# Patient Record
Sex: Female | Born: 1940 | Race: Black or African American | Hispanic: No | Smoking: Never smoker
Health system: Southern US, Community
[De-identification: ages and names within clinical notes are randomized; demographics above are authoritative.]

## PROBLEM LIST (undated history)

## (undated) DIAGNOSIS — H8110 Benign paroxysmal vertigo, unspecified ear: Secondary | ICD-10-CM

## (undated) DIAGNOSIS — E669 Obesity, unspecified: Secondary | ICD-10-CM

## (undated) DIAGNOSIS — K219 Gastro-esophageal reflux disease without esophagitis: Secondary | ICD-10-CM

## (undated) DIAGNOSIS — R718 Other abnormality of red blood cells: Secondary | ICD-10-CM

## (undated) DIAGNOSIS — I10 Essential (primary) hypertension: Secondary | ICD-10-CM

## (undated) DIAGNOSIS — M1990 Unspecified osteoarthritis, unspecified site: Secondary | ICD-10-CM

## (undated) DIAGNOSIS — I739 Peripheral vascular disease, unspecified: Secondary | ICD-10-CM

## (undated) DIAGNOSIS — E785 Hyperlipidemia, unspecified: Secondary | ICD-10-CM

## (undated) DIAGNOSIS — G4700 Insomnia, unspecified: Secondary | ICD-10-CM

## (undated) HISTORY — DX: Hyperlipidemia, unspecified: E78.5

## (undated) HISTORY — DX: Benign paroxysmal vertigo, unspecified ear: H81.10

## (undated) HISTORY — DX: Peripheral vascular disease, unspecified: I73.9

## (undated) HISTORY — DX: Essential (primary) hypertension: I10

## (undated) HISTORY — DX: Gastro-esophageal reflux disease without esophagitis: K21.9

## (undated) HISTORY — DX: Unspecified osteoarthritis, unspecified site: M19.90

## (undated) HISTORY — DX: Other abnormality of red blood cells: R71.8

## (undated) HISTORY — DX: Insomnia, unspecified: G47.00

## (undated) HISTORY — DX: Obesity, unspecified: E66.9

---

## 1969-04-22 HISTORY — PX: TOTAL ABDOMINAL HYSTERECTOMY: SHX209

## 1978-04-22 HISTORY — PX: ABLATION ON ENDOMETRIOSIS: SHX5787

## 1987-04-23 HISTORY — PX: CHOLECYSTECTOMY, LAPAROSCOPIC: SHX56

## 1997-08-03 ENCOUNTER — Ambulatory Visit (HOSPITAL_COMMUNITY): Admission: RE | Admit: 1997-08-03 | Discharge: 1997-08-03 | Payer: Self-pay | Admitting: Sports Medicine

## 1999-05-07 ENCOUNTER — Other Ambulatory Visit: Admission: RE | Admit: 1999-05-07 | Discharge: 1999-05-07 | Payer: Self-pay | Admitting: Gynecology

## 1999-06-27 ENCOUNTER — Encounter: Admission: RE | Admit: 1999-06-27 | Discharge: 1999-06-27 | Payer: Self-pay | Admitting: Sports Medicine

## 1999-06-27 ENCOUNTER — Encounter: Payer: Self-pay | Admitting: Sports Medicine

## 1999-07-05 ENCOUNTER — Ambulatory Visit (HOSPITAL_COMMUNITY): Admission: RE | Admit: 1999-07-05 | Discharge: 1999-07-05 | Payer: Self-pay | Admitting: Sports Medicine

## 1999-07-05 ENCOUNTER — Encounter: Payer: Self-pay | Admitting: Sports Medicine

## 2001-11-13 ENCOUNTER — Ambulatory Visit (HOSPITAL_COMMUNITY): Admission: RE | Admit: 2001-11-13 | Discharge: 2001-11-13 | Payer: Self-pay | Admitting: Gastroenterology

## 2001-11-13 ENCOUNTER — Encounter (INDEPENDENT_AMBULATORY_CARE_PROVIDER_SITE_OTHER): Payer: Self-pay | Admitting: Specialist

## 2002-10-11 ENCOUNTER — Encounter: Payer: Self-pay | Admitting: Allergy and Immunology

## 2002-10-11 ENCOUNTER — Encounter: Admission: RE | Admit: 2002-10-11 | Discharge: 2002-10-11 | Payer: Self-pay | Admitting: *Deleted

## 2002-10-15 ENCOUNTER — Encounter: Admission: RE | Admit: 2002-10-15 | Discharge: 2002-10-15 | Payer: Self-pay | Admitting: *Deleted

## 2002-10-15 ENCOUNTER — Encounter: Payer: Self-pay | Admitting: Allergy and Immunology

## 2002-11-02 ENCOUNTER — Other Ambulatory Visit: Admission: RE | Admit: 2002-11-02 | Discharge: 2002-11-02 | Payer: Self-pay | Admitting: Gynecology

## 2002-12-20 ENCOUNTER — Encounter: Payer: Self-pay | Admitting: Allergy and Immunology

## 2002-12-20 ENCOUNTER — Encounter: Admission: RE | Admit: 2002-12-20 | Discharge: 2002-12-20 | Payer: Self-pay | Admitting: *Deleted

## 2003-06-17 ENCOUNTER — Encounter: Admission: RE | Admit: 2003-06-17 | Discharge: 2003-06-17 | Payer: Self-pay | Admitting: Allergy and Immunology

## 2004-07-26 ENCOUNTER — Encounter: Admission: RE | Admit: 2004-07-26 | Discharge: 2004-07-26 | Payer: Self-pay | Admitting: Allergy and Immunology

## 2004-09-26 ENCOUNTER — Encounter: Admission: RE | Admit: 2004-09-26 | Discharge: 2004-09-26 | Payer: Self-pay | Admitting: Sports Medicine

## 2006-01-18 ENCOUNTER — Encounter: Admission: RE | Admit: 2006-01-18 | Discharge: 2006-01-18 | Payer: Self-pay | Admitting: Sports Medicine

## 2006-04-22 HISTORY — PX: COLONOSCOPY: SHX174

## 2006-07-07 ENCOUNTER — Encounter: Admission: RE | Admit: 2006-07-07 | Discharge: 2006-07-07 | Payer: Self-pay | Admitting: Sports Medicine

## 2008-03-27 ENCOUNTER — Encounter: Admission: RE | Admit: 2008-03-27 | Discharge: 2008-03-27 | Payer: Self-pay | Admitting: Sports Medicine

## 2011-04-23 HISTORY — PX: COLONOSCOPY: SHX174

## 2011-04-23 LAB — HM COLONOSCOPY

## 2013-08-04 ENCOUNTER — Encounter: Payer: Self-pay | Admitting: Neurology

## 2013-08-05 ENCOUNTER — Ambulatory Visit (INDEPENDENT_AMBULATORY_CARE_PROVIDER_SITE_OTHER): Payer: Medicare Other | Admitting: Neurology

## 2013-08-05 ENCOUNTER — Encounter: Payer: Self-pay | Admitting: Neurology

## 2013-08-05 VITALS — BP 132/78 | HR 61 | Temp 98.2°F | Ht 59.5 in | Wt 187.0 lb

## 2013-08-05 DIAGNOSIS — R0683 Snoring: Secondary | ICD-10-CM

## 2013-08-05 DIAGNOSIS — R0902 Hypoxemia: Secondary | ICD-10-CM

## 2013-08-05 DIAGNOSIS — R0609 Other forms of dyspnea: Secondary | ICD-10-CM

## 2013-08-05 DIAGNOSIS — G4726 Circadian rhythm sleep disorder, shift work type: Secondary | ICD-10-CM

## 2013-08-05 DIAGNOSIS — G4734 Idiopathic sleep related nonobstructive alveolar hypoventilation: Secondary | ICD-10-CM

## 2013-08-05 DIAGNOSIS — E669 Obesity, unspecified: Secondary | ICD-10-CM

## 2013-08-05 DIAGNOSIS — R0989 Other specified symptoms and signs involving the circulatory and respiratory systems: Secondary | ICD-10-CM

## 2013-08-05 DIAGNOSIS — R351 Nocturia: Secondary | ICD-10-CM

## 2013-08-05 NOTE — Patient Instructions (Signed)
Based on your symptoms and your exam I believe you are at risk for obstructive sleep apnea or OSA, and I think we should proceed with a sleep study to determine whether you do or do not have OSA and how severe it is. If you have more than mild OSA, I want you to consider treatment with CPAP. Please remember, the risks and ramifications of moderate to severe obstructive sleep apnea or OSA are: Cardiovascular disease, including congestive heart failure, stroke, difficult to control hypertension, arrhythmias, and even type 2 diabetes has been linked to untreated OSA. Sleep apnea causes disruption of sleep and sleep deprivation in most cases, which, in turn, can cause recurrent headaches, problems with memory, mood, concentration, focus, and vigilance. Most people with untreated sleep apnea report excessive daytime sleepiness, which can affect their ability to drive. Please do not drive if you feel sleepy.  I will see you back after your sleep study to go over the test results and where to go from there. We will call you after your sleep study and to set up an appointment at the time.   You can bring tylenol PM for your sleep study.

## 2013-08-05 NOTE — Progress Notes (Signed)
Subjective:    Patient ID: Crystal Preston is a 73 y.o. female.  HPI    Huston FoleySaima Herschel Fleagle, MD, PhD [facility] [address], [address] [address] [place], [place] [address]  Dear Dr. Eloise HarmanPaterson,   I saw your patient, Crystal Preston, upon your kind request in my neurologic clinic today for initial consultation of her sleep disorder, in particular, concern for obstructive sleep apnea. The patient is unaccompanied today. As you know, Crystal Preston is a very pleasant 73 year old right-handed woman with an underlying medical history of hyperlipidemia, osteoarthritis, reflux disease, chronic low back pain, positional vertigo, who complains of snoring, sleep disruption, nocturia, sleep consolidation problems and non-restorative sleep. She had a recent overnight pulse oximetry test on 07/12/2013 which I reviewed: Her baseline oxygen saturation on room air was 93.6%, her lowest oxygen saturation was 76%, time below 88% saturation was 6 minutes for the night. In reviewing the graph, it does appear that she had some cyclical desaturations.  Her typical bedtime is reported to be around 11:30 PM and usual wake time is around 10 AM. Sleep onset typically occurs after 1-2 hours. Of note, she used to work 3rd shift for 20 years, up until 2 years ago. 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 She does not smoke or drink alcohol. She lives alone and her husband of 22 years died some 9 years ago. She has 2 grown children, both local, from her first marriage (he died with a brain aneurysm rupture). She drives a car and has not had issues driving. She reports feeling marginally rested upon awakening. She wakes up on an average 2 times in the middle of the night and has to go to the bathroom 1 to 2 times on a typical night. She denies morning headaches.  She denies excessive daytime somnolence, but has physical tiredness and Her Epworth Sleepiness Score (ESS) is 3/24 today. She has not fallen  asleep while driving. The patient has not been taking a planned nap.  She has been known to snore for the past few years. Snoring is reportedly mild, and it is unclear if it is associated with choking sounds and witnessed apneas. The patient denies a sense of choking or strangling feeling. There is no report of nighttime reflux, with no nighttime cough experienced. The patient has noted occasional RLS symptoms and is not known to kick while asleep or before falling asleep. There is no family history of RLS or OSA.  She is a restless sleeper and in the morning, the bed is quite disheveled.   She denies cataplexy, sleep paralysis, hypnagogic or hypnopompic hallucinations, or sleep attacks. She does not report any vivid dreams, nightmares, dream enactments, or parasomnias, such as sleep talking or sleep walking. The patient has not had a sleep study or a home sleep test.  She consumes 1 caffeinated beverages per day, usually in the form of coffee.  Her bedroom is usually dark and cool, however, the TV stays on. She has no pets.   Her Past Medical History Is Significant For: Past Medical History  Diagnosis Date  . HTN (hypertension)   . Hyperlipidemia   . Osteoarthritis   . GERD (gastroesophageal reflux disease)   . Vertigo, benign positional     Head to left side  . Obesity, unspecified   . Peripheral vascular disease, unspecified   . Other abnormality of red blood cells     Microcytosis  . Insomnia, unspecified  Her Past Surgical History Is Significant For: Past Surgical History  Procedure Laterality Date  . Total abdominal hysterectomy  1971  . Ablation on endometriosis  1980  . Cholecystectomy, laparoscopic  1989  . Colonoscopy  2008    Her Family History Is Significant For: Family History  Problem Relation Age of Onset  . Cancer Father   . Cancer Mother     Her Social History Is Significant For: History   Social History  . Marital Status: Widowed    Spouse Name: N/A     Number of Children: N/A  . Years of Education: N/A   Social History Main Topics  . Smoking status: Never Smoker   . Smokeless tobacco: None  . Alcohol Use: No  . Drug Use: No  . Sexual Activity: None   Other Topics Concern  . None   Social History Narrative  . None    Her Allergies Are:  Allergies  Allergen Reactions  . Azithromycin Other (See Comments)    headache, excessive sleepiness  :   Her Current Medications Are:  Outpatient Encounter Prescriptions as of 08/05/2013  Medication Sig  . aspirin 81 MG tablet Take 81 mg by mouth daily.  . calcium-vitamin D (OSCAL WITH D) 500-200 MG-UNIT per tablet Take 2 tablets by mouth daily with breakfast.  . Cholecalciferol (VITAMIN D-3) 1000 UNITS CAPS Take 1 capsule by mouth daily.  . ferrous sulfate 325 (65 FE) MG EC tablet Take 325 mg by mouth daily with breakfast.  . ibandronate (BONIVA) 150 MG tablet Take 150 mg by mouth daily. Take in the morning with a full glass of water, on an empty stomach, and do not take anything else by mouth or lie down for the next 30 min.  Marland Kitchen losartan-hydrochlorothiazide (HYZAAR) 100-12.5 MG per tablet Take 1 tablet by mouth daily.  . meloxicam (MOBIC) 15 MG tablet Take 1 tablet by mouth daily.  . pantoprazole (PROTONIX) 40 MG tablet Take 40 mg by mouth daily.  . pravastatin (PRAVACHOL) 40 MG tablet Take 40 mg by mouth daily.  . Vitamin Mixture (ESTER-C) 500-60 MG TABS Take 1 tablet by mouth daily.  . diclofenac (VOLTAREN) 50 MG EC tablet Take 50 mg by mouth daily.  Marland Kitchen estradiol (ESTRACE) 0.5 MG tablet Take 0.5 mg by mouth daily.  :  Review of Systems:  Out of a complete 14 point review of systems, all are reviewed and negative with the exception of these symptoms as listed below:   Review of Systems  Constitutional: Negative.   HENT: Positive for tinnitus.   Eyes: Negative.   Respiratory: Negative.   Cardiovascular: Negative.   Gastrointestinal: Negative.   Endocrine: Positive for heat  intolerance.       Flushing  Genitourinary: Negative.   Musculoskeletal: Positive for arthralgias.  Skin: Negative.   Allergic/Immunologic: Negative.   Neurological: Negative.   Hematological: Negative.   Psychiatric/Behavioral: Negative.     Objective:  Neurologic Exam  Physical Exam Physical Examination:   Filed Vitals:   08/05/13 1338  BP: 132/78  Pulse: 61  Temp: 98.2 F (36.8 C)    General Examination: The patient is a very pleasant 73 y.o. female in no acute distress. She appears well-developed and well-nourished and well groomed. She is overweight.  HEENT: Normocephalic, atraumatic, pupils are equal, round and reactive to light and accommodation. Funduscopic exam is normal with sharp disc margins noted. Extraocular tracking is good without limitation to gaze excursion or nystagmus noted. Normal smooth pursuit is noted.  Hearing is grossly intact. Tympanic membranes are clear bilaterally. Face is symmetric with normal facial animation and normal facial sensation. Speech is clear with no dysarthria noted. There is no hypophonia. There is no lip, neck/head, jaw or voice tremor. Neck is supple with full range of passive and active motion. There are no carotid bruits on auscultation. Oropharynx exam reveals: mild mouth dryness, adequate dental hygiene on top and several missing on the bottom and moderate airway crowding, due to narrow airway entry and large tongue. Mallampati is class II. Tongue protrudes centrally and palate elevates symmetrically. Tonsils are absent. Neck size is 15.25 inches.   Chest: Clear to auscultation without wheezing, rhonchi or crackles noted.  Heart: S1+S2+0, regular and normal without murmurs, rubs or gallops noted.   Abdomen: Soft, non-tender and non-distended with normal bowel sounds appreciated on auscultation.  Extremities: There is no pitting edema in the distal lower extremities bilaterally. Pedal pulses are intact.  Skin: Warm and dry without  trophic changes noted. There are no varicose veins.  Musculoskeletal: exam reveals no obvious joint deformities, tenderness or joint swelling or erythema.   Neurologically:  Mental status: The patient is awake, alert and oriented in all 4 spheres. Her immediate and remote memory, attention, language skills and fund of knowledge are appropriate. There is no evidence of aphasia, agnosia, apraxia or anomia. Speech is clear with normal prosody and enunciation. Thought process is linear. Mood is normal and affect is normal.  Cranial nerves II - XII are as described above under HEENT exam. In addition: shoulder shrug is normal with equal shoulder height noted. Motor exam: Normal bulk, strength and tone is noted. There is no drift, tremor or rebound. Romberg is negative. Reflexes are 2+ throughout. Babinski: Toes are flexor bilaterally. Fine motor skills and coordination: intact with normal finger taps, normal hand movements, normal rapid alternating patting, normal foot taps and normal foot agility.  Cerebellar testing: No dysmetria or intention tremor on finger to nose testing. Heel to shin is unremarkable bilaterally. There is no truncal or gait ataxia.  Sensory exam: intact to light touch, pinprick, vibration, temperature sense in the upper and lower extremities.  Gait, station and balance: She stands easily. No veering to one side is noted. No leaning to one side is noted. Posture is age-appropriate and stance is narrow based. Gait shows normal stride length and normal pace. No problems turning are noted. She turns en bloc. Tandem walk is unremarkable. Intact toe and heel stance is noted.               Assessment and Plan:   In summary, Crystal Preston is a very pleasant 73 y.o.-year old female with an underlying medical history of hyperlipidemia, osteoarthritis, reflux disease, chronic low back pain, positional vertigo, with a history and physical exam as well as recent abnormal overnight pulse oximetry  test result concerning for obstructive sleep apnea (OSA).  I had a long chat with the patient about my findings and the diagnosis of OSA, its prognosis and treatment options. We talked about medical treatments, surgical interventions and non-pharmacological approaches. I explained in particular the risks and ramifications of untreated moderate to severe OSA, especially with respect to developing cardiovascular disease down the Road, including congestive heart failure, difficult to treat hypertension, cardiac arrhythmias, or stroke. Even type 2 diabetes has, in part, been linked to untreated OSA. Symptoms of untreated OSA include daytime sleepiness, memory problems, mood irritability and mood disorder such as depression and anxiety, lack of energy,  as well as recurrent headaches, especially morning headaches. We talked about trying to maintain a healthy lifestyle in general, as well as the importance of weight control. I encouraged the patient to eat healthy, exercise daily and keep well hydrated, to keep a scheduled bedtime and wake time routine, to not skip any meals and eat healthy snacks in between meals. I advised the patient not to drive when feeling sleepy. I recommended the following at this time: sleep study with potential positive airway pressure titration.  I explained the sleep test procedure to the patient and also outlined possible surgical and non-surgical treatment options of OSA, including the use of a custom-made dental device (which would require a referral to a specialist dentist or oral surgeon), upper airway surgical options, such as pillar implants, radiofrequency surgery, tongue base surgery, and UPPP (which would involve a referral to an ENT surgeon). Rarely, jaw surgery such as mandibular advancement may be considered.  I also explained the CPAP treatment option to the patient, who indicated that she would be willing (albeit reluctantly) to try CPAP if the need arises. I explained the  importance of being compliant with PAP treatment, not only for insurance purposes but primarily to improve Her symptoms, and for the patient's long term health benefit, including to reduce Her cardiovascular risks. I answered all her questions today and the patient was in agreement. I would like to see her back after the sleep study is completed and encouraged her to call with any interim questions, concerns, problems or updates.   Thank you very much for allowing me to participate in the care of this nice patient. If I can be of any further assistance to you please do not hesitate to call me at [phone].  Sincerely,   Huston FoleySaima Arther Heisler, MD, PhD

## 2014-12-29 LAB — HM MAMMOGRAPHY: HM MAMMO: ABNORMAL — AB (ref 0–4)

## 2015-07-04 ENCOUNTER — Institutional Professional Consult (permissible substitution): Payer: Self-pay | Admitting: Neurology

## 2016-02-13 ENCOUNTER — Telehealth (INDEPENDENT_AMBULATORY_CARE_PROVIDER_SITE_OTHER): Payer: Self-pay | Admitting: *Deleted

## 2016-02-13 NOTE — Telephone Encounter (Deleted)
Pt. Requesting medication refill of idandoronate sodium tablet. Call back number is [phone].

## 2016-02-13 NOTE — Telephone Encounter (Signed)
error 

## 2016-02-28 ENCOUNTER — Encounter: Payer: Self-pay | Admitting: Internal Medicine

## 2016-02-28 ENCOUNTER — Ambulatory Visit (INDEPENDENT_AMBULATORY_CARE_PROVIDER_SITE_OTHER): Payer: Medicare Other | Admitting: Internal Medicine

## 2016-02-28 VITALS — BP 130/74 | HR 70 | Temp 98.5°F | Ht 60.0 in | Wt 189.0 lb

## 2016-02-28 DIAGNOSIS — E2839 Other primary ovarian failure: Secondary | ICD-10-CM | POA: Diagnosis not present

## 2016-02-28 DIAGNOSIS — K219 Gastro-esophageal reflux disease without esophagitis: Secondary | ICD-10-CM | POA: Diagnosis not present

## 2016-02-28 DIAGNOSIS — E785 Hyperlipidemia, unspecified: Secondary | ICD-10-CM | POA: Diagnosis not present

## 2016-02-28 DIAGNOSIS — I10 Essential (primary) hypertension: Secondary | ICD-10-CM | POA: Insufficient documentation

## 2016-02-28 DIAGNOSIS — M150 Primary generalized (osteo)arthritis: Secondary | ICD-10-CM | POA: Diagnosis not present

## 2016-02-28 DIAGNOSIS — M810 Age-related osteoporosis without current pathological fracture: Secondary | ICD-10-CM

## 2016-02-28 DIAGNOSIS — Z23 Encounter for immunization: Secondary | ICD-10-CM | POA: Diagnosis not present

## 2016-02-28 DIAGNOSIS — M159 Polyosteoarthritis, unspecified: Secondary | ICD-10-CM | POA: Insufficient documentation

## 2016-02-28 LAB — LIPID PANEL
CHOLESTEROL: 164 mg/dL (ref <200)
HDL: 48 mg/dL — AB (ref >50)
LDL Cholesterol: 78 mg/dL
TRIGLYCERIDES: 191 mg/dL — AB (ref <150)
Total CHOL/HDL Ratio: 3.4 Ratio (ref <5.0)
VLDL: 38 mg/dL — ABNORMAL HIGH (ref <30)

## 2016-02-28 LAB — BASIC METABOLIC PANEL WITH GFR
BUN: 12 mg/dL (ref 7–25)
CO2: 28 mmol/L (ref 20–31)
Calcium: 9.8 mg/dL (ref 8.6–10.4)
Chloride: 101 mmol/L (ref 98–110)
Creat: 0.79 mg/dL (ref 0.60–0.93)
GFR, EST AFRICAN AMERICAN: 84 mL/min (ref >=60)
GFR, EST NON AFRICAN AMERICAN: 73 mL/min (ref >=60)
GLUCOSE: 79 mg/dL (ref 65–99)
POTASSIUM: 4.1 mmol/L (ref 3.5–5.3)
Sodium: 139 mmol/L (ref 135–146)

## 2016-02-28 LAB — ALT: ALT: 15 U/L (ref 6–29)

## 2016-02-28 MED ORDER — IBANDRONATE SODIUM 150 MG PO TABS: 150.0000 mg | ORAL_TABLET | ORAL | 3 refills | Status: DC

## 2016-02-28 NOTE — Progress Notes (Signed)
Patient ID: Crystal Preston, female   DOB: [birth year], 75 y.o.   MRN: [id number]    Location:  [facility] Place of Service: OFFICE    Advanced Directive information Does patient have an advance directive?: No, Would patient like information on creating an advanced directive?: No - patient declined information  Chief Complaint  Patient presents with  . Establish Care    New patient to establish care  . Flu Vaccine    requested    HPI:  75 yo female seen today as a new pt. She needs PCP closer to home. She was with previous PCP at least 10 yrs.   She reports prior to ARB, she had taken a medication that caused her lips and tongue swell. She states she has occasional tongue swelling now.  HTN - takes losartan HCT with Kdur. She is on ASA daily  Hyperlipidemia - takes pravastatin  Osteoporosis - takes monthly Boniva. Last DXA in 2014  Hypokalemia - diuretic induced. Takes Kdur  Anemia - takes iron supplement  GERD/hx duodenitis - takes protonix. Last colonoscopy in 09/2013  Tinnitus - chronic. Has had hearing eval and no significant loss. 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 She previously worked 3rd shift >20 yrs  Past Medical History:  Diagnosis Date  . GERD (gastroesophageal reflux disease)   . HTN (hypertension)   . Hyperlipidemia   . Insomnia, unspecified   . Obesity, unspecified   . Osteoarthritis   . Other abnormality of red blood cells    Microcytosis  . Peripheral vascular disease, unspecified   . Vertigo, benign positional    Head to left side    Past Surgical History:  Procedure Laterality Date  . ABLATION ON ENDOMETRIOSIS  1980  . CHOLECYSTECTOMY, LAPAROSCOPIC  1989  . COLONOSCOPY  2008  . COLONOSCOPY  2013  . TOTAL ABDOMINAL HYSTERECTOMY  1971    Patient Care Team: Leanna Battles, MD as PCP - General (Internal Medicine)  Social History    Social History  . Marital status: Widowed    Spouse name: N/A  . Number of children: 2  . Years of education: N/A   Occupational History  . [employer]    Social History Main Topics  . Smoking status: Never Smoker  . Smokeless tobacco: Not on file  . Alcohol use No  . Drug use: No  . Sexual activity: Not on file   Other Topics Concern  . Not on file   Social History Narrative   Diet:       Do you drink/ eat things with caffeine? Yes/Coffee      Marital status:  Widowed                             What year were you married ? 1007-1219      Do you live in a house, apartment,assistred living, condo, trailer, etc.)?Apt.      Is it one or more stories? 1      How many persons live in your home ? 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   Yes  Type & how often: Bike/Row 2-5 times a week      Do you have a living will? No      Do you have a DNR form?  No                     If not, do you want to discuss one? No      Do you have signed POA?HPOA forms?  No               If so, please bring to your        appointment        reports that she has never smoked. She does not have any smokeless tobacco history on file. She reports that she does not drink alcohol or use drugs.  Family History  Problem Relation Age of Onset  . Cancer Father 29  . Cancer Mother 51   Family Status  Relation Status  . Father Deceased at age 1  . Mother Deceased at age 29  . Brother Alive  . Sister Alive  . Sister Alive  . Sister Alive  . Son Alive  . Daughter Alive     There is no immunization history on file for this patient.  Allergies  Allergen Reactions  . Azithromycin Other (See Comments)    headache, excessive sleepiness  . Nitrofuran Derivatives Other (See Comments)    Knots on body    Medications: Patient's Medications  New Prescriptions   No  medications on file  Previous Medications   ASPIRIN 81 MG TABLET    Take 81 mg by mouth daily.   BIOFLAVONOID PRODUCTS (ESTER C PO)    Take 1 tablet (1000 mg ) by mouth once a day   CALCIUM CITRATE PO    Take 1 tablet (1000 mg ) by mouth daily   CHOLECALCIFEROL 4000 UNITS TABS    Take 1 tablet by mouth daily   COD LIVER OIL CAPS    Take 1 tablet by mouth daily   FERROUS SULFATE 325 (65 FE) MG EC TABLET    Take 325 mg by mouth daily with breakfast.   IBANDRONATE (BONIVA) 150 MG TABLET    Take 150 mg by mouth daily. Take in the morning with a full glass of water, on an empty stomach, and do not take anything else by mouth or lie down for the next 30 min.   LOSARTAN-HYDROCHLOROTHIAZIDE (HYZAAR) 100-12.5 MG PER TABLET    Take 1 tablet by mouth daily.   PANTOPRAZOLE (PROTONIX) 40 MG TABLET    Take 40 mg by mouth daily.   POTASSIUM CHLORIDE CRYS CR (KLOR-CON M20 PO)    Take once daily   PRAVASTATIN (PRAVACHOL) 40 MG TABLET    Take 40 mg by mouth daily.  Modified Medications   No medications on file  Discontinued Medications   No medications on file    Review of Systems  HENT: Positive for tinnitus.   Eyes:       Dry eyes; cats  Musculoskeletal: Positive for arthralgias and joint swelling (and stiffness).  Psychiatric/Behavioral: Positive for sleep disturbance.  All other systems reviewed and are negative.   Vitals:   02/28/16 1107  BP: 130/74  Pulse: 70  Temp: 98.5 F (36.9 C)  TempSrc: Oral  SpO2: 96%  Weight: 189 lb (85.7 kg)  Height: 5' (1.524 m)   Body mass index is 36.91 kg/m.  Physical Exam  Constitutional: She is oriented to person, place, and  time. She appears well-developed and well-nourished.  HENT:  Mouth/Throat: Oropharynx is clear and moist. No oropharyngeal exudate.  No angioedema noted  Eyes: Pupils are equal, round, and reactive to light. No scleral icterus.  Neck: Neck supple. Carotid bruit is not present. No tracheal deviation present.  Cardiovascular:  Normal rate, regular rhythm and intact distal pulses.  Exam reveals no gallop and no friction rub.   Murmur (1/6 SEM) heard. No LE edema b/l. no calf TTP.   Pulmonary/Chest: Effort normal and breath sounds normal. No stridor. No respiratory distress. She has no wheezes. She has no rales.  Abdominal: Soft. Bowel sounds are normal. She exhibits no distension and no mass. There is no hepatomegaly. There is no tenderness. There is no rebound and no guarding.  Musculoskeletal: She exhibits edema.  Lymphadenopathy:    She has no cervical adenopathy.  Neurological: She is alert and oriented to person, place, and time.  Skin: Skin is warm and dry. No rash noted.  Psychiatric: She has a normal mood and affect. Her behavior is normal. Thought content normal.     Labs reviewed: Office Visit on 02/28/2016  Component Date Value Ref Range Status  . HM Mammogram 12/29/2014 Self Reported Abnormal* 0-4 Bi-Rad, Self Reported Normal Final  . HM Colonoscopy 04/23/2011 Patient Reported  See Report (in chart), Patient Reported Final    No results found.   Assessment/Plan   ICD-9-CM ICD-10-CM   1. Essential hypertension 401.9 I10 BMP with eGFR     ALT  2. Age related osteoporosis, unspecified pathological fracture presence 733.01 M81.0 ibandronate (BONIVA) 150 MG tablet  3. Estrogen deficiency 256.39 E28.39 DG BONE DENSITY (DXA)  4. Primary osteoarthritis involving multiple joints 715.09 M15.0   5. Gastroesophageal reflux disease without esophagitis 530.81 K21.9   6. Hyperlipidemia, unspecified hyperlipidemia type 272.4 E78.5 Lipid Panel  7. Encounter for immunization Z23 Z23 Flu Vaccine QUAD 36+ mos IM   Will call with lab results  Will call with bone density appt  Please schedule mammogram  Continue current medications as ordered  Get old records  Follow up in Feb 2018 for CPE/ecg/mmse. Fasting labs prior to appt (cbc w diff, cmp, lipid panel, tsh, ua)     Shandrea Lusk S. Perlie Gold  [facility] [address] [place], [place] [address] [phone] Cell (Monday-Friday 8 AM - 5 PM) [phone] After 5 PM and follow prompts

## 2016-02-28 NOTE — Patient Instructions (Addendum)
Will call with lab results  Will call with bone density appt  Please schedule mammogram  Continue current medications as ordered  Follow up in Feb 2018 for CPE. Fasting labs prior to appt

## 2016-03-12 ENCOUNTER — Ambulatory Visit (INDEPENDENT_AMBULATORY_CARE_PROVIDER_SITE_OTHER): Payer: Medicare Other | Admitting: Nurse Practitioner

## 2016-03-12 ENCOUNTER — Encounter: Payer: Self-pay | Admitting: Nurse Practitioner

## 2016-03-12 VITALS — BP 124/78 | HR 75 | Temp 98.1°F | Ht 60.0 in | Wt 188.0 lb

## 2016-03-12 DIAGNOSIS — R300 Dysuria: Secondary | ICD-10-CM | POA: Diagnosis not present

## 2016-03-12 LAB — POCT URINALYSIS DIPSTICK
Bilirubin, UA: NEGATIVE
Glucose, UA: NEGATIVE
KETONES UA: NEGATIVE
NITRITE UA: POSITIVE
PH UA: 5
PROTEIN UA: NEGATIVE
Spec Grav, UA: <=1.005
UROBILINOGEN UA: 0.2

## 2016-03-12 MED ORDER — CEPHALEXIN 500 MG PO CAPS: 500.0000 mg | ORAL_CAPSULE | Freq: Two times a day (BID) | ORAL | 0 refills | Status: DC

## 2016-03-12 NOTE — Progress Notes (Signed)
Careteam: Patient Care Team: Jarome Matinaniel Paterson, MD as PCP - General (Internal Medicine)  Allergies  Allergen Reactions  . Azithromycin Other (See Comments)    headache, excessive sleepiness  . Nitrofuran Derivatives Other (See Comments)    Knots on body    Chief Complaint  Patient presents with  . Acute Visit    burning with urination     HPI: Patient is a 75 y.o. female seen in the office today due to burning with urination for a week an a half. Increase frequency and urgency. 3-4 times a night Feels well otherwise.  No abdominal pain.  No back pain.  No fever or chills.  Has had 3 UTI this year. Had them off and on during her life.   Having diarrhea which is new. Recently on amoxicillin due to tooth abscess and hydrocodone for pain. Having tooth removed next week.   Review of Systems:  Review of Systems  Constitutional: Negative for activity change, appetite change, chills, fatigue and fever.  Respiratory: Negative for shortness of breath.   Cardiovascular: Negative for chest pain.  Gastrointestinal: Positive for diarrhea. Negative for abdominal distention, abdominal pain and nausea.  Genitourinary: Positive for dysuria, frequency and urgency. Negative for hematuria and pelvic pain.    Past Medical History:  Diagnosis Date  . GERD (gastroesophageal reflux disease)   . HTN (hypertension)   . Hyperlipidemia   . Insomnia, unspecified   . Obesity, unspecified   . Osteoarthritis   . Other abnormality of red blood cells    Microcytosis  . Peripheral vascular disease, unspecified   . Vertigo, benign positional    Head to left side   Past Surgical History:  Procedure Laterality Date  . ABLATION ON ENDOMETRIOSIS  1980  . CHOLECYSTECTOMY, LAPAROSCOPIC  1989  . COLONOSCOPY  2008  . COLONOSCOPY  2013  . TOTAL ABDOMINAL HYSTERECTOMY  1971   Social History:   reports that she has never smoked. She has never used smokeless tobacco. She reports that she does not  drink alcohol or use drugs.  Family History  Problem Relation Age of Onset  . Cancer Father [phone]  . Cancer Mother [phone]    Medications: Patient's Medications  New Prescriptions   No medications on file  Previous Medications   ASPIRIN 81 MG TABLET    Take 81 mg by mouth daily.   BIOFLAVONOID PRODUCTS (ESTER C PO)    Take 1 tablet (1000 mg ) by mouth once a day   CALCIUM CITRATE PO    Take 1 tablet (1000 mg ) by mouth daily   CHOLECALCIFEROL 4000 UNITS TABS    Take 1 tablet by mouth daily   COD LIVER OIL CAPS    Take 1 tablet by mouth daily   FERROUS SULFATE 325 (65 FE) MG EC TABLET    Take 325 mg by mouth daily with breakfast.   IBANDRONATE (BONIVA) 150 MG TABLET    Take 1 tablet (150 mg total) by mouth every 30 (thirty) days. Take in the morning with a full glass of water, on an empty stomach, and do not take anything else by mouth or lie down for the next 30 min.   LOSARTAN-HYDROCHLOROTHIAZIDE (HYZAAR) 100-12.5 MG PER TABLET    Take 1 tablet by mouth daily.   PANTOPRAZOLE (PROTONIX) 40 MG TABLET    Take 40 mg by mouth daily.   POTASSIUM CHLORIDE CRYS CR (KLOR-CON M20 PO)    Take once daily   PRAVASTATIN (PRAVACHOL) 40  MG TABLET    Take 40 mg by mouth daily.  Modified Medications   No medications on file  Discontinued Medications   No medications on file     Physical Exam:  Vitals:   03/12/16 1121  BP: 124/78  Pulse: 75  Temp: 98.1 F (36.7 C)  TempSrc: Oral  SpO2: 93%  Weight: 188 lb (85.3 kg)  Height: 5' (1.524 m)   Body mass index is 36.72 kg/m.  Physical Exam  Constitutional: She is oriented to person, place, and time. She appears well-developed and well-nourished.  HENT:  Mouth/Throat: Oropharynx is clear and moist. No oropharyngeal exudate.  Eyes: Pupils are equal, round, and reactive to light.  Neck: Neck supple. Carotid bruit is not present.  Cardiovascular: Normal rate, regular rhythm and intact distal pulses.  Exam reveals no gallop and no friction rub.     Murmur (1/6 SEM) heard. Pulmonary/Chest: Effort normal and breath sounds normal. No respiratory distress.  Abdominal: Soft. Bowel sounds are normal. There is no hepatomegaly. There is tenderness (suprapubic).  Neurological: She is alert and oriented to person, place, and time.  Skin: Skin is warm and dry.  Psychiatric: She has a normal mood and affect.    Labs reviewed: Basic Metabolic Panel:  Recent Labs  96/07/5409/08/17 1223  NA 139  K 4.1  CL 101  CO2 28  GLUCOSE 79  BUN 12  CREATININE 0.79  CALCIUM 9.8   Liver Function Tests:  Recent Labs  02/28/16 1223  ALT 15   No results for input(s): LIPASE, AMYLASE in the last 8760 hours. No results for input(s): AMMONIA in the last 8760 hours. CBC: No results for input(s): WBC, NEUTROABS, HGB, HCT, MCV, PLT in the last 8760 hours. Lipid Panel:  Recent Labs  02/28/16 1223  CHOL 164  HDL 48*  LDLCALC 78  TRIG [id number]*  CHOLHDL 3.4   TSH: No results for input(s): TSH in the last 8760 hours. A1C: No results found for: HGBA1C  Urinalysis    Component Value Date/Time   BILIRUBINUR Neg 03/12/2016 1125   PROTEINUR Neg 03/12/2016 1125   UROBILINOGEN 0.2 03/12/2016 1125   NITRITE Pos 03/12/2016 1125   LEUKOCYTESUR moderate (2+) (A) 03/12/2016 1125     Assessment/Plan 1. Dysuria - encouraged hydration  - POC Urinalysis Dipstick abnormal therefore sent for culture - will start cephALEXin (KEFLEX) 500 MG capsule; Take 1 capsule (500 mg total) by mouth 2 (two) times daily.  Dispense: 14 capsule; Refill: 0 while awaiting culture -to take probiotic by mouth twice daily for 14 days    Adriane Guglielmo K. Biagio BorgEubanks, AGNP  [facility] [phone](Monday-Friday 8 am - 5 pm) [phone] (after hours)

## 2016-03-12 NOTE — Patient Instructions (Addendum)
To take florastor (probiotic) twice daily for 2 weeks  To start keflex 500 mg twice daily for 1 week We will also send urine for culture.   Follow these instructions at home: Medicines  Take over-the-counter and prescription medicines only as told by your health care provider.  If you were prescribed an antibiotic medicine, take it as told by your health care provider. Do not stop taking the antibiotic even if you start to feel better. General instructions  Drink enough fluid to keep your urine clear or pale yellow.  Avoid caffeine, tea, and carbonated beverages. They tend to irritate the bladder.  Urinate often. Avoid holding in urine for long periods of time.  Urinate before and after sex.  After a bowel movement, women should cleanse from front to back. Use each tissue only once.  Keep all follow-up visits as told by your health care provider. This is important. Contact a health care provider if:  Your symptoms do not get better after 2 days of treatment.  Your symptoms get worse.  You have a fever. Get help right away if:  You are unable to take your antibiotics or fluids.  You have shaking chills.  You vomit.  You have severe flank or back pain.  You have extreme weakness or fainting. This information is not intended to replace advice given to you by your health care provider. Make sure you discuss any questions you have with your health care provider. Document Released: 04/08/2005 Document Revised: 09/14/2015 Document Reviewed: 08/01/2014  2017 Elsevier

## 2016-03-14 LAB — URINE CULTURE

## 2016-03-22 ENCOUNTER — Ambulatory Visit
Admission: RE | Admit: 2016-03-22 | Discharge: 2016-03-22 | Disposition: A | Discharge status: Home / Self Care | Payer: Self-pay | Source: Ambulatory Visit | Attending: Internal Medicine | Admitting: Internal Medicine

## 2016-03-22 DIAGNOSIS — M8589 Other specified disorders of bone density and structure, multiple sites: Secondary | ICD-10-CM | POA: Diagnosis not present

## 2016-03-22 DIAGNOSIS — Z780 Asymptomatic menopausal state: Secondary | ICD-10-CM | POA: Diagnosis not present

## 2016-03-22 DIAGNOSIS — E2839 Other primary ovarian failure: Secondary | ICD-10-CM

## 2016-04-08 ENCOUNTER — Telehealth: Payer: Self-pay | Admitting: *Deleted

## 2016-04-08 DIAGNOSIS — Z1231 Encounter for screening mammogram for malignant neoplasm of breast: Secondary | ICD-10-CM | POA: Diagnosis not present

## 2016-04-08 LAB — HM MAMMOGRAPHY

## 2016-04-08 NOTE — Telephone Encounter (Signed)
Patient called and Left message stated that she was having "Urinary Tract Problems" I called back and left message for her to return call, was going to offer her an appointment with Shanda BumpsJessica in the morning.

## 2016-04-09 ENCOUNTER — Ambulatory Visit (INDEPENDENT_AMBULATORY_CARE_PROVIDER_SITE_OTHER): Payer: Medicare Other | Admitting: Nurse Practitioner

## 2016-04-09 ENCOUNTER — Encounter: Payer: Self-pay | Admitting: Nurse Practitioner

## 2016-04-09 VITALS — BP 126/78 | HR 69 | Temp 98.0°F | Resp 17 | Ht 60.0 in | Wt 187.4 lb

## 2016-04-09 DIAGNOSIS — R300 Dysuria: Secondary | ICD-10-CM | POA: Diagnosis not present

## 2016-04-09 LAB — POCT URINALYSIS DIPSTICK
BILIRUBIN UA: NEGATIVE
GLUCOSE UA: NEGATIVE
Ketones, UA: NEGATIVE
Leukocytes, UA: NEGATIVE
NITRITE UA: NEGATIVE
Protein, UA: NEGATIVE
RBC UA: NEGATIVE
Spec Grav, UA: 1.01
UROBILINOGEN UA: 0.2
pH, UA: 7.5

## 2016-04-09 NOTE — Progress Notes (Signed)
Careteam: Patient Care Team: Kirt BoysMonica Carter, DO as PCP - General (Internal Medicine)  Advanced Directive information Does Patient Have a Medical Advance Directive?: No  Allergies  Allergen Reactions  . Azithromycin Other (See Comments)    headache, excessive sleepiness  . Nitrofuran Derivatives Other (See Comments)    Knots on body    Chief Complaint  Patient presents with  . Acute Visit    Urinary frequency and bilateral hand pain while urinating.     HPI: Patient is a 75 y.o. female seen in the office today with c/o urinary frequency that began 2 days ago. She also has urgency, burning, and a feeling that electricity is in her hands. The feeling in her hands begins when she pees and goes away when she finishes; it is not felt at any other time. At times she draws her knees up as she has a sensation in her pelvic region that just "feels like something is there" that is not pain or pressure. The patient was placed on Amoxicillin x1 week and was finished with the entire course 3 days ago in preparation for an upcoming dental procedure. Denies fever or chills. She does have cramps first thing in the morning on the right side that radiates around to her back that resolves quickly once she gets up moving around.    Review of Systems:  Review of Systems  Constitutional: Negative for chills, fatigue and fever.  Gastrointestinal: Negative for abdominal pain, constipation, diarrhea, nausea and vomiting.  Genitourinary: Positive for dysuria, flank pain, frequency and urgency. Negative for genital sores, vaginal discharge and vaginal pain.    Past Medical History:  Diagnosis Date  . GERD (gastroesophageal reflux disease)   . HTN (hypertension)   . Hyperlipidemia   . Insomnia, unspecified   . Obesity, unspecified   . Osteoarthritis   . Other abnormality of red blood cells    Microcytosis  . Peripheral vascular disease, unspecified   . Vertigo, benign positional    Head to left  side   Past Surgical History:  Procedure Laterality Date  . ABLATION ON ENDOMETRIOSIS  1980  . CHOLECYSTECTOMY, LAPAROSCOPIC  1989  . COLONOSCOPY  2008  . COLONOSCOPY  2013  . TOTAL ABDOMINAL HYSTERECTOMY  1971   Social History:   reports that she has never smoked. She has never used smokeless tobacco. She reports that she does not drink alcohol or use drugs.  Family History  Problem Relation Age of Onset  . Cancer Father [phone]  . Cancer Mother [phone]    Medications: Patient's Medications  New Prescriptions   No medications on file  Previous Medications   ASPIRIN 81 MG TABLET    Take 81 mg by mouth daily.   BIOFLAVONOID PRODUCTS (ESTER C PO)    Take 1 tablet (1000 mg ) by mouth once a day   CALCIUM CITRATE PO    Take 1 tablet (1000 mg ) by mouth daily   CHOLECALCIFEROL 4000 UNITS TABS    Take 1 tablet by mouth daily   COD LIVER OIL CAPS    Take 1 tablet by mouth daily   FERROUS SULFATE 325 (65 FE) MG EC TABLET    Take 325 mg by mouth daily with breakfast.   IBANDRONATE (BONIVA) 150 MG TABLET    Take 1 tablet (150 mg total) by mouth every 30 (thirty) days. Take in the morning with a full glass of water, on an empty stomach, and do not take anything else by  mouth or lie down for the next 30 min.   LOSARTAN-HYDROCHLOROTHIAZIDE (HYZAAR) 100-12.5 MG PER TABLET    Take 1 tablet by mouth daily.   PANTOPRAZOLE (PROTONIX) 40 MG TABLET    Take 40 mg by mouth daily.   POTASSIUM CHLORIDE CRYS CR (KLOR-CON M20 PO)    Take once daily   PRAVASTATIN (PRAVACHOL) 40 MG TABLET    Take 40 mg by mouth daily.  Modified Medications   No medications on file  Discontinued Medications   CEPHALEXIN (KEFLEX) 500 MG CAPSULE    Take 1 capsule (500 mg total) by mouth 2 (two) times daily.     Physical Exam:  Vitals:   04/09/16 1456  BP: 126/78  Pulse: 69  Resp: 17  Temp: 98 F (36.7 C)  TempSrc: Oral  SpO2: 97%  Weight: 187 lb 6.4 oz (85 kg)  Height: 5' (1.524 m)   Body mass index is 36.6  kg/m.  Physical Exam  Constitutional: She is oriented to person, place, and time. She appears well-developed and well-nourished.  HENT:  Head: Normocephalic and atraumatic.  Cardiovascular: Normal rate, regular rhythm and normal heart sounds.   Pulmonary/Chest: Effort normal and breath sounds normal.  Abdominal: Soft. Bowel sounds are normal. She exhibits no distension and no mass. There is tenderness (suprapubic area). There is no rebound and no guarding.  Genitourinary: Vagina normal. No vaginal discharge found.  Musculoskeletal: Normal range of motion.  Neurological: She is alert and oriented to person, place, and time.  Skin: Skin is warm and dry.    Labs reviewed: Basic Metabolic Panel:  Recent Labs  16/01/9610/08/17 1223  NA 139  K 4.1  CL 101  CO2 28  GLUCOSE 79  BUN 12  CREATININE 0.79  CALCIUM 9.8   Liver Function Tests:  Recent Labs  02/28/16 1223  ALT 15   No results for input(s): LIPASE, AMYLASE in the last 8760 hours. No results for input(s): AMMONIA in the last 8760 hours. CBC: No results for input(s): WBC, NEUTROABS, HGB, HCT, MCV, PLT in the last 8760 hours. Lipid Panel:  Recent Labs  02/28/16 1223  CHOL 164  HDL 48*  LDLCALC 78  TRIG 045191*  CHOLHDL 3.4   TSH: No results for input(s): TSH in the last 8760 hours. A1C: No results found for: HGBA1C   Assessment/Plan 1. Dysuria - increase water intake - Apply lubricant ([place] Jelly) to vaginal area BID - OTC phenazopyridine 100 mg PO TID after meals x2 days as needed for burning with urination.  - POCT urinalysis dipstick negative however with signs of UTI - Culture, Urine - note that patient has recently completed a 7 day course of amoxicillin    Retina Bernardy K. Biagio BorgEubanks, AGNP  [facility] [phone](Monday-Friday 8 am - 5 pm) [phone] (after hours)

## 2016-04-09 NOTE — Patient Instructions (Addendum)
We will send your urine off to be cultured.  Drink lots of water.   Lubricant (KY Jelly) to vaginal area twice a day.  May get over the counter phenazopyridine 100 mg by mouth three times daily after meals- only take for 2 days

## 2016-04-11 LAB — URINE CULTURE

## 2016-04-11 MED ORDER — CIPROFLOXACIN HCL 500 MG PO TABS: 500.0000 mg | ORAL_TABLET | Freq: Two times a day (BID) | ORAL | 0 refills | Status: DC

## 2016-04-11 NOTE — Addendum Note (Signed)
Addended by: Chriss DriverLANE, Allan Bacigalupi L on: 04/11/2016 01:13 PM   Modules accepted: Orders

## 2016-04-26 ENCOUNTER — Telehealth: Payer: Self-pay | Admitting: Internal Medicine

## 2016-04-26 NOTE — Telephone Encounter (Signed)
Left msg asking pt to confirm this AWV appt w/ nurse. VDM (DD) °

## 2016-05-24 ENCOUNTER — Ambulatory Visit (INDEPENDENT_AMBULATORY_CARE_PROVIDER_SITE_OTHER): Payer: Medicare Other

## 2016-05-24 VITALS — BP 130/78 | HR 79 | Temp 98.6°F | Ht 60.0 in | Wt 191.2 lb

## 2016-05-24 DIAGNOSIS — Z0000 Encounter for general adult medical examination without abnormal findings: Secondary | ICD-10-CM

## 2016-05-24 DIAGNOSIS — Z23 Encounter for immunization: Secondary | ICD-10-CM

## 2016-05-24 NOTE — Progress Notes (Signed)
Subjective:   Crystal Preston is a 76 y.o. female who presents for an Initial Medicare Annual Wellness Visit.  Review of Systems     Cardiac Risk Factors include: advanced age (>6755men, 61>65 women);hypertension;obesity (BMI >30kg/m2)     Objective:    Today's Vitals   05/24/16 1327  BP: 130/78  Pulse: 79  Temp: 98.6 F (37 C)  TempSrc: Oral  SpO2: 98%  Weight: 191 lb 3.2 oz (86.7 kg)  Height: 5' (1.524 m)   Body mass index is 37.34 kg/m.   Current Medications (verified) Outpatient Encounter Prescriptions as of 05/24/2016  Medication Sig  . aspirin 81 MG tablet Take 81 mg by mouth daily.  Marland Kitchen. Bioflavonoid Products (ESTER C PO) Take 1 tablet (1000 mg ) by mouth once a day  . CALCIUM CITRATE PO Take 1 tablet (1000 mg ) by mouth daily  . Cholecalciferol 4000 units TABS Take 1 tablet by mouth daily  . Cod Liver Oil CAPS Take 1 tablet by mouth daily  . ferrous sulfate 325 (65 FE) MG EC tablet Take 325 mg by mouth daily with breakfast.  . ibandronate (BONIVA) 150 MG tablet Take 1 tablet (150 mg total) by mouth every 30 (thirty) days. Take in the morning with a full glass of water, on an empty stomach, and do not take anything else by mouth or lie down for the next 30 min.  Marland Kitchen. losartan-hydrochlorothiazide (HYZAAR) 100-12.5 MG per tablet Take 1 tablet by mouth daily.  . pantoprazole (PROTONIX) 40 MG tablet Take 40 mg by mouth daily.  . Potassium Chloride Crys CR (KLOR-CON M20 PO) Take once daily  . pravastatin (PRAVACHOL) 40 MG tablet Take 40 mg by mouth daily.  . [DISCONTINUED] ciprofloxacin (CIPRO) 500 MG tablet Take 1 tablet (500 mg total) by mouth 2 (two) times daily. For 7 days.   No facility-administered encounter medications on file as of 05/24/2016.     Allergies (verified) Azithromycin and Nitrofuran derivatives   History: Past Medical History:  Diagnosis Date  . GERD (gastroesophageal reflux disease)   . HTN (hypertension)   . Hyperlipidemia   . Insomnia, unspecified     . Obesity, unspecified   . Osteoarthritis   . Other abnormality of red blood cells    Microcytosis  . Peripheral vascular disease, unspecified   . Vertigo, benign positional    Head to left side   Past Surgical History:  Procedure Laterality Date  . ABLATION ON ENDOMETRIOSIS  1980  . CHOLECYSTECTOMY, LAPAROSCOPIC  1989  . COLONOSCOPY  2008  . COLONOSCOPY  2013  . TOTAL ABDOMINAL HYSTERECTOMY  1971   Family History  Problem Relation Age of Onset  . Cancer Father 6373  . Cancer Mother [phone]   Social History   Occupational History  . [employer]    Social History Main Topics  . Smoking status: Never Smoker  . Smokeless tobacco: Never Used  . Alcohol use No  . Drug use: No  . Sexual activity: Not Currently    Tobacco Counseling Counseling given: No   Activities of Daily Living In your present state of health, do you have any difficulty performing the following activities: 05/24/2016  Hearing? N  Vision? N  Difficulty concentrating or making decisions? N  Walking or climbing stairs? N  Dressing or bathing? N  Doing errands, shopping? Y  Preparing Food and eating ? N  Using the Toilet? N  In the past six months, have you accidently leaked urine? Y  Do  you have problems with loss of bowel control? N  Managing your Medications? N  Managing your Finances? N  Housekeeping or managing your Housekeeping? N  Some recent data might be hidden    Immunizations and Health Maintenance Immunization History  Administered Date(s) Administered  . Influenza,inj,Quad PF,36+ Mos 02/28/2016  . Pneumococcal Conjugate-13 05/24/2016   There are no preventive care reminders to display for this patient.  Patient Care Team: Kirt Boys, DO as PCP - General (Internal Medicine) Elise Benne, MD as Consulting Physician (Ophthalmology)  Indicate any recent Medical Services you may have received from other than [facility] providers in the past year (date may be approximate).     Assessment:    This is a routine wellness examination for Crystal Preston. Hearing/Vision screen Hearing Screening Comments: Pt has not had a hearing screen in the past several years. 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 Vision Screening Comments: Last eye exam was done in Feb 2017 with Dr. Elise Benne. Pt to make an appt this month for annual exam.   Dietary issues and exercise activities discussed: Current Exercise Habits: Structured exercise class, Type of exercise: strength training/weights;walking, Time (Minutes): 60, Frequency (Times/Week): 5, Weekly Exercise (Minutes/Week): 300, Intensity: Moderate  Goals    . <enter goal here>          Starting 05/24/16, I will maintain my current lifestyle.       Depression Screen PHQ 2/9 Scores 05/24/2016 02/28/2016  PHQ - 2 Score 0 0    Fall Risk Fall Risk  05/24/2016 04/09/2016 03/12/2016 02/28/2016  Falls in the past year? No No No No    Cognitive Function: MMSE - Mini Mental State Exam 05/24/2016  Orientation to time 5  Orientation to Place 5  Registration 3  Attention/ Calculation 5  Recall 3  Language- name 2 objects 2  Language- repeat 1  Language- follow 3 step command 2  Language- read & follow direction 1  Write a sentence 1  Copy design 0  Total score 28        Screening Tests Health Maintenance  Topic Date Due  . ZOSTAVAX  05/23/2018 (Originally 11/02/1999)  . TETANUS/TDAP  05/23/2018 (Originally 11/02/1959)  . PNA vac Low Risk Adult (2 of 2 - PPSV23) 05/24/2017  . INFLUENZA VACCINE  Completed  . DEXA SCAN  Completed      Plan:    I have personally reviewed and addressed the Medicare Annual Wellness questionnaire and have noted the following in the patient's chart:  A. Medical and social history B. Use of alcohol, tobacco or illicit drugs  C. Current medications and supplements D. Functional ability and status E.  Nutritional status F.  Physical activity G. Advance directives H. List of other physicians I.    Hospitalizations, surgeries, and ER visits in previous 12 months J.  Vitals K. Screenings to include hearing, vision, cognitive, depression L. Referrals and appointments - none  In addition, I have reviewed and discussed with patient certain preventive protocols, quality metrics, and best practice recommendations. A written personalized care plan for preventive services as well as general preventive health recommendations were provided to patient.  See attached scanned questionnaire for additional information.   Signed,   Nilda Calamity, LPN Health Advisor    I have reviewed the health advisor's note and was available for consultation. I agree with documentation and plan.   Rocko Fesperman S. [facility]  [place], [place] [address] [phone] Cell (Monday-Friday 8 AM - 5 PM) [phone] After 5 PM and follow prompts

## 2016-05-24 NOTE — Patient Instructions (Addendum)
[name] , Thank you for taking time to come for your Medicare Wellness Visit. I appreciate your ongoing commitment to your health goals. Please review the following plan we discussed and let me know if I can assist you in the future.   These are the goals we discussed: Goals    . <enter goal here>          Starting 05/24/16, I will maintain my current lifestyle.        This is a list of the screening recommended for you and due dates:  Health Maintenance  Topic Date Due  . Shingles Vaccine  05/23/2018*  . Tetanus Vaccine  05/23/2018*  . Pneumonia vaccines (2 of 2 - PPSV23) 05/24/2017  . Flu Shot  Completed  . DEXA scan (bone density measurement)  Completed  *Topic was postponed. The date shown is not the original due date.   Preventive Care for Adults  A healthy lifestyle and preventive care can promote health and wellness. Preventive health guidelines for adults include the following key practices.  . A routine yearly physical is a good way to check with your health care provider about your health and preventive screening. It is a chance to share any concerns and updates on your health and to receive a thorough exam.  . Visit your dentist for a routine exam and preventive care every 6 months. Brush your teeth twice a day and floss once a day. Good oral hygiene prevents tooth decay and gum disease.  . The frequency of eye exams is based on your age, health, family medical history, use  of contact lenses, and other factors. Follow your health care provider's ecommendations for frequency of eye exams.  . Eat a healthy diet. Foods like vegetables, fruits, whole grains, low-fat dairy products, and lean protein foods contain the nutrients you need without too many calories. Decrease your intake of foods high in solid fats, added sugars, and salt. Eat the right amount of calories for you. Get information about a proper diet from your health care provider, if necessary.  . Regular  physical exercise is one of the most important things you can do for your health. Most adults should get at least 150 minutes of moderate-intensity exercise (any activity that increases your heart rate and causes you to sweat) each week. In addition, most adults need muscle-strengthening exercises on 2 or more days a week.  Silver Sneakers may be a benefit available to you. To determine eligibility, you may visit the website: www.silversneakers.com or contact program at [phone] Mon-Fri between 8AM-8PM.   . Maintain a healthy weight. The body mass index (BMI) is a screening tool to identify possible weight problems. It provides an estimate of body fat based on height and weight. Your health care provider can find your BMI and can help you achieve or maintain a healthy weight.   For adults 20 years and older: ? A BMI below 18.5 is considered underweight. ? A BMI of 18.5 to 24.9 is normal. ? A BMI of 25 to 29.9 is considered overweight. ? A BMI of 30 and above is considered obese.   . Maintain normal blood lipids and cholesterol levels by exercising and minimizing your intake of saturated fat. Eat a balanced diet with plenty of fruit and vegetables. Blood tests for lipids and cholesterol should begin at age 77 and be repeated every 5 years. If your lipid or cholesterol levels are high, you are over 50, or you are at high risk  for heart disease, you may need your cholesterol levels checked more frequently. Ongoing high lipid and cholesterol levels should be treated with medicines if diet and exercise are not working.  . If you smoke, find out from your health care provider how to quit. If you do not use tobacco, please do not start.  . If you choose to drink alcohol, please do not consume more than 2 drinks per day. One drink is considered to be 12 ounces (355 mL) of beer, 5 ounces (148 mL) of wine, or 1.5 ounces (44 mL) of liquor.  . If you are 80-7 years old, ask your health care provider if  you should take aspirin to prevent strokes.  . Use sunscreen. Apply sunscreen liberally and repeatedly throughout the day. You should seek shade when your shadow is shorter than you. Protect yourself by wearing long sleeves, pants, a wide-brimmed hat, and sunglasses year round, whenever you are outdoors.  . Once a month, do a whole body skin exam, using a mirror to look at the skin on your back. Tell your health care provider of new moles, moles that have irregular borders, moles that are larger than a pencil eraser, or moles that have changed in shape or color.

## 2016-05-24 NOTE — Progress Notes (Signed)
Quick Notes   Health Maintenance:  Pn13 given today; pt to ask pharmacy about OOP expense for TDAP and Zosta.     Abnormal Screen: None; MMSE-28/30 Failed Clock Test    Patient Concerns:  Pt thinks she might have an ear infection; having pain in both ears. No fevers.     Nurse Concerns:  None

## 2016-05-27 ENCOUNTER — Telehealth: Payer: Self-pay | Admitting: *Deleted

## 2016-05-27 NOTE — Telephone Encounter (Signed)
Doubt her current symptoms are related to recent prvnar vaccine. She likely has a cold. Recommend OTC coricidan HBP for cough and congestion. Continue other medications as ordered. Push fluids. Call back if not feeling better in next 48-72 hrs

## 2016-05-27 NOTE — Telephone Encounter (Signed)
Patient notified and agreed.  

## 2016-05-27 NOTE — Telephone Encounter (Signed)
Patient called and stated that she got a pneumonia shot on Friday and now she has congestion, runny nose and cough, No fever. Not coughing up any congestion but the coughing is making her chest sore. Would like something to help ease the cough. Please Advise.

## 2016-05-29 MED ORDER — METHYLPREDNISOLONE 4 MG PO TBPK: ORAL_TABLET | ORAL | 0 refills | Status: DC

## 2016-05-29 NOTE — Telephone Encounter (Signed)
Patient called back today and stated that she is no better. She has tried the Coricidan HBP with no relief. Still has a bad non productive cough, congestion and now hoarse. Doesn't feel like she has a fever. Wonders if something can be called in to help clear this up. No available appointments. Please Advise.

## 2016-05-29 NOTE — Telephone Encounter (Signed)
Ok for medrol dose pak #1 take as directed no RF

## 2016-05-29 NOTE — Telephone Encounter (Signed)
Patient notified and agreed. Rx faxed to pharmacy.  

## 2016-05-29 NOTE — Addendum Note (Signed)
Addended by: Nelda SevereMAY, ANITA A on: 05/29/2016 04:41 PM   Modules accepted: Orders

## 2016-06-05 ENCOUNTER — Other Ambulatory Visit: Payer: Medicare Other

## 2016-06-05 DIAGNOSIS — I10 Essential (primary) hypertension: Secondary | ICD-10-CM

## 2016-06-05 DIAGNOSIS — E785 Hyperlipidemia, unspecified: Secondary | ICD-10-CM

## 2016-06-05 LAB — COMPLETE METABOLIC PANEL WITH GFR
ALBUMIN: 3.9 g/dL (ref 3.6–5.1)
ALK PHOS: 65 U/L (ref 33–130)
ALT: 24 U/L (ref 6–29)
AST: 20 U/L (ref 10–35)
BUN: 8 mg/dL (ref 7–25)
CO2: 27 mmol/L (ref 20–31)
Calcium: 9.6 mg/dL (ref 8.6–10.4)
Chloride: 97 mmol/L — ABNORMAL LOW (ref 98–110)
Creat: 0.72 mg/dL (ref 0.60–0.93)
GFR, Est African American: >89 mL/min (ref >=60)
GFR, Est Non African American: 82 mL/min (ref >=60)
GLUCOSE: 85 mg/dL (ref 65–99)
POTASSIUM: 3.8 mmol/L (ref 3.5–5.3)
SODIUM: 137 mmol/L (ref 135–146)
TOTAL PROTEIN: 7.3 g/dL (ref 6.1–8.1)
Total Bilirubin: 0.4 mg/dL (ref 0.2–1.2)

## 2016-06-05 LAB — CBC WITH DIFFERENTIAL/PLATELET
Basophils Absolute: 0 cells/uL (ref 0–200)
Basophils Relative: 0 %
EOS PCT: 2 %
Eosinophils Absolute: 220 cells/uL (ref 15–500)
HCT: 37 % (ref 35.0–45.0)
Hemoglobin: 11.5 g/dL — ABNORMAL LOW (ref 11.7–15.5)
LYMPHS ABS: 3300 {cells}/uL (ref 850–3900)
Lymphocytes Relative: 30 %
MCH: 22.7 pg — ABNORMAL LOW (ref 27.0–33.0)
MCHC: 31.1 g/dL — AB (ref 32.0–36.0)
MCV: 73.1 fL — ABNORMAL LOW (ref 80.0–100.0)
MPV: 8.6 fL (ref 7.5–12.5)
Monocytes Absolute: 990 cells/uL — ABNORMAL HIGH (ref 200–950)
Monocytes Relative: 9 %
NEUTROS ABS: 6490 {cells}/uL (ref 1500–7800)
Neutrophils Relative %: 59 %
Platelets: 449 10*3/uL — ABNORMAL HIGH (ref 140–400)
RBC: 5.06 MIL/uL (ref 3.80–5.10)
RDW: 14.5 % (ref 11.0–15.0)
WBC: 11 10*3/uL — AB (ref 3.8–10.8)

## 2016-06-05 LAB — LIPID PANEL
CHOL/HDL RATIO: 3.1 ratio (ref <5.0)
Cholesterol: 128 mg/dL (ref <200)
HDL: 41 mg/dL — ABNORMAL LOW (ref >50)
LDL CALC: 65 mg/dL (ref <100)
Triglycerides: 110 mg/dL (ref <150)
VLDL: 22 mg/dL (ref <30)

## 2016-06-06 LAB — URINALYSIS, MICROSCOPIC ONLY
Casts: NONE SEEN [LPF]
Crystals: NONE SEEN [HPF]
YEAST: NONE SEEN [HPF]

## 2016-06-06 LAB — URINALYSIS, ROUTINE W REFLEX MICROSCOPIC
BILIRUBIN URINE: NEGATIVE
Glucose, UA: NEGATIVE
HGB URINE DIPSTICK: NEGATIVE
KETONES UR: NEGATIVE
Nitrite: NEGATIVE
PROTEIN: NEGATIVE
Specific Gravity, Urine: 1.004 (ref 1.001–1.035)
pH: 7 (ref 5.0–8.0)

## 2016-06-06 LAB — TSH: TSH: 0.96 m[IU]/L

## 2016-06-07 ENCOUNTER — Ambulatory Visit: Payer: Medicare Other

## 2016-06-07 ENCOUNTER — Encounter: Payer: Medicare Other | Admitting: Internal Medicine

## 2016-06-10 ENCOUNTER — Other Ambulatory Visit: Payer: Self-pay | Admitting: Internal Medicine

## 2016-06-14 ENCOUNTER — Ambulatory Visit (INDEPENDENT_AMBULATORY_CARE_PROVIDER_SITE_OTHER): Payer: Medicare Other | Admitting: Internal Medicine

## 2016-06-14 ENCOUNTER — Ambulatory Visit: Payer: Medicare Other | Admitting: Internal Medicine

## 2016-06-14 ENCOUNTER — Encounter: Payer: Self-pay | Admitting: Internal Medicine

## 2016-06-14 VITALS — BP 136/78 | HR 76 | Temp 98.6°F | Ht 60.0 in | Wt 187.4 lb

## 2016-06-14 DIAGNOSIS — J0100 Acute maxillary sinusitis, unspecified: Secondary | ICD-10-CM

## 2016-06-14 DIAGNOSIS — J302 Other seasonal allergic rhinitis: Secondary | ICD-10-CM

## 2016-06-14 MED ORDER — CEFTRIAXONE SODIUM 1 G IJ SOLR
1.0000 g | Freq: Once | INTRAMUSCULAR | Status: AC
  Administered 2016-06-14: 1 g via INTRAMUSCULAR

## 2016-06-14 MED ORDER — FLUCONAZOLE 150 MG PO TABS: 150.0000 mg | ORAL_TABLET | Freq: Every day | ORAL | 0 refills | Status: DC

## 2016-06-14 MED ORDER — FLUTICASONE PROPIONATE 50 MCG/ACT NA SUSP: 2.0000 | Freq: Every day | NASAL | 6 refills | Status: DC

## 2016-06-14 MED ORDER — CEFDINIR 300 MG PO CAPS: 300.0000 mg | ORAL_CAPSULE | Freq: Two times a day (BID) | ORAL | 0 refills | Status: DC

## 2016-06-14 NOTE — Progress Notes (Signed)
Patient ID: Crystal Preston, female   DOB: 1940/08/12, 76 y.o.   MRN: [id number]    Location:  [facility] Place of Service: OFFICE  Chief Complaint  Patient presents with  . Acute Visit    congestion, cough    HPI:  76 yo female seen today for f/u cough/congestion. She began having URI sx's on Feb 5th. She has completed medrol dose pak Rx on 05/29/16 and also took OTC coricidan HBP. She continues to have purulent rhinorrhea and productive cough. She has sinus pain left that improves with heat application. She also reports hoarseness, nasal congestion, weakness, post nasal drip, HA, reduced appetite due to loss of taste. She denies CP, SOB, ear pain/pressure, f/c, sore throat, dizziness.  She feels a little better since completing medrol dose pak.  HTN - takes losartan HCT with Kdur. She is on ASA daily  Hyperlipidemia - takes pravastatin  Osteoporosis - takes monthly Boniva. Last DXA in 2014  Hypokalemia - diuretic induced. Takes Kdur  Anemia - takes iron supplement  GERD/hx duodenitis - takes protonix. Last colonoscopy in 09/2013  Tinnitus - chronic. Has had hearing eval and no significant loss. 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 GERD (gastroesophageal reflux disease)   . HTN (hypertension)   . Hyperlipidemia   . Insomnia, unspecified   . Obesity, unspecified   . Osteoarthritis   . Other abnormality of red blood cells    Microcytosis  . Peripheral vascular disease, unspecified   . Vertigo, benign positional    Head to left side    Past Surgical History:  Procedure Laterality Date  . ABLATION ON ENDOMETRIOSIS  1980  . CHOLECYSTECTOMY, LAPAROSCOPIC  1989  . COLONOSCOPY  2008  . COLONOSCOPY  2013  . TOTAL ABDOMINAL HYSTERECTOMY  1971    Patient Care Team: Gildardo Cranker, DO as  PCP - General (Internal Medicine) Sharyne Peach, MD as Consulting Physician (Ophthalmology)  Social History   Social History  . Marital status: Widowed    Spouse name: N/A  . Number of children: 2  . Years of education: N/A   Occupational History  . [employer]    Social History Main Topics  . Smoking status: Never Smoker  . Smokeless tobacco: Never Used  . Alcohol use No  . Drug use: No  . Sexual activity: Not Currently   Other Topics Concern  . Not on file   Social History Narrative   Diet:       Do you drink/ eat things with caffeine? Yes/Coffee      Marital status:  Widowed                             What year were you married ? 2703-5009      Do you live in a house, apartment,assistred living, condo, trailer, etc.)?Apt.      Is it one or more stories? 1      How many persons live in your home ? 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   Yes  Type & how often: Bike/Row 2-5 times a week      Do you have a living will? No      Do you have a DNR form?  No                     If not, do you want to discuss one? No      Do you have signed POA?HPOA forms?  No               If so, please bring to your        appointment        reports that she has never smoked. She has never used smokeless tobacco. She reports that she does not drink alcohol or use drugs.  Family History  Problem Relation Age of Onset  . Cancer Father 50  . Cancer Mother 82   Family Status  Relation Status  . Father Deceased at age 8  . Mother Deceased at age 72  . Brother Alive  . Sister Alive  . Sister Alive  . Sister Alive  . Son Alive  . Daughter Alive     Allergies  Allergen Reactions  . Azithromycin Other (See Comments)    headache, excessive sleepiness  . Nitrofuran Derivatives Other (See Comments)    Knots on body    Medications: Patient's Medications    New Prescriptions   No medications on file  Previous Medications   ASPIRIN 81 MG TABLET    Take 81 mg by mouth daily.   BIOFLAVONOID PRODUCTS (ESTER C PO)    Take 1 tablet (1000 mg ) by mouth once a day   CALCIUM CITRATE PO    Take 1 tablet (1000 mg ) by mouth daily   CHOLECALCIFEROL 4000 UNITS TABS    Take 1 tablet by mouth daily   COD LIVER OIL CAPS    Take 1 tablet by mouth daily   FERROUS SULFATE 325 (65 FE) MG EC TABLET    Take 325 mg by mouth daily with breakfast.   IBANDRONATE (BONIVA) 150 MG TABLET    Take 1 tablet (150 mg total) by mouth every 30 (thirty) days. Take in the morning with a full glass of water, on an empty stomach, and do not take anything else by mouth or lie down for the next 30 min.   LOSARTAN-HYDROCHLOROTHIAZIDE (HYZAAR) 100-12.5 MG PER TABLET    Take 1 tablet by mouth daily.   PANTOPRAZOLE (PROTONIX) 40 MG TABLET    TAKE ONE TABLET BY MOUTH ONCE DAILY   POTASSIUM CHLORIDE CRYS CR (KLOR-CON M20 PO)    Take once daily   PRAVASTATIN (PRAVACHOL) 40 MG TABLET    Take 40 mg by mouth daily.  Modified Medications   No medications on file  Discontinued Medications   METHYLPREDNISOLONE (MEDROL) 4 MG TBPK TABLET    Take one Pak as Directed    Review of Systems  Constitutional: Positive for appetite change.  HENT: Positive for postnasal drip, sinus pain, sinus pressure and voice change.   Neurological: Positive for headaches.  All other systems reviewed and are negative.   Vitals:   06/14/16 1052  BP: 136/78  Pulse: 76  Temp: 98.6 F (37 C)  TempSrc: Oral  SpO2: 98%  Weight: 187 lb 6.4 oz (85 kg)  Height: 5' (1.524 m)   Body mass index is 36.6 kg/m.  Physical Exam  HENT:  TMs dull b/l. Left maxillary sinus TTP with boggy  tissue texture changes. Nares with enlarged grey dry turbinates. Oropharynx cobblestoning and redness but no exudate. No oral thrush  Eyes: Pupils are equal, round, and reactive to light. Right eye exhibits no discharge. Left eye exhibits  no discharge. No scleral icterus.  Neck: Neck supple.  Cardiovascular: Normal rate, regular rhythm and intact distal pulses.  Exam reveals no gallop and no friction rub.   Murmur (1/6 SEM) heard. No LE edema b/l. No calf TTP  Pulmonary/Chest: Effort normal. No respiratory distress. She has wheezes (with cough, end expiratory and prolonged expiratory phase). She has no rales. She exhibits no tenderness.  Lymphadenopathy:    She has cervical adenopathy (TTP left anterolateral).     Labs reviewed: Lab on 06/05/2016  Component Date Value Ref Range Status  . WBC 06/05/2016 11.0* 3.8 - 10.8 K/uL Final  . RBC 06/05/2016 5.06  3.80 - 5.10 MIL/uL Final  . Hemoglobin 06/05/2016 11.5* 11.7 - 15.5 g/dL Final  . HCT 06/05/2016 37.0  35.0 - 45.0 % Final  . MCV 06/05/2016 73.1* 80.0 - 100.0 fL Final  . MCH 06/05/2016 22.7* 27.0 - 33.0 pg Final  . MCHC 06/05/2016 31.1* 32.0 - 36.0 g/dL Final  . RDW 06/05/2016 14.5  11.0 - 15.0 % Final  . Platelets 06/05/2016 449* 140 - 400 K/uL Final  . MPV 06/05/2016 8.6  7.5 - 12.5 fL Final  . Neutro Abs 06/05/2016 6490  1,500 - 7,800 cells/uL Final  . Lymphs Abs 06/05/2016 3300  850 - 3,900 cells/uL Final  . Monocytes Absolute 06/05/2016 990* 200 - 950 cells/uL Final  . Eosinophils Absolute 06/05/2016 220  15 - 500 cells/uL Final  . Basophils Absolute 06/05/2016 0  0 - 200 cells/uL Final  . Neutrophils Relative % 06/05/2016 59  % Final  . Lymphocytes Relative 06/05/2016 30  % Final  . Monocytes Relative 06/05/2016 9  % Final  . Eosinophils Relative 06/05/2016 2  % Final  . Basophils Relative 06/05/2016 0  % Final  . Smear Review 06/05/2016 Criteria for review not met   Final  . Sodium 06/05/2016 137  135 - 146 mmol/L Final  . Potassium 06/05/2016 3.8  3.5 - 5.3 mmol/L Final  . Chloride 06/05/2016 97* 98 - 110 mmol/L Final  . CO2 06/05/2016 27  20 - 31 mmol/L Final  . Glucose, Bld 06/05/2016 85  65 - 99 mg/dL Final  . BUN 06/05/2016 8  7 - 25 mg/dL Final    . Creat 06/05/2016 0.72  0.60 - 0.93 mg/dL Final   Comment:   For patients > or = 77 years of age: The upper reference limit for Creatinine is approximately 13% higher for people identified as African-American.     . Total Bilirubin 06/05/2016 0.4  0.2 - 1.2 mg/dL Final  . Alkaline Phosphatase 06/05/2016 65  33 - 130 U/L Final  . AST 06/05/2016 20  10 - 35 U/L Final  . ALT 06/05/2016 24  6 - 29 U/L Final  . Total Protein 06/05/2016 7.3  6.1 - 8.1 g/dL Final  . Albumin 06/05/2016 3.9  3.6 - 5.1 g/dL Final  . Calcium 06/05/2016 9.6  8.6 - 10.4 mg/dL Final  . GFR, Est African American 06/05/2016 >89  >=60 mL/min Final  . GFR, Est Non African American 06/05/2016 82  >=60 mL/min Final  . Cholesterol 06/05/2016 128  <200 mg/dL Final  . Triglycerides 06/05/2016 110  <150 mg/dL Final  . HDL 06/05/2016 41* >50 mg/dL Final  . Total  CHOL/HDL Ratio 06/05/2016 3.1  <5.0 Ratio Final  . VLDL 06/05/2016 22  <30 mg/dL Final  . LDL Cholesterol 06/05/2016 65  <100 mg/dL Final  . TSH 06/05/2016 0.96  mIU/L Final   Comment:   Reference Range   > or = 20 Years  0.40-4.50   Pregnancy Range First trimester  0.26-2.66 Second trimester 0.55-2.73 Third trimester  0.43-2.91     . Color, Urine 06/05/2016 YELLOW  YELLOW Final  . APPearance 06/05/2016 CLEAR  CLEAR Final  . Specific Gravity, Urine 06/05/2016 1.004  1.001 - 1.035 Final  . pH 06/05/2016 7.0  5.0 - 8.0 Final  . Glucose, UA 06/05/2016 NEGATIVE  NEGATIVE Final  . Bilirubin Urine 06/05/2016 NEGATIVE  NEGATIVE Final  . Ketones, ur 06/05/2016 NEGATIVE  NEGATIVE Final  . Hgb urine dipstick 06/05/2016 NEGATIVE  NEGATIVE Final  . Protein, ur 06/05/2016 NEGATIVE  NEGATIVE Final  . Nitrite 06/05/2016 NEGATIVE  NEGATIVE Final  . Leukocytes, UA 06/05/2016 3+* NEGATIVE Final  . WBC, UA 06/05/2016 0-5  <=5 WBC/HPF Final  . RBC / HPF 06/05/2016 0-2  <=2 RBC/HPF Final  . Squamous Epithelial / LPF 06/05/2016 0-5  <=5 HPF Final  . Bacteria, UA  06/05/2016 MANY* NONE SEEN HPF Final  . Crystals 06/05/2016 NONE SEEN  NONE SEEN HPF Final  . Casts 06/05/2016 NONE SEEN  NONE SEEN LPF Final  . Yeast 06/05/2016 NONE SEEN  NONE SEEN HPF Final  Office Visit on 04/09/2016  Component Date Value Ref Range Status  . Color, UA 04/09/2016 yellow   Final  . Clarity, UA 04/09/2016 clear   Final  . Glucose, UA 04/09/2016 negative   Final  . Bilirubin, UA 04/09/2016 negative   Final  . Ketones, UA 04/09/2016 negative   Final  . Spec Grav, UA 04/09/2016 1.010   Final  . Blood, UA 04/09/2016 negative   Final  . pH, UA 04/09/2016 7.5   Final  . Protein, UA 04/09/2016 negative   Final  . Urobilinogen, UA 04/09/2016 0.2   Final  . Nitrite, UA 04/09/2016 negative   Final  . Leukocytes, UA 04/09/2016 Negative  Negative Final  . Culture 04/09/2016 ESCHERICHIA COLI   Final   Comment: PT RECENTLY ON AMOXICILLIN FOR DENTAL WORK; SOURCE: URINE&UR   . Colony Count 04/09/2016 >=100,000 COLONIES/ML   Final  . Organism ID, Bacteria 04/09/2016 ESCHERICHIA COLI   Final  Abstract on 04/09/2016  Component Date Value Ref Range Status  . HM Mammogram 04/08/2016 0-4 Bi-Rad  0-4 Bi-Rad, Self Reported Normal Final    No results found.   Assessment/Plan   ICD-9-CM ICD-10-CM   1. Acute non-recurrent maxillary sinusitis 461.0 J01.00 cefdinir (OMNICEF) 300 MG capsule     cefTRIAXone (ROCEPHIN) injection 1 g  2. Chronic seasonal allergic rhinitis, unspecified trigger 477.8 J30.2 fluticasone (FLONASE) 50 MCG/ACT nasal spray     cefTRIAXone (ROCEPHIN) injection 1 g   START ALLEGRA 180 mg daily for seasonal allergy for 4 weeks and then use as needed  START FLONASE NASAL SPRAY use 1 spray each nostril as directed daily for 4 weeks then use as needed  ON FEBRUARY 24TH, START OMNICEF 300MG every 12 hours for 10 days. Take with food.  Use fluconazole 141m po today and may repeat in the AM  Rocephin injection 1 gram given today  Take OTC probiotic (culturelle,  activia, etc) daily while on antibiotic  Push fluids and rest  Follow up as scheduled or sooner if not feeling better   S. Perlie Gold  [facility] [address] [place], [place] [address] [phone] Cell (Monday-Friday 8 AM - 5 PM) [phone] After 5 PM and follow prompts

## 2016-06-14 NOTE — Patient Instructions (Signed)
START ALLEGRA 180 mg daily for seasonal allergy for 4 weeks and then use as needed  START FLONASE NASAL SPRAY use 1 spray each nostril as directed daily for 4 weeks then use as needed  ON FEBRUARY 24TH, START OMNICEF 300MG  every 12 hours for 10 days. Take with food.  Rocephin injection 1 gram given today  Take OTC probiotic (culturelle, activia, etc) daily while on antibiotic  Push fluids and rest  Follow up as scheduled or sooner if not feeling better

## 2016-06-26 ENCOUNTER — Ambulatory Visit: Payer: Medicare Other | Admitting: Internal Medicine

## 2016-07-11 DIAGNOSIS — H6983 Other specified disorders of Eustachian tube, bilateral: Secondary | ICD-10-CM | POA: Diagnosis not present

## 2016-07-16 ENCOUNTER — Telehealth: Payer: Self-pay | Admitting: *Deleted

## 2016-07-16 NOTE — Telephone Encounter (Signed)
Patient called and left message on Clinical Intake Line and stated that she was still having Hoarseness and sore throat and not feeling well. Has taken antibiotics with no relief.  I returned call and left message on voicemail to call our office and schedule an appointment since she was still having symptoms and completed a course of antibiotics.

## 2016-07-17 ENCOUNTER — Ambulatory Visit (INDEPENDENT_AMBULATORY_CARE_PROVIDER_SITE_OTHER): Payer: Medicare Other | Admitting: Internal Medicine

## 2016-07-17 ENCOUNTER — Encounter: Payer: Self-pay | Admitting: Internal Medicine

## 2016-07-17 DIAGNOSIS — J209 Acute bronchitis, unspecified: Secondary | ICD-10-CM | POA: Insufficient documentation

## 2016-07-17 MED ORDER — PHENYLEPHRINE-APAP-GUAIFENESIN 5-325-200 MG PO TABS: ORAL_TABLET | ORAL | 0 refills | Status: DC

## 2016-07-17 NOTE — Progress Notes (Signed)
Facility  [facility]    Place of Service:   OFFICE    Allergies  Allergen Reactions  . Azithromycin Other (See Comments)    headache, excessive sleepiness  . Nitrofuran Derivatives Other (See Comments)    Knots on body    Chief Complaint  Patient presents with  . Acute Visit    Complaining of Sore Throat and Congestion    HPI:  Seen 06/14/16 for AV to follow up cough and congestion. Continued with purulent rhinorrhea and cough. Took Medrol Dose pack. Used penicillin. Got better. Now thinks she has another infection. No fever. No nausea. No pain in the chest.  Medications: Patient's Medications  New Prescriptions   No medications on file  Previous Medications   ASPIRIN 81 MG TABLET    Take 81 mg by mouth daily.   BIOFLAVONOID PRODUCTS (ESTER C PO)    Take 1 tablet (1000 mg ) by mouth once a day   CALCIUM CITRATE PO    Take 1 tablet (1000 mg ) by mouth daily   CHOLECALCIFEROL 4000 UNITS TABS    Take 1 tablet by mouth daily   COD LIVER OIL CAPS    Take 1 tablet by mouth daily   FERROUS SULFATE 325 (65 FE) MG EC TABLET    Take 325 mg by mouth daily with breakfast.   FLUTICASONE (FLONASE) 50 MCG/ACT NASAL SPRAY    Place 2 sprays into both nostrils daily.   IBANDRONATE (BONIVA) 150 MG TABLET    Take 1 tablet (150 mg total) by mouth every 30 (thirty) days. Take in the morning with a full glass of water, on an empty stomach, and do not take anything else by mouth or lie down for the next 30 min.   LOSARTAN-HYDROCHLOROTHIAZIDE (HYZAAR) 100-12.5 MG PER TABLET    Take 1 tablet by mouth daily.   PANTOPRAZOLE (PROTONIX) 40 MG TABLET    TAKE ONE TABLET BY MOUTH ONCE DAILY   POTASSIUM CHLORIDE CRYS CR (KLOR-CON M20 PO)    Take once daily   PRAVASTATIN (PRAVACHOL) 40 MG TABLET    Take 40 mg by mouth daily.  Modified Medications   No medications on file  Discontinued Medications   CEFDINIR (OMNICEF) 300 MG CAPSULE    Take 1 capsule (300 mg total) by mouth 2 (two) times daily. START ON  February 24TH   FLUCONAZOLE (DIFLUCAN) 150 MG TABLET    Take 1 tablet (150 mg total) by mouth daily.    Review of Systems  Constitutional: Positive for appetite change. Negative for activity change, chills, diaphoresis, fatigue, fever and unexpected weight change.  HENT: Positive for postnasal drip, sinus pain, sinus pressure and voice change. Negative for congestion, ear discharge, ear pain, hearing loss, rhinorrhea, sore throat, tinnitus and trouble swallowing.   Eyes: Negative for pain, redness, itching and visual disturbance.  Respiratory: Positive for cough, shortness of breath and wheezing. Negative for choking.   Cardiovascular: Negative for chest pain, palpitations and leg swelling.  Gastrointestinal: Negative for abdominal distention, abdominal pain, constipation, diarrhea and nausea.  Endocrine: Negative for cold intolerance, heat intolerance, polydipsia, polyphagia and polyuria.  Genitourinary: Negative for difficulty urinating, dysuria, flank pain, frequency, hematuria, pelvic pain, urgency and vaginal discharge.  Musculoskeletal: Negative for arthralgias, back pain, gait problem, myalgias, neck pain and neck stiffness.  Skin: Negative for color change, pallor and rash.  Allergic/Immunologic: Negative.   Neurological: Positive for headaches. Negative for dizziness, tremors, seizures, syncope, weakness and numbness.  Hematological: Negative for  adenopathy. Does not bruise/bleed easily.  Psychiatric/Behavioral: Negative for agitation, behavioral problems, confusion, dysphoric mood, hallucinations, sleep disturbance and suicidal ideas. The patient is not nervous/anxious and is not hyperactive.   All other systems reviewed and are negative.   Vitals:   07/17/16 1221  BP: 128/84  Pulse: 71  Temp: 98.8 F (37.1 C)  TempSrc: Oral  SpO2: 97%  Weight: 189 lb 3.2 oz (85.8 kg)  Height: 5' (1.524 m)   Body mass index is 36.95 kg/m. Wt Readings from Last 3 Encounters:  07/17/16 189  lb 3.2 oz (85.8 kg)  06/14/16 187 lb 6.4 oz (85 kg)  05/24/16 191 lb 3.2 oz (86.7 kg)      Physical Exam  Constitutional: She is oriented to person, place, and time. She appears well-developed and well-nourished. No distress.  HENT:  Right Ear: External ear normal.  Left Ear: External ear normal.  Nose: Nose normal.  Mouth/Throat: Oropharynx is clear and moist. No oropharyngeal exudate.  TMs dull b/l. Left maxillary sinus TTP with boggy tissue texture changes. Nares with enlarged grey dry turbinates. Oropharynx cobblestoning and redness but no exudate. No oral thrush  Eyes: Conjunctivae and EOM are normal. Pupils are equal, round, and reactive to light. Right eye exhibits no discharge. Left eye exhibits no discharge. No scleral icterus.  Neck: Neck supple. No JVD present. No tracheal deviation present. No thyromegaly present.  Cardiovascular: Normal rate, regular rhythm and intact distal pulses.  Exam reveals no gallop and no friction rub.   Murmur (1/6 SEM) heard. No LE edema b/l. No calf TTP  Pulmonary/Chest: Effort normal. No respiratory distress. She has wheezes (with cough, end expiratory and prolonged expiratory phase). She has rales. She exhibits no tenderness.  Abdominal: She exhibits no distension and no mass. There is no tenderness.  Musculoskeletal: Normal range of motion. She exhibits no edema or tenderness.  Lymphadenopathy:    She has cervical adenopathy (TTP left anterolateral).  Neurological: She is alert and oriented to person, place, and time. No cranial nerve deficit. Coordination normal.  Skin: No rash noted. She is not diaphoretic. No erythema. No pallor.  Psychiatric: She has a normal mood and affect. Her behavior is normal. Judgment and thought content normal.    Labs reviewed: Lab Summary Latest Ref Rng & Units 06/05/2016 02/28/2016  Hemoglobin 11.7 - 15.5 g/dL 11.5(L) (None)  Hematocrit 35.0 - 45.0 % 37.0 (None)  White count 3.8 - 10.8 K/uL 11.0(H) (None)    Platelet count 140 - 400 K/uL 449(H) (None)  Sodium 135 - 146 mmol/L 137 139  Potassium 3.5 - 5.3 mmol/L 3.8 4.1  Calcium 8.6 - 10.4 mg/dL 9.6 9.8  Phosphorus - (None) (None)  Creatinine 0.60 - 0.93 mg/dL 0.72 0.79  AST 10 - 35 U/L 20 (None)  Alk Phos 33 - 130 U/L 65 (None)  Bilirubin 0.2 - 1.2 mg/dL 0.4 (None)  Glucose 65 - 99 mg/dL 85 79  Cholesterol <200 mg/dL 128 164  HDL cholesterol >50 mg/dL 41(L) 48(L)  Triglycerides <150 mg/dL 110 191(H)  LDL Direct - (None) (None)  LDL Calc <100 mg/dL 65 78  Total protein 6.1 - 8.1 g/dL 7.3 (None)  Albumin 3.6 - 5.1 g/dL 3.9 (None)   Lab Results  Component Value Date   TSH 0.96 06/05/2016   Lab Results  Component Value Date   BUN 8 06/05/2016   BUN 12 02/28/2016   No results found for: HGBA1C  Assessment/Plan  1. Acute bronchitis, unspecified organism Likely viral -  Phenylephrine-APAP-Guaifenesin (MUCINEX SINUS-MAX CONGESTION) 5-325-200 MG TABS; One twice daily to help congestion  Dispense: 24 each; Refill: 0

## 2016-07-17 NOTE — Patient Instructions (Signed)
Mucinex Sinus max Day and Night to help congestion.

## 2016-07-30 ENCOUNTER — Ambulatory Visit: Payer: Medicare Other | Admitting: Allergy and Immunology

## 2016-07-30 ENCOUNTER — Ambulatory Visit (INDEPENDENT_AMBULATORY_CARE_PROVIDER_SITE_OTHER): Payer: Medicare Other | Admitting: Allergy and Immunology

## 2016-07-30 ENCOUNTER — Encounter: Payer: Self-pay | Admitting: Allergy and Immunology

## 2016-07-30 VITALS — BP 148/92 | HR 80 | Resp 22 | Ht 60.0 in | Wt 190.6 lb

## 2016-07-30 DIAGNOSIS — J3089 Other allergic rhinitis: Secondary | ICD-10-CM | POA: Diagnosis not present

## 2016-07-30 DIAGNOSIS — K219 Gastro-esophageal reflux disease without esophagitis: Secondary | ICD-10-CM | POA: Diagnosis not present

## 2016-07-30 DIAGNOSIS — Z8709 Personal history of other diseases of the respiratory system: Secondary | ICD-10-CM | POA: Diagnosis not present

## 2016-07-30 MED ORDER — MONTELUKAST SODIUM 10 MG PO TABS: 10.0000 mg | ORAL_TABLET | Freq: Every day | ORAL | 5 refills | Status: DC

## 2016-07-30 MED ORDER — METHYLPREDNISOLONE ACETATE 80 MG/ML IJ SUSP
80.0000 mg | Freq: Once | INTRAMUSCULAR | Status: AC
  Administered 2016-07-30: 80 mg via INTRAMUSCULAR

## 2016-07-30 MED ORDER — RANITIDINE HCL 300 MG PO TABS: 300.0000 mg | ORAL_TABLET | Freq: Every day | ORAL | 5 refills | Status: DC

## 2016-07-30 NOTE — Progress Notes (Signed)
Dear Dr. Montez Morita,  Thank you for referring Crystal Preston to the [facility] [place] on 07/30/2016.   Below is a summation of this patient's evaluation and recommendations.  Thank you for your referral. I will keep you informed about this patient's response to treatment.   If you have any questions please do not hesitate to contact me.   Sincerely,  Jessica Priest, MD Allergy / Immunology [facility]   ______________________________________________________________________    NEW PATIENT NOTE  Referring Provider: Kirt Boys, DO Primary Provider: Kirt Boys, DO Date of office visit: 07/30/2016    Subjective:   Chief Complaint:  Crystal Preston (DOB: 1940-12-12) is a 76 y.o. female who presents to the clinic on 07/30/2016 with a chief complaint of Cough and Nasal Congestion .     HPI: Crystal Preston presents to this clinic in evaluation of respiratory tract symptoms that developed February 2018. She had been seen in this clinic previously for an issue with reflux-induced respiratory disease and allergic rhinitis and intermittent asthma. Her last visit to this clinic was 2009.  Apparently in February she developed a very severe cough associated with nasal congestion and stuffiness and anosmia and decreased ability to taste and ugly nasal discharge. Her cough was so severe and was associated with retching and gagging and almost vomiting and possibly wheezing. She was treated with a systemic steroid and an antibiotic which may have helped but even though a lot of her cough has resolved and a lot of her head issue has resolved she's never had complete clearing of the issue with her voice. She still continues to have very significant raspy voice and she has lots of drainage in her throat and she is currently spitting out the drainage and having lots of throat clearing. She can now smell and does not have any  ugly nasal discharge and she does not feel as though her nose is particularly that stuffy at this point.  Past Medical History:  Diagnosis Date  . GERD (gastroesophageal reflux disease)   . HTN (hypertension)   . Hyperlipidemia   . Insomnia, unspecified   . Obesity, unspecified   . Osteoarthritis   . Other abnormality of red blood cells    Microcytosis  . Peripheral vascular disease, unspecified   . Vertigo, benign positional    Head to left side    Past Surgical History:  Procedure Laterality Date  . ABLATION ON ENDOMETRIOSIS  1980  . CHOLECYSTECTOMY, LAPAROSCOPIC  1989  . COLONOSCOPY  2008  . COLONOSCOPY  2013  . TOTAL ABDOMINAL HYSTERECTOMY  1971    Allergies as of 07/30/2016      Reactions   Azithromycin Other (See Comments)   headache, excessive sleepiness   Naproxen    Nitrofuran Derivatives Other (See Comments)   Knots on body   Nitrofurantoin    Propoxyphene    Darvocet      Medication List      aspirin 81 MG tablet Take 81 mg by mouth daily.   CALCIUM CITRATE PO Take 1 tablet (1000 mg ) by mouth daily   Cholecalciferol 4000 units Tabs Take 1 tablet by mouth daily   Cod Liver Oil Caps Take 1 tablet by mouth daily   ESTER C PO Take 1 tablet (1000 mg ) by mouth once a day   ferrous sulfate 325 (65 FE) MG EC tablet Take 325 mg by mouth daily  with breakfast.   ibandronate 150 MG tablet Commonly known as:  BONIVA Take 1 tablet (150 mg total) by mouth every 30 (thirty) days. Take in the morning with a full glass of water, on an empty stomach, and do not take anything else by mouth or lie down for the next 30 min.   KLOR-CON M20 PO Take once daily   losartan-hydrochlorothiazide 100-12.5 MG tablet Commonly known as:  HYZAAR Take 1 tablet by mouth daily.   pantoprazole 40 MG tablet Commonly known as:  PROTONIX TAKE ONE TABLET BY MOUTH ONCE DAILY   Phenylephrine-APAP-Guaifenesin 5-325-200 MG Tabs Commonly known as:  MUCINEX SINUS-MAX  CONGESTION One twice daily to help congestion   pravastatin 40 MG tablet Commonly known as:  PRAVACHOL Take 40 mg by mouth daily.       Review of systems negative except as noted in HPI / PMHx or noted below:  Review of Systems  Constitutional: Negative.   HENT: Negative.   Eyes: Negative.   Respiratory: Negative.   Cardiovascular: Negative.   Gastrointestinal: Negative.   Genitourinary: Negative.   Musculoskeletal: Negative.   Skin: Negative.   Neurological: Negative.   Endo/Heme/Allergies: Negative.   Psychiatric/Behavioral: Negative.     Family History  Problem Relation Age of Onset  . Cancer Father 10  . Cancer Mother 28    cervical cancer  . Glaucoma Brother     Social History   Social History  . Marital status: Widowed    Spouse name: N/A  . Number of children: 2  . Years of education: N/A   Occupational History  . [employer]    Social History Main Topics  . Smoking status: Never Smoker  . Smokeless tobacco: Never Used  . Alcohol use No  . Drug use: No  . Sexual activity: Not Currently   Other Topics Concern  . Not on file   Social History Narrative   Diet:       Do you drink/ eat things with caffeine? Yes/Coffee      Marital status:  Widowed                             What year were you married ? [id number]      Do you live in a house, apartment,assistred living, condo, trailer, etc.)?Apt.      Is it one or more stories? 1      How many persons live in your home ? 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   Yes                           Type & how often: Bike/Row 2-5 times a week      Do you have a living will? No      Do you have a DNR form?  No                     If not, do you want to discuss one? No      Do you have signed POA?HPOA forms?  No               If so, please bring to your        appointment  Environmental and Social  history  Lives in a apartment with a dry environment, no animals located inside the household, carpeting in the bedroom, no plastic on the bed or pillow, no smokers located inside the household.  Objective:   Vitals:   07/30/16 1356  BP: (!) 148/92  Pulse: 80  Resp: (!) 22   Height: 5' (152.4 cm) Weight: 190 lb 9.6 oz (86.5 kg)  Physical Exam  Constitutional: She is well-developed, well-nourished, and in no distress.  Raspy voice, throat clearing  HENT:  Head: Normocephalic. Head is without right periorbital erythema and without left periorbital erythema.  Right Ear: Tympanic membrane, external ear and ear canal normal.  Left Ear: Tympanic membrane, external ear and ear canal normal.  Nose: Mucosal edema present. No rhinorrhea.  Mouth/Throat: Oropharynx is clear and moist and mucous membranes are normal. No oropharyngeal exudate.  Eyes: Conjunctivae and lids are normal. Pupils are equal, round, and reactive to light.  Neck: Trachea normal. No tracheal deviation present. No thyromegaly present.  Cardiovascular: Normal rate, regular rhythm, S1 normal, S2 normal and normal heart sounds.   No murmur heard. Pulmonary/Chest: Effort normal. No stridor. No tachypnea. No respiratory distress. She has no wheezes. She has no rales. She exhibits no tenderness.  Abdominal: Soft. She exhibits no distension and no mass. There is no hepatosplenomegaly. There is no tenderness. There is no rebound and no guarding.  Musculoskeletal: She exhibits no edema or tenderness.  Lymphadenopathy:       Head (right side): No tonsillar adenopathy present.       Head (left side): No tonsillar adenopathy present.    She has no cervical adenopathy.    She has no axillary adenopathy.  Neurological: She is alert. Gait normal.  Skin: No rash noted. She is not diaphoretic. No erythema. No pallor. Nails show no clubbing.  Psychiatric: Mood and affect normal.    Diagnostics: Allergy skin tests were not performed.    Spirometry was performed and demonstrated an FEV1 of 1.61 @ 123 % of predicted.  Assessment and Plan:    1. Other allergic rhinitis   2. LPRD (laryngopharyngeal reflux disease)   3. History of asthma     1. Treat inflammation:   A. Flonase 2 sprays each nostril one time per day  B. montelukast 10 mg tablet 1 time per day  C. Depo-Medrol 80 mg delivered in clinic today  2. Treat reflux:   A. increase pantoprazole to 40 mg twice a day  B. ranitidine 300 mg in evening  3. If needed:   A. cetirizine/Zyrtec 10 mg tablet 1 time per day  B. OTC Mucinex DM 2 tablets twice a day  C. nasal saline  4. Return to clinic in 2 weeks or earlier if problem  It sounds as though Aaisha had a rather significant respiratory tract infection earlier this year with some lingering inflammation and also probable recrudescence of LPR for which she will use anti-inflammatory agents as noted above her respiratory tract and also continue to aggressively treat reflux as noted above. I'll regroup with her in 2 weeks to make sure she is going down the road to improvement. We may need to image her airway if she still continues to have significant problems in the face of this approach.  Jessica Priest, MD [facility] [place]

## 2016-07-30 NOTE — Patient Instructions (Addendum)
  1. Treat inflammation:   A. Flonase 2 sprays each nostril one time per day  B. montelukast 10 mg tablet 1 time per day  C. Depo-Medrol 80 mg delivered in clinic today  2. Treat reflux:   A. increase pantoprazole to 40 mg twice a day  B. ranitidine 300 mg in evening  3. If needed:   A. cetirizine/Zyrtec 10 mg tablet 1 time per day  B. OTC Mucinex DM 2 tablets twice a day  C. nasal saline  4. Return to clinic in 2 weeks or earlier if problem

## 2016-08-13 ENCOUNTER — Ambulatory Visit (INDEPENDENT_AMBULATORY_CARE_PROVIDER_SITE_OTHER): Payer: Medicare Other | Admitting: Allergy and Immunology

## 2016-08-13 ENCOUNTER — Encounter: Payer: Self-pay | Admitting: Allergy and Immunology

## 2016-08-13 VITALS — BP 112/68 | HR 72 | Resp 16

## 2016-08-13 DIAGNOSIS — K219 Gastro-esophageal reflux disease without esophagitis: Secondary | ICD-10-CM

## 2016-08-13 DIAGNOSIS — J3089 Other allergic rhinitis: Secondary | ICD-10-CM

## 2016-08-13 NOTE — Patient Instructions (Addendum)
  1. Continue to Treat inflammation:   A. Flonase 2 sprays each nostril one time per day  B. montelukast 10 mg tablet 1 time per day  2. Continue to Treat reflux:   A. pantoprazole to 40 mg twice a day  B. ranitidine 300 mg in evening  3. If needed:   A. cetirizine/Zyrtec 10 mg tablet 1 time per day  B. OTC Mucinex DM 2 tablets twice a day  C. nasal saline  4. Replaced throat clearing maneuver with swallowing maneuver  5. Return to clinic in 10 weeks or earlier if problem

## 2016-08-13 NOTE — Progress Notes (Signed)
Follow-up Note  Referring Provider: Kirt Boys, DO Primary Provider: Kirt Boys, DO Date of Office Visit: 08/13/2016  Subjective:   Crystal Preston (DOB: [birth year]) is a 76 y.o. female who [facility] on 08/13/2016 in re-evaluation of the following:  HPI: [name] returns to this clinic in reevaluation of her cough and LPR and rhinitis that was addressed on her initial evaluation of 07/30/2016.  She is much better. She has very little cough. She does not have any gagging or retching. She has had a decrease in her spitting and postnasal drip. She still slightly raspy. She still has to use this unique throat clearing maneuver on occasion to clear out her throat.  Allergies as of 08/13/2016      Reactions   Azithromycin Other (See Comments)   headache, excessive sleepiness   Naproxen    Nitrofuran Derivatives Other (See Comments)   Knots on body   Nitrofurantoin    Propoxyphene    Darvocet      Medication List      aspirin 81 MG tablet Take 81 mg by mouth daily.   CALCIUM CITRATE PO Take 1 tablet (1000 mg ) by mouth daily   Cholecalciferol 4000 units Tabs Take 1 tablet by mouth daily   CLEAR EYES OP Apply to eye.   Cod Liver Oil Caps Take 1 tablet by mouth daily   ESTER C PO Take 1 tablet (1000 mg ) by mouth once a day   ferrous sulfate 325 (65 FE) MG EC tablet Take 325 mg by mouth daily with breakfast.   ibandronate 150 MG tablet Commonly known as:  BONIVA Take 1 tablet (150 mg total) by mouth every 30 (thirty) days. Take in the morning with a full glass of water, on an empty stomach, and do not take anything else by mouth or lie down for the next 30 min.   KLOR-CON M20 PO Take once daily   losartan-hydrochlorothiazide 100-12.5 MG tablet Commonly known as:  HYZAAR Take 1 tablet by mouth daily.   montelukast 10 MG tablet Commonly known as:  SINGULAIR Take 1 tablet (10 mg total) by mouth at bedtime.   pantoprazole  40 MG tablet Commonly known as:  PROTONIX TAKE ONE TABLET BY MOUTH ONCE DAILY   pravastatin 40 MG tablet Commonly known as:  PRAVACHOL Take 40 mg by mouth daily.   ranitidine 300 MG tablet Commonly known as:  ZANTAC Take 1 tablet (300 mg total) by mouth at bedtime.   TYLENOL PO Take by mouth. Tylenol Body Pain Night- acetominophen and methocarbamol   TYLENOL 8 HOUR 650 MG CR tablet Generic drug:  acetaminophen Take 650 mg by mouth every 8 (eight) hours as needed for pain.       Past Medical History:  Diagnosis Date  . GERD (gastroesophageal reflux disease)   . HTN (hypertension)   . Hyperlipidemia   . Insomnia, unspecified   . Obesity, unspecified   . Osteoarthritis   . Other abnormality of red blood cells    Microcytosis  . Peripheral vascular disease, unspecified (HCC)   . Vertigo, benign positional    Head to left side    Past Surgical History:  Procedure Laterality Date  . ABLATION ON ENDOMETRIOSIS  1980  . CHOLECYSTECTOMY, LAPAROSCOPIC  1989  . COLONOSCOPY  2008  . COLONOSCOPY  2013  . TOTAL ABDOMINAL HYSTERECTOMY  1971    Review of systems negative except as noted in HPI / PMHx  or noted below:  Review of Systems  Constitutional: Negative.   HENT: Negative.   Eyes: Negative.   Respiratory: Negative.   Cardiovascular: Negative.   Gastrointestinal: Negative.   Genitourinary: Negative.   Musculoskeletal: Negative.   Skin: Negative.   Neurological: Negative.   Endo/Heme/Allergies: Negative.   Psychiatric/Behavioral: Negative.      Objective:   Vitals:   08/13/16 1706  BP: 112/68  Pulse: 72  Resp: 16          Physical Exam  Constitutional: She is well-developed, well-nourished, and in no distress.  Throat clearing  HENT:  Head: Normocephalic.  Right Ear: Tympanic membrane, external ear and ear canal normal.  Left Ear: Tympanic membrane, external ear and ear canal normal.  Nose: Nose normal. No mucosal edema or rhinorrhea.    Mouth/Throat: Uvula is midline, oropharynx is clear and moist and mucous membranes are normal. No oropharyngeal exudate.  Eyes: Conjunctivae are normal.  Neck: Trachea normal. No tracheal tenderness present. No tracheal deviation present. No thyromegaly present.  Cardiovascular: Normal rate, regular rhythm, S1 normal, S2 normal and normal heart sounds.   No murmur heard. Pulmonary/Chest: Breath sounds normal. No stridor. No respiratory distress. She has no wheezes. She has no rales.  Musculoskeletal: She exhibits no edema.  Lymphadenopathy:       Head (right side): No tonsillar adenopathy present.       Head (left side): No tonsillar adenopathy present.    She has no cervical adenopathy.  Neurological: She is alert. Gait normal.  Skin: No rash noted. She is not diaphoretic. No erythema. Nails show no clubbing.  Psychiatric: Mood and affect normal.    Diagnostics: none     Assessment and Plan:   1. Other allergic rhinitis   2. LPRD (laryngopharyngeal reflux disease)     1. Continue to Treat inflammation:   A. Flonase 2 sprays each nostril one time per day  B. montelukast 10 mg tablet 1 time per day  2. Continue to Treat reflux:   A. pantoprazole to 40 mg twice a day  B. ranitidine 300 mg in evening  3. If needed:   A. cetirizine/Zyrtec 10 mg tablet 1 time per day  B. OTC Mucinex DM 2 tablets twice a day  C. nasal saline  4. Replaced throat clearing maneuver with swallowing maneuver  5. Return to clinic in 10 weeks or earlier if problem  Avarae's two week response to therapy has been very good and I'm going to continue to have her use this treatment for a full 12 weeks and thus I will see her back in this clinic in 10 weeks. I've encouraged her not to throat clear at any point in time and instead to replace her throat clearing maneuver with her swallowing maneuver as her throat clearing maneuver is keeping the walls of her laryngeal structure irritated. She will contact me  during the interval should there be a significant problem.  Laurette Schimke, MD Allergy / Immunology [facility]

## 2016-09-02 ENCOUNTER — Other Ambulatory Visit: Payer: Self-pay | Admitting: Internal Medicine

## 2016-09-02 DIAGNOSIS — J302 Other seasonal allergic rhinitis: Secondary | ICD-10-CM

## 2016-09-18 ENCOUNTER — Encounter: Payer: Self-pay | Admitting: Internal Medicine

## 2016-09-24 ENCOUNTER — Other Ambulatory Visit: Payer: Self-pay | Admitting: *Deleted

## 2016-09-24 MED ORDER — LOSARTAN POTASSIUM-HCTZ 100-12.5 MG PO TABS: 1.0000 | ORAL_TABLET | Freq: Every day | ORAL | 3 refills | Status: DC

## 2016-09-24 NOTE — Telephone Encounter (Signed)
Patient requested to be sent to pharmacy.  

## 2016-10-28 ENCOUNTER — Other Ambulatory Visit: Payer: Self-pay | Admitting: Internal Medicine

## 2016-10-28 ENCOUNTER — Other Ambulatory Visit: Payer: Self-pay | Admitting: *Deleted

## 2016-10-28 DIAGNOSIS — J302 Other seasonal allergic rhinitis: Secondary | ICD-10-CM

## 2016-10-28 MED ORDER — FLUTICASONE PROPIONATE 50 MCG/ACT NA SUSP: 2.0000 | Freq: Every day | NASAL | 2 refills | Status: DC

## 2016-11-15 ENCOUNTER — Encounter: Payer: Medicare Other | Admitting: Internal Medicine

## 2016-11-18 ENCOUNTER — Other Ambulatory Visit: Payer: Self-pay | Admitting: Internal Medicine

## 2016-11-25 ENCOUNTER — Other Ambulatory Visit: Payer: Self-pay | Admitting: Internal Medicine

## 2016-11-26 ENCOUNTER — Encounter: Payer: Self-pay | Admitting: Internal Medicine

## 2016-11-26 ENCOUNTER — Ambulatory Visit (INDEPENDENT_AMBULATORY_CARE_PROVIDER_SITE_OTHER): Payer: Medicare Other | Admitting: Internal Medicine

## 2016-11-26 VITALS — BP 110/64 | HR 76 | Temp 98.9°F | Resp 12 | Ht 60.0 in | Wt 192.2 lb

## 2016-11-26 DIAGNOSIS — J0101 Acute recurrent maxillary sinusitis: Secondary | ICD-10-CM

## 2016-11-26 DIAGNOSIS — Z0000 Encounter for general adult medical examination without abnormal findings: Secondary | ICD-10-CM | POA: Diagnosis not present

## 2016-11-26 DIAGNOSIS — E876 Hypokalemia: Secondary | ICD-10-CM | POA: Diagnosis not present

## 2016-11-26 DIAGNOSIS — E785 Hyperlipidemia, unspecified: Secondary | ICD-10-CM

## 2016-11-26 DIAGNOSIS — I10 Essential (primary) hypertension: Secondary | ICD-10-CM | POA: Diagnosis not present

## 2016-11-26 DIAGNOSIS — M150 Primary generalized (osteo)arthritis: Secondary | ICD-10-CM | POA: Diagnosis not present

## 2016-11-26 DIAGNOSIS — Z79899 Other long term (current) drug therapy: Secondary | ICD-10-CM | POA: Diagnosis not present

## 2016-11-26 DIAGNOSIS — J3089 Other allergic rhinitis: Secondary | ICD-10-CM

## 2016-11-26 DIAGNOSIS — M159 Polyosteoarthritis, unspecified: Secondary | ICD-10-CM

## 2016-11-26 LAB — BASIC METABOLIC PANEL WITH GFR
BUN: 11 mg/dL (ref 7–25)
CHLORIDE: 102 mmol/L (ref 98–110)
CO2: 24 mmol/L (ref 20–32)
Calcium: 10.2 mg/dL (ref 8.6–10.4)
Creat: 0.76 mg/dL (ref 0.60–0.93)
GFR, Est African American: 88 mL/min (ref >=60)
GFR, Est Non African American: 76 mL/min (ref >=60)
Glucose, Bld: 98 mg/dL (ref 65–99)
POTASSIUM: 3.7 mmol/L (ref 3.5–5.3)
Sodium: 139 mmol/L (ref 135–146)

## 2016-11-26 LAB — LIPID PANEL
CHOL/HDL RATIO: 3.1 ratio (ref <5.0)
Cholesterol: 170 mg/dL (ref <200)
HDL: 54 mg/dL (ref >50)
LDL Cholesterol: 76 mg/dL (ref <100)
Triglycerides: 199 mg/dL — ABNORMAL HIGH (ref <150)
VLDL: 40 mg/dL — ABNORMAL HIGH (ref <30)

## 2016-11-26 LAB — ALT: ALT: 18 U/L (ref 6–29)

## 2016-11-26 MED ORDER — CEFDINIR 300 MG PO CAPS: 300.0000 mg | ORAL_CAPSULE | Freq: Two times a day (BID) | ORAL | 0 refills | Status: DC

## 2016-11-26 NOTE — Patient Instructions (Addendum)
Recommend follow up with Dr Lucie LeatherKozlow for further recommendations of post nasal drip and seasonal allergy symptoms. May need imaging studies to further assess.  START SALINE NASAL SPRAY AS NEEDED TO KEEP NOSE MOIST  START OMNICEF (cefdinir) 300mg  2 times daily x 2 weeks. Take probiotic daily while on cefdinir  Continue current medications as ordered.  Will call with lab results  Push fluids and rest  Follow up in 3 mos for HTN, hyperlipidemia

## 2016-11-26 NOTE — Progress Notes (Signed)
Patient ID: Crystal Preston, female   DOB: Feb 18, 1941, 76 y.o.   MRN: [id number]   Location:  [facility]  Place of Service:  OFFICE  Provider: Arletha Grippe, DO  Patient Care Team: Gildardo Cranker, DO as PCP - General (Internal Medicine) Sharyne Peach, MD as Consulting Physician (Ophthalmology)  Extended Emergency Contact Information Primary Emergency Contact: [name],[name] Address: [address] [place], [place] [address] Johnnette Litter of [place] Phone: [phone] Mobile Phone: [phone] Relation: Daughter  Code Status:  FULL CODE Goals of Care: Advanced Directive information Advanced Directives 11/26/2016  Does Patient Have a Medical Advance Directive? No  Would patient like information on creating a medical advance directive? No - Patient declined     Chief Complaint  Patient presents with  . Medical Management of Chronic Issues    Extended Visit, EKG, no pap (vaginal exam),MMSE completed 05/24/16 (28/30), - fall, - depression . Patient c/o throat/mouth concerns   . Medication Refill    Potassium refill due     HPI: Patient is a 76 y.o. female seen in today for a comprehensive exam.  She had an AWV on 05/24/16 - Note reviewed. Vaccines UTD. She c/o oral "thrush" x several mos. She was tx for bronchitis in March for bronchitis followed by UC visits and was seen by allergist Dr Neldon Mc. She has post nasal drip and purulent mucous (greenish yellow). She is taking flonase, allegra and singulair daily. She has not tried saline nasal spray. She has intermittent hoarseness. She never went back to Dr Neldon Mc for f/u (she is c/a cost $45/visit).  HTN - takes losartan HCT with Kdur. She is on ASA daily for anticoagulation  Hyperlipidemia -stable on pravastatin. LDL 65  Osteoporosis - takes monthly Boniva. Last DXA 03/2016 showed T score -2.4  Hypokalemia - diuretic induced. Takes Kdur. K 3.8  Anemia - stable on iron supplement. Hgb 11.5.   GERD/hx duodenitis - takes  protonix daily. Last colonoscopy in 09/2013  Tinnitus - chronic. Has had hearing eval and no significant loss. 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 No No No No No   MMSE - Mini Mental State Exam 05/24/2016  Orientation to time 5  Orientation to Place 5  Registration 3  Attention/ Calculation 5  Recall 3  Language- name 2 objects 2  Language- repeat 1  Language- follow 3 step command 2  Language- read & follow direction 1  Write a sentence 1  Copy design 0  Total score 28     Health Maintenance  Topic Date Due  . INFLUENZA VACCINE  12/20/2016 (Originally 11/20/2016)  . TETANUS/TDAP  05/23/2018 (Originally 11/02/1958)  . PNA vac Low Risk Adult (2 of 2 - PPSV23) 05/24/2017  . DEXA SCAN  Completed    Past Medical History:  Diagnosis Date  . GERD (gastroesophageal reflux disease)   . HTN (hypertension)   . Hyperlipidemia   . Insomnia, unspecified   . Obesity, unspecified   . Osteoarthritis   . Other abnormality of  red blood cells    Microcytosis  . Peripheral vascular disease, unspecified ([place])   . Vertigo, benign positional    Head to left side    Past Surgical History:  Procedure Laterality Date  . ABLATION ON ENDOMETRIOSIS  1980  . CHOLECYSTECTOMY, LAPAROSCOPIC  1989  . COLONOSCOPY  2008  . COLONOSCOPY  2013  . TOTAL ABDOMINAL HYSTERECTOMY  1971    Family History  Problem Relation Age of Onset  . Cancer Father 35  . Cancer Mother 54       cervical cancer  . Glaucoma Brother    Family Status  Relation Status  . Father Deceased at age 37  . Mother Deceased at age 38  . Brother  Alive  . Sister Alive  . Sister Alive  . Sister Alive  . Son Alive  . Daughter Alive    Social History   Social History  . Marital status: Widowed    Spouse name: N/A  . Number of children: 2  . Years of education: N/A   Occupational History  . [employer]    Social History Main Topics  . Smoking status: Never Smoker  . Smokeless tobacco: Never Used  . Alcohol use No  . Drug use: No  . Sexual activity: Not Currently   Other Topics Concern  . Not on file   Social History Narrative   Diet:       Do you drink/ eat things with caffeine? Yes/Coffee      Marital status:  Widowed                             What year were you married ? 1950-9326      Do you live in a house, apartment,assistred living, condo, trailer, etc.)?Apt.      Is it one or more stories? 1      How many persons live in your home ? 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   Yes                           Type & how often: Bike/Row 2-5 times a week      Do you have a living will? No      Do you have a DNR form?  No                     If not, do you want to discuss one? No      Do you have signed POA?HPOA forms?  No               If so, please bring to your        appointment       Allergies  Allergen Reactions  . Azithromycin Other (See Comments)    headache, excessive sleepiness  . Naproxen   . Nitrofuran Derivatives Other (See Comments)    Knots on body  . Nitrofurantoin   . Propoxyphene     Darvocet    Allergies as of 11/26/2016      Reactions   Azithromycin Other (See Comments)   headache, excessive sleepiness   Naproxen    Nitrofuran Derivatives Other (See Comments)   Knots on body   Nitrofurantoin  Propoxyphene    Darvocet      Medication List       Accurate as of 11/26/16  3:08 PM. Always use your most recent med list.          aspirin 81 MG tablet Take 81 mg by mouth  daily.   CALCIUM CITRATE PO Take 1 tablet (1000 mg ) by mouth daily   Cholecalciferol 4000 units Tabs Take 1 tablet by mouth daily   CLEAR EYES OP Apply to eye as needed.   Cod Liver Oil Caps Take 1 tablet by mouth daily   ESTER C PO Take 1 tablet (1000 mg ) by mouth once a day   ferrous sulfate 325 (65 FE) MG EC tablet Take 325 mg by mouth daily with breakfast.   fluticasone 50 MCG/ACT nasal spray Commonly known as:  FLONASE Place 2 sprays into both nostrils daily.   ibandronate 150 MG tablet Commonly known as:  BONIVA Take 1 tablet (150 mg total) by mouth every 30 (thirty) days. Take in the morning with a full glass of water, on an empty stomach, and do not take anything else by mouth or lie down for the next 30 min.   losartan-hydrochlorothiazide 100-12.5 MG tablet Commonly known as:  HYZAAR TAKE 1 TABLET BY MOUTH EVERY DAY   montelukast 10 MG tablet Commonly known as:  SINGULAIR Take 1 tablet (10 mg total) by mouth at bedtime.   pantoprazole 40 MG tablet Commonly known as:  PROTONIX TAKE ONE TABLET BY MOUTH ONCE DAILY   potassium chloride SA 20 MEQ tablet Commonly known as:  K-DUR,KLOR-CON Take 20 mEq by mouth daily.   pravastatin 40 MG tablet Commonly known as:  PRAVACHOL Take 40 mg by mouth daily.   TYLENOL PO Take by mouth. Tylenol Body Pain Night- acetominophen and methocarbamol   TYLENOL 8 HOUR 650 MG CR tablet Generic drug:  acetaminophen Take 650 mg by mouth every 8 (eight) hours as needed for pain.        Review of Systems:  Review of Systems  HENT: Positive for postnasal drip, tinnitus and voice change.   All other systems reviewed and are negative.   Physical Exam: Vitals:   11/26/16 1451  BP: 110/64  Pulse: 76  Resp: 12  Temp: 98.9 F (37.2 C)  TempSrc: Oral  SpO2: 97%  Weight: 192 lb 3.2 oz (87.2 kg)  Height: 5' (1.524 m)   Body mass index is 37.54 kg/m. Physical Exam  Constitutional: She is oriented to person, place,  and time. She appears well-developed and well-nourished. No distress.  HENT:  Head: Normocephalic and atraumatic.  Right Ear: External ear normal.  Left Ear: External ear normal.  TMs dull b/l. Left maxillary sinus boggy tissue texture changes;NT. Oropharynx cobblestoning and red but no exudate. No tongue lesions. No oral thrush  Eyes: Pupils are equal, round, and reactive to light. EOM are normal. No scleral icterus.  Neck: Normal range of motion. Neck supple. Carotid bruit is not present. No tracheal deviation present. No thyromegaly present.  Cardiovascular: Normal rate, regular rhythm and intact distal pulses.  Exam reveals no gallop and no friction rub.   No murmur heard. No LE edema b/l. No calf TTP  Pulmonary/Chest: Effort normal and breath sounds normal. No respiratory distress. She has no wheezes. She has no rales. She exhibits no tenderness.  No rhonchi  Abdominal: Soft. Bowel sounds are normal. She exhibits no distension and no mass. There is no hepatosplenomegaly or hepatomegaly. There  is no tenderness. There is no rebound and no guarding. No hernia.  Musculoskeletal: She exhibits edema. She exhibits no deformity.  Lymphadenopathy:    She has no cervical adenopathy.  Neurological: She is alert and oriented to person, place, and time. She has normal reflexes.  Skin: Skin is warm and dry. No rash noted.  Psychiatric: She has a normal mood and affect. Her behavior is normal. Judgment normal.  Vitals reviewed.   Labs reviewed:  Basic Metabolic Panel:  Recent Labs  02/28/16 1223 06/05/16 1354  NA 139 137  K 4.1 3.8  CL 101 97*  CO2 28 27  GLUCOSE 79 85  BUN 12 8  CREATININE 0.79 0.72  CALCIUM 9.8 9.6  TSH  --  0.96   Liver Function Tests:  Recent Labs  02/28/16 1223 06/05/16 1354  AST  --  20  ALT 15 24  ALKPHOS  --  65  BILITOT  --  0.4  PROT  --  7.3  ALBUMIN  --  3.9   No results for input(s): LIPASE, AMYLASE in the last 8760 hours. No results for  input(s): AMMONIA in the last 8760 hours. CBC:  Recent Labs  06/05/16 1354  WBC 11.0*  NEUTROABS 6,490  HGB 11.5*  HCT 37.0  MCV 73.1*  PLT 449*   Lipid Panel:  Recent Labs  02/28/16 1223 06/05/16 1354  CHOL 164 128  HDL 48* 41*  LDLCALC 78 65  TRIG 191* 110  CHOLHDL 3.4 3.1   No results found for: HGBA1C  Procedures: No results found. ECG OBTAINED AND REVIEWED BY MYSELF: NSr @ 72 bpm, nml axis, poor R wave progression. No acute ischemic changes. No other ECG available to compare  Assessment/Plan   ICD-10-CM   1. Well adult exam Z00.00   2. Non-seasonal allergic rhinitis, unspecified trigger J30.89   3. Essential hypertension I10 EKG 12-Lead    BMP with eGFR    ALT  4. Hyperlipidemia, unspecified hyperlipidemia type E78.5 Lipid Panel  5. 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 High risk medication use Z79.899 BMP with eGFR    ALT  7. Hypokalemia E87.6 BMP with eGFR  8. Acute recurrent maxillary sinusitis J01.01 cefdinir (OMNICEF) 300 MG capsule   Recommend follow up with Dr Neldon Mc for further recommendations of post nasal drip and seasonal allergy symptoms. May need imaging studies to further assess.  START SALINE NASAL SPRAY AS NEEDED TO KEEP NOSE MOIST  START OMNICEF (cefdinir) 341m 2 times daily x 2 weeks. Take probiotic daily while on cefdinir  Continue current medications as ordered.  Will call with lab results  Push fluids and rest  Follow up in 3 mos for HTN, hyperlipidemia   Crystal Preston S. CPerlie Gold PSuburban Community Hospitaland Adult Medicine [address]GBelmont [place] [phone] (Monday-Friday 8 AM - 5 PM) [phone] 5 PM and follow prompts

## 2016-12-03 ENCOUNTER — Other Ambulatory Visit: Payer: Self-pay | Admitting: *Deleted

## 2016-12-03 MED ORDER — POTASSIUM CHLORIDE CRYS ER 20 MEQ PO TBCR: 20.0000 meq | EXTENDED_RELEASE_TABLET | Freq: Every day | ORAL | 5 refills | Status: DC

## 2016-12-03 NOTE — Telephone Encounter (Signed)
Patient called requesting refill on Potassium. Reviewed last OV note. Faxed to pharmacy.

## 2016-12-06 ENCOUNTER — Encounter (HOSPITAL_BASED_OUTPATIENT_CLINIC_OR_DEPARTMENT_OTHER): Payer: Self-pay

## 2016-12-06 ENCOUNTER — Emergency Department (HOSPITAL_BASED_OUTPATIENT_CLINIC_OR_DEPARTMENT_OTHER)
Admission: EM | Admit: 2016-12-06 | Discharge: 2016-12-06 | Disposition: A | Discharge status: Home / Self Care | Payer: Medicare Other | Attending: Emergency Medicine | Admitting: Emergency Medicine

## 2016-12-06 ENCOUNTER — Emergency Department (HOSPITAL_BASED_OUTPATIENT_CLINIC_OR_DEPARTMENT_OTHER): Payer: Medicare Other

## 2016-12-06 DIAGNOSIS — Z7982 Long term (current) use of aspirin: Secondary | ICD-10-CM | POA: Diagnosis not present

## 2016-12-06 DIAGNOSIS — M25551 Pain in right hip: Secondary | ICD-10-CM | POA: Diagnosis not present

## 2016-12-06 DIAGNOSIS — Z7902 Long term (current) use of antithrombotics/antiplatelets: Secondary | ICD-10-CM | POA: Diagnosis not present

## 2016-12-06 DIAGNOSIS — Z79899 Other long term (current) drug therapy: Secondary | ICD-10-CM | POA: Diagnosis not present

## 2016-12-06 DIAGNOSIS — I10 Essential (primary) hypertension: Secondary | ICD-10-CM | POA: Diagnosis not present

## 2016-12-06 DIAGNOSIS — R52 Pain, unspecified: Secondary | ICD-10-CM

## 2016-12-06 NOTE — Discharge Instructions (Signed)
X-ray tonight shows mild arthritis. Take Tylenol for pain. Use either a warm compress 4 times daily for 30 minutes at a time or an ice pack 4 times daily for 30 minutes at a time to the painful area. If you have significant pain in a week follow-up with your primary care physician. Return if concern for any reason

## 2016-12-06 NOTE — ED Provider Notes (Addendum)
[place] DEPT [facility] Provider Note   CSN: [id number] Arrival date & time: 12/06/16  1749     History   Chief Complaint Chief Complaint  Patient presents with  . Hip Pain    HPI Crystal Preston is a 76 y.o. female.Complains of right hip pain for 2 days which she denies injury. Denies fever denies feeling ill. Pain is worse with getting up from a seated position or with walking improved with remaining still. Pain is nonradiating. Treated with Tylenol with transient relief. Last took Tylenol at noon today and had no other joint ache. No other associated symptoms  HPI  Past Medical History:  Diagnosis Date  . GERD (gastroesophageal reflux disease)   . HTN (hypertension)   . Hyperlipidemia   . Insomnia, unspecified   . Obesity, unspecified   . Osteoarthritis   . Other abnormality of red blood cells    Microcytosis  . Peripheral vascular disease, unspecified ([place])   . Vertigo, benign positional    Head to left side    Patient Active Problem List   Diagnosis Date Noted  . High risk medication use 11/26/2016  . Acute bronchitis 07/17/2016  . Essential hypertension 02/28/2016  . Age related osteoporosis 02/28/2016  . Estrogen deficiency 02/28/2016  . Primary osteoarthritis involving multiple joints 02/28/2016  . Gastroesophageal reflux disease without esophagitis 02/28/2016  . Hyperlipidemia 02/28/2016    Past Surgical History:  Procedure Laterality Date  . ABLATION ON ENDOMETRIOSIS  1980  . CHOLECYSTECTOMY, LAPAROSCOPIC  1989  . COLONOSCOPY  2008  . COLONOSCOPY  2013  . TOTAL ABDOMINAL HYSTERECTOMY  1971    OB History    No data available       Home Medications    Prior to Admission medications   Medication Sig Start Date End Date Taking? Authorizing Provider  acetaminophen (TYLENOL 8 HOUR) 650 MG CR tablet Take 650 mg by mouth every 8 (eight) hours as needed for pain.    [provider]  Acetaminophen (TYLENOL PO) Take by mouth. Tylenol Body  Pain Night- acetominophen and methocarbamol    [provider]  aspirin 81 MG tablet Take 81 mg by mouth daily.    [provider]  Bioflavonoid Products (ESTER C PO) Take 1 tablet (1000 mg ) by mouth once a day    [provider]  CALCIUM CITRATE PO Take 1 tablet (1000 mg ) by mouth daily    [provider]  cefdinir (OMNICEF) 300 MG capsule Take 1 capsule (300 mg total) by mouth 2 (two) times daily. 11/26/16   Gildardo Cranker, DO  Cholecalciferol 4000 units TABS Take 1 tablet by mouth daily    [provider]  Cod Liver Oil CAPS Take 1 tablet by mouth daily    [provider]  ferrous sulfate 325 (65 FE) MG EC tablet Take 325 mg by mouth daily with breakfast.    [provider]  fluticasone (FLONASE) 50 MCG/ACT nasal spray Place 2 sprays into both nostrils daily. 10/28/16   Gildardo Cranker, DO  ibandronate (BONIVA) 150 MG tablet Take 1 tablet (150 mg total) by mouth every 30 (thirty) days. Take in the morning with a full glass of water, on an empty stomach, and do not take anything else by mouth or lie down for the next 30 min. 02/28/16   Gildardo Cranker, DO  losartan-hydrochlorothiazide [facility]) 100-12.5 MG tablet TAKE 1 TABLET BY MOUTH EVERY DAY 11/25/16   Gildardo Cranker, DO  montelukast (SINGULAIR) 10 MG  tablet Take 1 tablet (10 mg total) by mouth at bedtime. 07/30/16   Kozlow, Donnamarie Poag, MD  Naphazoline HCl (CLEAR EYES OP) Apply to eye as needed.     [provider]  pantoprazole (PROTONIX) 40 MG tablet TAKE ONE TABLET BY MOUTH ONCE DAILY 11/18/16   Gildardo Cranker, DO  potassium chloride SA (K-DUR,KLOR-CON) 20 MEQ tablet Take 1 tablet (20 mEq total) by mouth daily. 12/03/16   Gildardo Cranker, DO  pravastatin (PRAVACHOL) 40 MG tablet Take 40 mg by mouth daily.    [provider]    Family History Family History  Problem Relation Age of Onset  . Cancer Father 53  . Cancer Mother 37       cervical cancer  . Glaucoma Brother       Social History Social History  Substance Use Topics  . Smoking status: Never Smoker  . Smokeless tobacco: Never Used  . Alcohol use No     Allergies   Azithromycin; Naproxen; Nitrofuran derivatives; Nitrofurantoin; and Propoxyphene   Review of Systems Review of Systems  Constitutional: Negative.   HENT: Negative.   Respiratory: Negative.   Cardiovascular: Negative.   Gastrointestinal: Negative.   Musculoskeletal: Positive for arthralgias.       Right hip pain  Skin: Negative.   Neurological: Negative.   Psychiatric/Behavioral: Negative.   All other systems reviewed and are negative.    Physical Exam Updated Vital Signs BP (!) 160/74 (BP Location: Left Arm)   Pulse 88   Temp 99 F (37.2 C) (Oral)   Resp 18   Ht '4\' 11"'  (1.499 m)   Wt 86.2 kg (190 lb)   SpO2 99%   BMI 38.38 kg/m   Physical Exam  Constitutional: She appears well-developed and well-nourished.  HENT:  Head: Normocephalic and atraumatic.  Eyes: Pupils are equal, round, and reactive to light. Conjunctivae are normal.  Neck: Neck supple. No tracheal deviation present. No thyromegaly present.  Cardiovascular: Normal rate and regular rhythm.   No murmur heard. Pulmonary/Chest: Effort normal and breath sounds normal.  Abdominal: Soft. Bowel sounds are normal. She exhibits no distension. There is no tenderness.  OBese  Musculoskeletal: Normal range of motion. She exhibits no edema or tenderness.  Right lower extremity without deformity or redness or tenderness. No pain on internal or external rotation of thigh. She has mild pain at right hip and she walks. Walks without limp DP pulse 2+. Good capillary refill. All other extremities are redness swelling or tenderness neurovascularly intact  Neurological: She is alert. Coordination normal.  Skin: Skin is warm and dry. No rash noted.  Psychiatric: She has a normal mood and affect.  Nursing note and vitals reviewed.    ED Treatments / Results   Labs (all labs ordered are listed, but only abnormal results are displayed) Labs Reviewed - No data to display  EKG  EKG Interpretation None       Radiology No results found.  Procedures Procedures (including critical care time)  Medications Ordered in ED Medications - No data to display  X-rays viewed by me Results for orders placed or performed in visit on 11/26/16  BMP with eGFR  Result Value Ref Range   Sodium 139 135 - 146 mmol/L   Potassium 3.7 3.5 - 5.3 mmol/L   Chloride 102 98 - 110 mmol/L   CO2 24 20 - 32 mmol/L   Glucose, Bld 98 65 - 99 mg/dL   BUN 11 7 - 25 mg/dL   Creat  0.76 0.60 - 0.93 mg/dL   Calcium 10.2 8.6 - 10.4 mg/dL   GFR, Est African American 88 >=60 mL/min   GFR, Est Non African American 76 >=60 mL/min  ALT  Result Value Ref Range   ALT 18 6 - 29 U/L  Lipid Panel  Result Value Ref Range   Cholesterol 170 <200 mg/dL   Triglycerides 199 (H) <150 mg/dL   HDL 54 >50 mg/dL   Total CHOL/HDL Ratio 3.1 <5.0 Ratio   VLDL 40 (H) <30 mg/dL   LDL Cholesterol 76 <100 mg/dL   Dg Hip Unilat With Pelvis 2-3 Views Right  Result Date: 12/06/2016 CLINICAL DATA:  Right hip pain beginning yesterday. EXAM: DG HIP (WITH OR WITHOUT PELVIS) 2-3V RIGHT COMPARISON:  None. FINDINGS: I think there is mild degenerative change of the right hip with mild acetabular sclerosis and possibly a small acetabular cyst laterally. Other regional structures are normal. 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 No acute finding otherwise. Electronically Signed   By: Nelson Chimes M.D.   On: 12/06/2016 18:28   Initial Impression / Assessment and Plan / ED Course  I have reviewed the triage vital signs and the nursing notes.  Pertinent labs & imaging results that were available during my care of the patient were reviewed by me and considered in my medical decision making (see chart for details).   declines pain medicine the ED  Plan Tylenol  for further discomfort heat or ice. Follow-up with PMD if having significant pain in a week  Final Clinical Impressions(s) / ED Diagnoses  Diagnosis right hip pain Final diagnoses:  Pain    New Prescriptions New Prescriptions   No medications on file     Orlie Dakin, MD 12/06/16 1926 7:25 PM requesting pain medicine. Tylenol offered the patient   Orlie Dakin, MD 12/06/16 [phone]

## 2016-12-06 NOTE — ED Triage Notes (Addendum)
C/o right hip pain x 2 days-denies recent injury-NAD-steady limping gait

## 2016-12-10 ENCOUNTER — Encounter: Payer: Self-pay | Admitting: Allergy and Immunology

## 2016-12-10 ENCOUNTER — Ambulatory Visit (INDEPENDENT_AMBULATORY_CARE_PROVIDER_SITE_OTHER): Payer: Medicare Other | Admitting: Allergy and Immunology

## 2016-12-10 VITALS — BP 112/64 | HR 75 | Resp 17

## 2016-12-10 DIAGNOSIS — J3089 Other allergic rhinitis: Secondary | ICD-10-CM | POA: Diagnosis not present

## 2016-12-10 DIAGNOSIS — K219 Gastro-esophageal reflux disease without esophagitis: Secondary | ICD-10-CM

## 2016-12-10 DIAGNOSIS — J324 Chronic pansinusitis: Secondary | ICD-10-CM | POA: Diagnosis not present

## 2016-12-10 NOTE — Progress Notes (Signed)
Follow-up Note  Referring Provider: Kirt Boys, DO Primary Provider: Kirt Boys, DO Date of Office Visit: 12/10/2016  Subjective:   Crystal Preston (DOB: Aug 25, 1940) is a 76 y.o. female [facility] in re-evaluation of the following:  HPI: Crystal Preston presents to this clinic in reevaluation of her allergic rhinitis and LPR and cough that was addressed during her last visit of 08/13/2016 at which point in time she was doing very well.  Apparently she did not do as well as described in April. She has been having problems that she believes relates back to February and has never completely cleared with head congestion and postnasal drip. Apparently last week she had a flareup with lots of ugly postnasal drip and slime in her mouth and smelling funny odors and some decreased ability to smell that has since improved now that she has started an antibiotic last week.  She has not been consistently using her medical therapy. She does use Flonase but she may not be using Singulair every day and she does use pantoprazole but she may not be using ranitidine every day. She has had evaluation with Dr. Haroldine Laws in the past but has never had a CT scan of her sinuses.  Allergies as of 12/10/2016      Reactions   Azithromycin Other (See Comments)   headache, excessive sleepiness   Naproxen    Nitrofuran Derivatives Other (See Comments)   Knots on body   Nitrofurantoin    Propoxyphene    Darvocet      Medication List      aspirin 81 MG tablet Take 81 mg by mouth daily.   CALCIUM CITRATE PO Take 1 tablet (1000 mg ) by mouth daily   cefdinir 300 MG capsule Commonly known as:  OMNICEF Take 1 capsule (300 mg total) by mouth 2 (two) times daily.   Cholecalciferol 4000 units Tabs Take 1 tablet by mouth daily   CLEAR EYES OP Apply to eye as needed.   Cod Liver Oil Caps Take 1 tablet by mouth daily   ESTER C PO Take 1 tablet (1000 mg ) by  mouth once a day   ferrous sulfate 325 (65 FE) MG EC tablet Take 325 mg by mouth daily with breakfast.   fluticasone 50 MCG/ACT nasal spray Commonly known as:  FLONASE Place 2 sprays into both nostrils daily.   ibandronate 150 MG tablet Commonly known as:  BONIVA Take 1 tablet (150 mg total) by mouth every 30 (thirty) days. Take in the morning with a full glass of water, on an empty stomach, and do not take anything else by mouth or lie down for the next 30 min.   losartan-hydrochlorothiazide 100-12.5 MG tablet Commonly known as:  HYZAAR TAKE 1 TABLET BY MOUTH EVERY DAY   montelukast 10 MG tablet Commonly known as:  SINGULAIR Take 1 tablet (10 mg total) by mouth at bedtime.   pantoprazole 40 MG tablet Commonly known as:  PROTONIX TAKE ONE TABLET BY MOUTH ONCE DAILY   potassium chloride SA 20 MEQ tablet Commonly known as:  K-DUR,KLOR-CON Take 1 tablet (20 mEq total) by mouth daily.   pravastatin 40 MG tablet Commonly known as:  PRAVACHOL Take 40 mg by mouth daily.   TYLENOL PO Take by mouth. Tylenol Body Pain Night- acetominophen and methocarbamol   TYLENOL 8 HOUR 650 MG CR tablet Generic drug:  acetaminophen Take 650 mg by mouth every 8 (eight) hours  as needed for pain.       Past Medical History:  Diagnosis Date  . GERD (gastroesophageal reflux disease)   . HTN (hypertension)   . Hyperlipidemia   . Insomnia, unspecified   . Obesity, unspecified   . Osteoarthritis   . Other abnormality of red blood cells    Microcytosis  . Peripheral vascular disease, unspecified (HCC)   . Vertigo, benign positional    Head to left side    Past Surgical History:  Procedure Laterality Date  . ABLATION ON ENDOMETRIOSIS  1980  . CHOLECYSTECTOMY, LAPAROSCOPIC  1989  . COLONOSCOPY  2008  . COLONOSCOPY  2013  . TOTAL ABDOMINAL HYSTERECTOMY  1971    Review of systems negative except as noted in HPI / PMHx or noted below:  Review of Systems  Constitutional: Negative.     HENT: Negative.   Eyes: Negative.   Respiratory: Negative.   Cardiovascular: Negative.   Gastrointestinal: Negative.   Genitourinary: Negative.   Musculoskeletal: Negative.   Skin: Negative.   Neurological: Negative.   Endo/Heme/Allergies: Negative.   Psychiatric/Behavioral: Negative.      Objective:   Vitals:   12/10/16 1505  BP: 112/64  Pulse: 75  Resp: 17  SpO2: 97%          Physical Exam  Constitutional: She is well-developed, well-nourished, and in no distress.  Slightly raspy voice  HENT:  Head: Normocephalic.  Right Ear: Tympanic membrane, external ear and ear canal normal.  Left Ear: Tympanic membrane, external ear and ear canal normal.  Nose: Nose normal. No mucosal edema or rhinorrhea.  Mouth/Throat: Uvula is midline, oropharynx is clear and moist and mucous membranes are normal. No oropharyngeal exudate.  Eyes: Conjunctivae are normal.  Neck: Trachea normal. No tracheal tenderness present. No tracheal deviation present. No thyromegaly present.  Cardiovascular: Normal rate, regular rhythm, S1 normal, S2 normal and normal heart sounds.   No murmur heard. Pulmonary/Chest: Breath sounds normal. No stridor. No respiratory distress. She has no wheezes. She has no rales.  Musculoskeletal: She exhibits no edema.  Lymphadenopathy:       Head (right side): No tonsillar adenopathy present.       Head (left side): No tonsillar adenopathy present.    She has no cervical adenopathy.  Neurological: She is alert.  Skin: No rash noted. She is not diaphoretic. No erythema. Nails show no clubbing.  Psychiatric: Mood and affect normal.    Diagnostics: none   Assessment and Plan:   1. Other allergic rhinitis   2. LPRD (laryngopharyngeal reflux disease)   3. Chronic pansinusitis     1. Continue to Treat inflammation:   A. Flonase 2 sprays each nostril one time per day  B. montelukast 10 mg tablet 1 time per day  2. Continue to Treat reflux:   A. pantoprazole  to 40 mg twice a day  B. ranitidine 300 mg in evening  3. If needed:   A. Cetirizine or Allegra 1 time per day  B. OTC Mucinex DM 2 tablets twice a day  C. nasal saline  4. Obtain sinus CT scan week of Labor day for chronic sinusitis  5. Return to clinic in 12 weeks or earlier if problem  Felina appears to have a status that is somewhat different from her last visit in April 2018. In retrospect she doesn't really think that she is that much better and still has persistant congestion and postnasal drip. I have encouraged her to consistently use the medical therapy  described above including aggressive treatment directed at upper airway inflammation and aggressive therapy directed against reflux. I think we are obligated to rule out chronic sinusitis with a CT scan and we will get that arranged for the beginning of next month. I would like to wait several weeks to make sure that the scan does not pick up her acute infection which appears to have developed over the course of the past 2 weeks.  Laurette Schimke, MD Allergy / Immunology [facility]

## 2016-12-10 NOTE — Patient Instructions (Addendum)
  1. Continue to Treat inflammation:   A. Flonase 2 sprays each nostril one time per day  B. montelukast 10 mg tablet 1 time per day  2. Continue to Treat reflux:   A. pantoprazole to 40 mg twice a day  B. ranitidine 300 mg in evening  3. If needed:   A. Cetirizine or Allegra 1 time per day  B. OTC Mucinex DM 2 tablets twice a day  C. nasal saline  4. Obtain sinus CT scan week of Labor day for chronic sinusitis  5. Return to clinic in 12 weeks or earlier if problem

## 2016-12-12 ENCOUNTER — Telehealth: Payer: Self-pay | Admitting: Allergy and Immunology

## 2016-12-12 NOTE — Telephone Encounter (Signed)
Spoke with patient about the sinus CT scan and she will be calling [facility] to set up an appointment that best suits her schedule.

## 2016-12-12 NOTE — Telephone Encounter (Signed)
Pt called and said that Dr Lucie Leather was going to schedule for her to go for a Ct_Scan.[id number].

## 2016-12-13 ENCOUNTER — Other Ambulatory Visit: Payer: Self-pay | Admitting: Internal Medicine

## 2016-12-13 DIAGNOSIS — J302 Other seasonal allergic rhinitis: Secondary | ICD-10-CM

## 2016-12-19 ENCOUNTER — Ambulatory Visit
Admission: RE | Admit: 2016-12-19 | Discharge: 2016-12-19 | Disposition: A | Discharge status: Home / Self Care | Payer: Medicare Other | Source: Ambulatory Visit | Attending: Allergy and Immunology | Admitting: Allergy and Immunology

## 2016-12-19 DIAGNOSIS — J329 Chronic sinusitis, unspecified: Secondary | ICD-10-CM | POA: Diagnosis not present

## 2016-12-25 ENCOUNTER — Other Ambulatory Visit: Payer: Self-pay | Admitting: *Deleted

## 2016-12-25 MED ORDER — AMOXICILLIN-POT CLAVULANATE 875-125 MG PO TABS: 1.0000 | ORAL_TABLET | Freq: Two times a day (BID) | ORAL | 0 refills | Status: AC

## 2017-01-07 DIAGNOSIS — M545 Low back pain: Secondary | ICD-10-CM | POA: Diagnosis not present

## 2017-01-07 DIAGNOSIS — M1611 Unilateral primary osteoarthritis, right hip: Secondary | ICD-10-CM | POA: Diagnosis not present

## 2017-01-28 ENCOUNTER — Other Ambulatory Visit: Payer: Self-pay | Admitting: *Deleted

## 2017-01-28 MED ORDER — LOSARTAN POTASSIUM-HCTZ 100-12.5 MG PO TABS: 1.0000 | ORAL_TABLET | Freq: Every day | ORAL | 1 refills | Status: DC

## 2017-01-28 NOTE — Telephone Encounter (Signed)
Patient requested refill

## 2017-02-05 ENCOUNTER — Other Ambulatory Visit: Payer: Self-pay | Admitting: Internal Medicine

## 2017-02-05 DIAGNOSIS — M810 Age-related osteoporosis without current pathological fracture: Secondary | ICD-10-CM

## 2017-02-18 ENCOUNTER — Other Ambulatory Visit: Payer: Self-pay | Admitting: *Deleted

## 2017-02-18 MED ORDER — PRAVASTATIN SODIUM 40 MG PO TABS: 40.0000 mg | ORAL_TABLET | Freq: Every day | ORAL | 1 refills | Status: DC

## 2017-02-18 NOTE — Telephone Encounter (Signed)
Patient called requesting refill to be sent to pharmacy. Faxed.

## 2017-03-05 ENCOUNTER — Ambulatory Visit: Payer: Medicare Other | Admitting: Internal Medicine

## 2017-03-11 ENCOUNTER — Encounter: Payer: Self-pay | Admitting: Allergy and Immunology

## 2017-03-11 ENCOUNTER — Emergency Department (HOSPITAL_BASED_OUTPATIENT_CLINIC_OR_DEPARTMENT_OTHER): Payer: Medicare Other

## 2017-03-11 ENCOUNTER — Other Ambulatory Visit: Payer: Self-pay

## 2017-03-11 ENCOUNTER — Encounter (HOSPITAL_BASED_OUTPATIENT_CLINIC_OR_DEPARTMENT_OTHER): Payer: Self-pay | Admitting: *Deleted

## 2017-03-11 ENCOUNTER — Ambulatory Visit: Payer: Medicare Other | Admitting: Allergy and Immunology

## 2017-03-11 ENCOUNTER — Emergency Department (HOSPITAL_BASED_OUTPATIENT_CLINIC_OR_DEPARTMENT_OTHER)
Admission: EM | Admit: 2017-03-11 | Discharge: 2017-03-11 | Disposition: A | Discharge status: Home / Self Care | Payer: Medicare Other | Attending: Emergency Medicine | Admitting: Emergency Medicine

## 2017-03-11 VITALS — BP 116/74 | HR 78 | Resp 17

## 2017-03-11 DIAGNOSIS — Z7902 Long term (current) use of antithrombotics/antiplatelets: Secondary | ICD-10-CM | POA: Diagnosis not present

## 2017-03-11 DIAGNOSIS — R05 Cough: Secondary | ICD-10-CM | POA: Diagnosis not present

## 2017-03-11 DIAGNOSIS — T50905A Adverse effect of unspecified drugs, medicaments and biological substances, initial encounter: Secondary | ICD-10-CM

## 2017-03-11 DIAGNOSIS — J324 Chronic pansinusitis: Secondary | ICD-10-CM | POA: Diagnosis not present

## 2017-03-11 DIAGNOSIS — Z79899 Other long term (current) drug therapy: Secondary | ICD-10-CM | POA: Diagnosis not present

## 2017-03-11 DIAGNOSIS — R531 Weakness: Secondary | ICD-10-CM | POA: Diagnosis not present

## 2017-03-11 DIAGNOSIS — I10 Essential (primary) hypertension: Secondary | ICD-10-CM | POA: Diagnosis not present

## 2017-03-11 DIAGNOSIS — K219 Gastro-esophageal reflux disease without esophagitis: Secondary | ICD-10-CM | POA: Diagnosis not present

## 2017-03-11 DIAGNOSIS — J3089 Other allergic rhinitis: Secondary | ICD-10-CM

## 2017-03-11 DIAGNOSIS — Z7982 Long term (current) use of aspirin: Secondary | ICD-10-CM | POA: Insufficient documentation

## 2017-03-11 DIAGNOSIS — R42 Dizziness and giddiness: Secondary | ICD-10-CM | POA: Diagnosis not present

## 2017-03-11 LAB — CBC WITH DIFFERENTIAL/PLATELET
BASOS PCT: 0 %
Basophils Absolute: 0 10*3/uL (ref 0.0–0.1)
EOS PCT: 1 %
Eosinophils Absolute: 0.2 10*3/uL (ref 0.0–0.7)
HEMATOCRIT: 34 % — AB (ref 36.0–46.0)
HEMOGLOBIN: 11.1 g/dL — AB (ref 12.0–15.0)
LYMPHS PCT: 15 %
Lymphs Abs: 2.5 10*3/uL (ref 0.7–4.0)
MCH: 23.1 pg — AB (ref 26.0–34.0)
MCHC: 32.6 g/dL (ref 30.0–36.0)
MCV: 70.7 fL — AB (ref 78.0–100.0)
Monocytes Absolute: 1.2 10*3/uL — ABNORMAL HIGH (ref 0.1–1.0)
Monocytes Relative: 7 %
NEUTROS PCT: 77 %
Neutro Abs: 12.9 10*3/uL — ABNORMAL HIGH (ref 1.7–7.7)
Platelets: 319 10*3/uL (ref 150–400)
RBC: 4.81 MIL/uL (ref 3.87–5.11)
RDW: 14.5 % (ref 11.5–15.5)
WBC: 16.8 10*3/uL — ABNORMAL HIGH (ref 4.0–10.5)

## 2017-03-11 LAB — URINALYSIS, ROUTINE W REFLEX MICROSCOPIC
BILIRUBIN URINE: NEGATIVE
Glucose, UA: NEGATIVE mg/dL
Ketones, ur: NEGATIVE mg/dL
Leukocytes, UA: NEGATIVE
Nitrite: NEGATIVE
Protein, ur: NEGATIVE mg/dL
pH: 7 (ref 5.0–8.0)

## 2017-03-11 LAB — COMPREHENSIVE METABOLIC PANEL
ALK PHOS: 57 U/L (ref 38–126)
ALT: 21 U/L (ref 14–54)
AST: 24 U/L (ref 15–41)
Albumin: 3.9 g/dL (ref 3.5–5.0)
Anion gap: 9 (ref 5–15)
BUN: 10 mg/dL (ref 6–20)
CALCIUM: 9.2 mg/dL (ref 8.9–10.3)
CO2: 25 mmol/L (ref 22–32)
CREATININE: 0.71 mg/dL (ref 0.44–1.00)
Chloride: 101 mmol/L (ref 101–111)
Glucose, Bld: 106 mg/dL — ABNORMAL HIGH (ref 65–99)
Potassium: 3.5 mmol/L (ref 3.5–5.1)
Sodium: 135 mmol/L (ref 135–145)
Total Bilirubin: 0.4 mg/dL (ref 0.3–1.2)
Total Protein: 7.2 g/dL (ref 6.5–8.1)

## 2017-03-11 LAB — URINALYSIS, MICROSCOPIC (REFLEX): WBC, UA: NONE SEEN WBC/hpf (ref 0–5)

## 2017-03-11 LAB — TROPONIN I

## 2017-03-11 MED ORDER — SODIUM CHLORIDE 0.9 % IV BOLUS (SEPSIS)
500.0000 mL | Freq: Once | INTRAVENOUS | Status: AC
  Administered 2017-03-11: 500 mL via INTRAVENOUS

## 2017-03-11 MED ORDER — NYSTATIN 100000 UNIT/ML MT SUSP: 5.0000 mL | Freq: Three times a day (TID) | OROMUCOSAL | 0 refills | Status: AC

## 2017-03-11 MED ORDER — METHYLPREDNISOLONE ACETATE 80 MG/ML IJ SUSP
80.0000 mg | Freq: Once | INTRAMUSCULAR | Status: AC
  Administered 2017-03-11: 80 mg via INTRAMUSCULAR

## 2017-03-11 NOTE — ED Provider Notes (Signed)
Complaint lightheadedness for 2 days her daughter reports she had a occipital headache earlier today which has since resolved.  She is presently asymptomatic patient is alert and in no distress ambulates without difficulty.  She feels ready to go home ED ECG REPORT   Date: 03/11/2017  Rate: 70  Rhythm: normal sinus rhythm  QRS Axis: normal  Intervals: normal  ST/T Wave abnormalities: nonspecific T wave changes  Conduction Disutrbances:none  Narrative Interpretation:   Old EKG Reviewed: none available  I have personally reviewed the EKG tracing and agree with the computerized printout as noted. Results for orders placed or performed during the hospital encounter of 03/11/17  CBC with Differential/Platelet  Result Value Ref Range   WBC 16.8 (H) 4.0 - 10.5 K/uL   RBC 4.81 3.87 - 5.11 MIL/uL   Hemoglobin 11.1 (L) 12.0 - 15.0 g/dL   HCT 69.634.0 (L) 29.536.0 - 28.446.0 %   MCV 70.7 (L) 78.0 - 100.0 fL   MCH 23.1 (L) 26.0 - 34.0 pg   MCHC 32.6 30.0 - 36.0 g/dL   RDW 13.214.5 44.011.5 - 10.215.5 %   Platelets 319 150 - 400 K/uL   Neutrophils Relative % 77 %   Lymphocytes Relative 15 %   Monocytes Relative 7 %   Eosinophils Relative 1 %   Basophils Relative 0 %   Neutro Abs 12.9 (H) 1.7 - 7.7 K/uL   Lymphs Abs 2.5 0.7 - 4.0 K/uL   Monocytes Absolute 1.2 (H) 0.1 - 1.0 K/uL   Eosinophils Absolute 0.2 0.0 - 0.7 K/uL   Basophils Absolute 0.0 0.0 - 0.1 K/uL   RBC Morphology STOMATOCYTES   Comprehensive metabolic panel  Result Value Ref Range   Sodium 135 135 - 145 mmol/L   Potassium 3.5 3.5 - 5.1 mmol/L   Chloride 101 101 - 111 mmol/L   CO2 25 22 - 32 mmol/L   Glucose, Bld 106 (H) 65 - 99 mg/dL   BUN 10 6 - 20 mg/dL   Creatinine, Ser 7.250.71 0.44 - 1.00 mg/dL   Calcium 9.2 8.9 - 36.610.3 mg/dL   Total Protein 7.2 6.5 - 8.1 g/dL   Albumin 3.9 3.5 - 5.0 g/dL   AST 24 15 - 41 U/L   ALT 21 14 - 54 U/L   Alkaline Phosphatase 57 38 - 126 U/L   Total Bilirubin 0.4 0.3 - 1.2 mg/dL   GFR calc non Af Amer >60 >60  mL/min   GFR calc Af Amer >60 >60 mL/min   Anion gap 9 5 - 15  Troponin I  Result Value Ref Range   Troponin I <0.03 <0.03 ng/mL  Urinalysis, Routine w reflex microscopic  Result Value Ref Range   Color, Urine YELLOW YELLOW   APPearance CLEAR CLEAR   Specific Gravity, Urine <1.005 (L) 1.005 - 1.030   pH 7.0 5.0 - 8.0   Glucose, UA NEGATIVE NEGATIVE mg/dL   Hgb urine dipstick TRACE (A) NEGATIVE   Bilirubin Urine NEGATIVE NEGATIVE   Ketones, ur NEGATIVE NEGATIVE mg/dL   Protein, ur NEGATIVE NEGATIVE mg/dL   Nitrite NEGATIVE NEGATIVE   Leukocytes, UA NEGATIVE NEGATIVE  Urinalysis, Microscopic (reflex)  Result Value Ref Range   RBC / HPF 0-5 0 - 5 RBC/hpf   WBC, UA NONE SEEN 0 - 5 WBC/hpf   Bacteria, UA RARE (A) NONE SEEN   Squamous Epithelial / LPF 0-5 (A) NONE SEEN   Mucus PRESENT    Dg Chest 2 View  Result Date: 03/11/2017 CLINICAL  DATA:  Cough with dizziness EXAM: CHEST  2 VIEW COMPARISON:  July 26, 2004 FINDINGS: There is no edema or consolidation. The heart size and pulmonary vascularity are normal. No adenopathy. There is aortic atherosclerosis. There is degenerative change in the thoracic spine. IMPRESSION: Aortic atherosclerosis.  No edema or consolidation. Aortic Atherosclerosis (ICD10-I70.0). Electronically Signed   By: Bretta BangWilliam  Woodruff III M.D.   On: 03/11/2017 13:38   Ct Head Wo Contrast  Result Date: 03/11/2017 CLINICAL DATA:  Generalized muscle weakness, dizziness for 2 days, urgency urinate, chills, funny feeling in her head "like sparks", history hypertension EXAM: CT HEAD WITHOUT CONTRAST CT MAXILLOFACIAL WITHOUT CONTRAST TECHNIQUE: Multidetector CT imaging of the head and maxillofacial structures were performed using the standard protocol without intravenous contrast. Multiplanar CT image reconstructions of the maxillofacial structures were also generated. RIGHT-side of face marked with a vitamin E capsule. COMPARISON:  Sinus CT 12/19/2016 FINDINGS: CT HEAD FINDINGS  Brain: Generalized atrophy. Normal ventricular morphology. No midline shift or mass effect. Small vessel chronic ischemic changes of deep cerebral white matter. No intracranial hemorrhage, mass lesion, evidence of acute infarction, or extra-axial fluid collection. Vascular: Mild atherosclerotic calcifications bilaterally of the internal carotid arteries at the skullbase Skull: Intact. Small nonspecific lucency in the frontal bone RIGHT of midline 6 mm diameter Other: N/A CT MAXILLOFACIAL FINDINGS Osseous: Scattered beam hardening artifacts of dental origin. Osseous mineralization normal. No fracture or bone destruction. Orbits: Intraorbital soft tissue planes clear. Sinuses: Nasal septum midline. Paranasal sinuses, mastoid air cells, and middle ear cavities clear Soft tissues: Nonspecific soft tissue calcification medial to the RIGHT mandible 14 x 7 mm image 24. Remaining regional soft tissues unremarkable. IMPRESSION: Atrophy with mild small vessel chronic ischemic changes of deep cerebral white matter. No acute intracranial abnormalities. Unremarkable CT appearance of the sinuses. Electronically Signed   By: Ulyses SouthwardMark  Boles M.D.   On: 03/11/2017 16:06   Ct Maxillofacial Wo Contrast  Result Date: 03/11/2017 CLINICAL DATA:  Generalized muscle weakness, dizziness for 2 days, urgency urinate, chills, funny feeling in her head "like sparks", history hypertension EXAM: CT HEAD WITHOUT CONTRAST CT MAXILLOFACIAL WITHOUT CONTRAST TECHNIQUE: Multidetector CT imaging of the head and maxillofacial structures were performed using the standard protocol without intravenous contrast. Multiplanar CT image reconstructions of the maxillofacial structures were also generated. RIGHT-side of face marked with a vitamin E capsule. COMPARISON:  Sinus CT 12/19/2016 FINDINGS: CT HEAD FINDINGS Brain: Generalized atrophy. Normal ventricular morphology. No midline shift or mass effect. Small vessel chronic ischemic changes of deep cerebral  white matter. No intracranial hemorrhage, mass lesion, evidence of acute infarction, or extra-axial fluid collection. Vascular: Mild atherosclerotic calcifications bilaterally of the internal carotid arteries at the skullbase Skull: Intact. Small nonspecific lucency in the frontal bone RIGHT of midline 6 mm diameter Other: N/A CT MAXILLOFACIAL FINDINGS Osseous: Scattered beam hardening artifacts of dental origin. Osseous mineralization normal. No fracture or bone destruction. Orbits: Intraorbital soft tissue planes clear. Sinuses: Nasal septum midline. Paranasal sinuses, mastoid air cells, and middle ear cavities clear Soft tissues: Nonspecific soft tissue calcification medial to the RIGHT mandible 14 x 7 mm image 24. Remaining regional soft tissues unremarkable. IMPRESSION: Atrophy with mild small vessel chronic ischemic changes of deep cerebral white matter. No acute intracranial abnormalities. Unremarkable CT appearance of the sinuses. Electronically Signed   By: Ulyses SouthwardMark  Boles M.D.   On: 03/11/2017 16:06  planfollow-up with PMD as needed   Doug SouJacubowitz, Andrey Hoobler, MD 03/11/17 [phone]

## 2017-03-11 NOTE — ED Notes (Signed)
For years Pt. Reports a ringing in her ears and has been tested for this.

## 2017-03-11 NOTE — Progress Notes (Signed)
Follow-up Note  Referring Provider: Kirt Boys, DO Primary Provider: Kirt Boys, DO Date of Office Visit: 03/11/2017  Subjective:   Crystal Preston (DOB: [birth year]) is a 76 y.o. female who [facility] in re-evaluation of the following:  HPI: Zyair presents to this clinic in reevaluation of her allergic rhinitis and chronic sinusitis and LPR.  I last saw her in this clinic on 10 December 2016.  She utilized a course of Augmentin and had a follow-up sinus CT scan this summer which continued to showed a small amount to sinusitis for which she had an additional course of Augmentin and completely resolved all of her respiratory tract symptoms in the face of that therapy.  She did very well for a prolonged period and while consistently utilizing therapy directed against both inflammation of her airway and reflux.  However, over the course of the past 2 weeks or so she feels as though there is something going on inside her mouth.  Her mouth feels very sore and her gums feel irritated.  It should be noted that she started nabumetone about 2 weeks prior to the onset of this issue.  Allergies as of 03/11/2017      Reactions   Azithromycin Other (See Comments)   headache, excessive sleepiness   Naproxen    Nitrofuran Derivatives Other (See Comments)   Knots on body   Nitrofurantoin    Propoxyphene    Darvocet      Medication List      aspirin 81 MG tablet Take 81 mg by mouth daily.   CALCIUM CITRATE PO Take 1 tablet (1000 mg ) by mouth daily   Cholecalciferol 4000 units Tabs Take 1 tablet by mouth daily   CLEAR EYES OP Apply to eye as needed.   Cod Liver Oil Caps Take 1 tablet by mouth daily   ESTER C PO Take 1 tablet (1000 mg ) by mouth once a day   ferrous sulfate 325 (65 FE) MG EC tablet Take 325 mg by mouth daily with breakfast.   fluticasone 50 MCG/ACT nasal spray Commonly known as:  FLONASE SPRAY 2 SPRAYS INTO  EACH NOSTRIL EVERY DAY   ibandronate 150 MG tablet Commonly known as:  BONIVA TAKE 1 EVERY 30 DAYS IN THE AM WITH FULL GLASS OF WATER ON EMPTY STOMACH. DO NOT LIE DOWN X30 MINUTE   losartan-hydrochlorothiazide 100-12.5 MG tablet Commonly known as:  HYZAAR Take 1 tablet by mouth daily.   montelukast 10 MG tablet Commonly known as:  SINGULAIR Take 1 tablet (10 mg total) by mouth at bedtime.   nystatin 100000 UNIT/ML suspension Commonly known as:  MYCOSTATIN Take 5 mLs (500,000 Units total) by mouth 3 (three) times daily for 10 days.   pantoprazole 40 MG tablet Commonly known as:  PROTONIX TAKE ONE TABLET BY MOUTH ONCE DAILY   potassium chloride SA 20 MEQ tablet Commonly known as:  K-DUR,KLOR-CON Take 1 tablet (20 mEq total) by mouth daily.   pravastatin 40 MG tablet Commonly known as:  PRAVACHOL Take 1 tablet (40 mg total) by mouth daily.   TYLENOL PO Take by mouth. Tylenol Body Pain Night- acetominophen and methocarbamol   TYLENOL 8 HOUR 650 MG CR tablet Generic drug:  acetaminophen Take 650 mg by mouth every 8 (eight) hours as needed for pain.       Past Medical History:  Diagnosis Date  . GERD (gastroesophageal reflux disease)   . HTN (hypertension)   .  Hyperlipidemia   . Insomnia, unspecified   . Obesity, unspecified   . Osteoarthritis   . Other abnormality of red blood cells    Microcytosis  . Peripheral vascular disease, unspecified (HCC)   . Vertigo, benign positional    Head to left side    Past Surgical History:  Procedure Laterality Date  . ABLATION ON ENDOMETRIOSIS  1980  . CHOLECYSTECTOMY, LAPAROSCOPIC  1989  . COLONOSCOPY  2008  . COLONOSCOPY  2013  . TOTAL ABDOMINAL HYSTERECTOMY  1971    Review of systems negative except as noted in HPI / PMHx or noted below:  Review of Systems  Constitutional: Negative.   HENT: Negative.   Eyes: Negative.   Respiratory: Negative.   Cardiovascular: Negative.   Gastrointestinal: Negative.     Genitourinary: Negative.   Musculoskeletal: Negative.   Skin: Negative.   Neurological: Negative.   Endo/Heme/Allergies: Negative.   Psychiatric/Behavioral: Negative.      Objective:   Vitals:   03/11/17 0940  BP: 116/74  Pulse: 78  Resp: 17  SpO2: 97%          Physical Exam  Constitutional: She is well-developed, well-nourished, and in no distress.  HENT:  Head: Normocephalic.  Right Ear: Tympanic membrane, external ear and ear canal normal.  Left Ear: Tympanic membrane, external ear and ear canal normal.  Nose: Nose normal. No mucosal edema or rhinorrhea.  Mouth/Throat: Uvula is midline.  Diffusely red gums, extremely erythematous ulcerative area involving soft and hard palate.  Eyes: Conjunctivae are normal.  Neck: Trachea normal. No tracheal tenderness present. No tracheal deviation present. No thyromegaly present.  Cardiovascular: Normal rate, regular rhythm, S1 normal, S2 normal and normal heart sounds.  No murmur heard. Pulmonary/Chest: Breath sounds normal. No stridor. No respiratory distress. She has no wheezes. She has no rales.  Musculoskeletal: She exhibits no edema.  Lymphadenopathy:       Head (right side): No tonsillar adenopathy present.       Head (left side): No tonsillar adenopathy present.    She has no cervical adenopathy.  Neurological: She is alert. Gait normal.  Skin: No rash noted. She is not diaphoretic. No erythema. Nails show no clubbing.  Psychiatric: Mood and affect normal.    Diagnostics:   Results of a sinus CT scan obtained 19 December 2016 identified the following:  Sinuses: Left maxillary sinus retention cyst and mild mucosal thickening. The remainder of the paranasal sinuses are normally pneumatized.  Assessment and Plan:   1. Other allergic rhinitis   2. Chronic pansinusitis   3. LPRD (laryngopharyngeal reflux disease)   4. Adverse drug effect, initial encounter     1. Continue to Treat inflammation:   A. Flonase 1-2  sprays each nostril one time per day  2. Continue to Treat reflux:   A. pantoprazole to 40 mg twice a day  3. If needed:   A. Cetirizine or Allegra 1 time per day  B. OTC Mucinex DM 2 tablets twice a day  C. nasal saline  4. Treat mouth issue:   A. DISCONTINUE nabumetone  B.  Depo-Medrol 80 IM delivered in clinic today  C. Nystatin oral - 5 mls 3 times a day for 10 days  5. Return to clinic in 6 months or earlier if problem  Gayla DossLessie may be having a side effect from her nabumetone with an ulcerative reaction inside her mouth and I have instructed her to discontinue this agent and I have given her some systemic steroids and  I have also given her nystatin oral solution as she has been having some issues in the past with thrush.  Of course, her ulcerative lesion could be a manifestation of a immuno bullous disease and we may need to explore that diagnostic possibility if this issue does not resolve with the therapy noted above.  She will continue on treatment for reflux and inflammation of her airway as noted above.  I will see her back in this clinic in 6 months or earlier if there is a problem.  Laurette SchimkeEric Rylann Munford, MD Allergy / Immunology [facility]

## 2017-03-11 NOTE — Patient Instructions (Addendum)
  1. Continue to Treat inflammation:   A. Flonase 1-2 sprays each nostril one time per day  2. Continue to Treat reflux:   A. pantoprazole to 40 mg twice a day  3. If needed:   A. Cetirizine or Allegra 1 time per day  B. OTC Mucinex DM 2 tablets twice a day  C. nasal saline  4. Treat mouth issue:   A. DISCONTINUE nabumetone  B.  Depo-Medrol 80 IM delivered in clinic today  C. Nystatin oral - 5 mls 3 times a day for 10 days  5. Return to clinic in 6 months or earlier if problem

## 2017-03-11 NOTE — ED Triage Notes (Addendum)
Dizziness and weakness. Urgency to urinate. Chills. Funny feeling in her head that felt like sparks. Symptoms started last night. She was seen by her allergist before coming here and told him about her symptoms and she was recommended to come here.

## 2017-03-11 NOTE — Discharge Instructions (Signed)
Make sure that you drink at least six 8 ounce glasses of water  each day in order to stay well-hydrated.  Follow-up with your primary care physician if you feel badly or return if concern for any reason

## 2017-03-11 NOTE — ED Provider Notes (Signed)
[facility] EMERGENCY DEPARTMENT Provider Note   CSN: [id number] Arrival date & time: 03/11/17  1244     History   Chief Complaint Chief Complaint  Patient presents with  . Dizziness  . Weakness    HPI Crystal Preston is a 76 y.o. female.  HPI Patient presents with multiple vague complaints started yesterday evening.  Complains of generalized weakness and lightheadedness.  She has had intermittent cough without sputum production.  She also has had several episodes of urinary urgency but denies any dysuria.  No nausea or vomiting.  Seen by her allergist today and advised to go to the emergency department. Past Medical History:  Diagnosis Date  . GERD (gastroesophageal reflux disease)   . HTN (hypertension)   . Hyperlipidemia   . Insomnia, unspecified   . Obesity, unspecified   . Osteoarthritis   . Other abnormality of red blood cells    Microcytosis  . Peripheral vascular disease, unspecified (HCC)   . Vertigo, benign positional    Head to left side    Patient Active Problem List   Diagnosis Date Noted  . High risk medication use 11/26/2016  . Acute bronchitis 07/17/2016  . Essential hypertension 02/28/2016  . Age related osteoporosis 02/28/2016  . Estrogen deficiency 02/28/2016  . Primary osteoarthritis involving multiple joints 02/28/2016  . Gastroesophageal reflux disease without esophagitis 02/28/2016  . Hyperlipidemia 02/28/2016    Past Surgical History:  Procedure Laterality Date  . ABLATION ON ENDOMETRIOSIS  1980  . CHOLECYSTECTOMY, LAPAROSCOPIC  1989  . COLONOSCOPY  2008  . COLONOSCOPY  2013  . TOTAL ABDOMINAL HYSTERECTOMY  1971    OB History    No data available       Home Medications    Prior to Admission medications   Medication Sig Start Date End Date Taking? Authorizing Provider  acetaminophen (TYLENOL 8 HOUR) 650 MG CR tablet Take 650 mg by mouth every 8 (eight) hours as needed for pain.    [provider]    Acetaminophen (TYLENOL PO) Take by mouth. Tylenol Body Pain Night- acetominophen and methocarbamol    [provider]  aspirin 81 MG tablet Take 81 mg by mouth daily.    [provider]  Bioflavonoid Products (ESTER C PO) Take 1 tablet (1000 mg ) by mouth once a day    [provider]  CALCIUM CITRATE PO Take 1 tablet (1000 mg ) by mouth daily    [provider]  Cholecalciferol 4000 units TABS Take 1 tablet by mouth daily    [provider]  Cod Liver Oil CAPS Take 1 tablet by mouth daily    [provider]  ferrous sulfate 325 (65 FE) MG EC tablet Take 325 mg by mouth daily with breakfast.    [provider]  fluticasone (FLONASE) 50 MCG/ACT nasal spray SPRAY 2 SPRAYS INTO EACH NOSTRIL EVERY DAY 12/16/16   Kirt Boys, DO  ibandronate (BONIVA) 150 MG tablet TAKE 1 EVERY 30 DAYS IN THE AM WITH FULL GLASS OF WATER ON EMPTY STOMACH. DO NOT LIE DOWN X30 MINUTE 02/05/17   Kirt Boys, DO  losartan-hydrochlorothiazide (HYZAAR) 100-12.5 MG tablet Take 1 tablet by mouth daily. 01/28/17   Kirt Boys, DO  montelukast (SINGULAIR) 10 MG tablet Take 1 tablet (10 mg total) by mouth at bedtime. 07/30/16   Kozlow, Alvira Philips, MD  Naphazoline HCl (CLEAR EYES OP) Apply to eye as needed.     [provider]  nystatin (MYCOSTATIN) 100000  UNIT/ML suspension Take 5 mLs (500,000 Units total) by mouth 3 (three) times daily for 10 days. 03/11/17 03/21/17  Kozlow, Alvira Philips, MD  pantoprazole (PROTONIX) 40 MG tablet TAKE ONE TABLET BY MOUTH ONCE DAILY 11/18/16   Kirt Boys, DO  potassium chloride SA (K-DUR,KLOR-CON) 20 MEQ tablet Take 1 tablet (20 mEq total) by mouth daily. 12/03/16   Kirt Boys, DO  pravastatin (PRAVACHOL) 40 MG tablet Take 1 tablet (40 mg total) by mouth daily. 02/18/17   Kirt Boys, DO    Family History Family History  Problem Relation Age of Onset  . Cancer Father 61  . Cancer Mother 44       cervical cancer  .  Glaucoma Brother     Social History Social History   Tobacco Use  . Smoking status: Never Smoker  . Smokeless tobacco: Never Used  Substance Use Topics  . Alcohol use: No  . Drug use: No     Allergies   Azithromycin; Naproxen; Nitrofuran derivatives; Nitrofurantoin; and Propoxyphene   Review of Systems Review of Systems  Constitutional: Positive for chills, fatigue and fever.  HENT: Negative for congestion and sore throat.   Eyes: Negative for visual disturbance.  Respiratory: Positive for cough. Negative for chest tightness and shortness of breath.   Cardiovascular: Negative for chest pain, palpitations and leg swelling.  Gastrointestinal: Negative for abdominal pain, diarrhea, nausea and vomiting.  Genitourinary: Positive for urgency. Negative for dysuria, flank pain and pelvic pain.  Musculoskeletal: Negative for back pain, myalgias, neck pain and neck stiffness.  Skin: Negative for rash and wound.  Neurological: Positive for dizziness and light-headedness. Negative for syncope, weakness, numbness and headaches.  All other systems reviewed and are negative.    Physical Exam Updated Vital Signs BP (!) 125/58 (BP Location: Right Arm)   Pulse 66   Temp 98.9 F (37.2 C) (Oral)   Resp 16   Ht 4\' 11"  (1.499 m)   Wt 87.1 kg (192 lb)   SpO2 98%   BMI 38.78 kg/m   Physical Exam  Constitutional: She is oriented to person, place, and time. She appears well-developed and well-nourished. No distress.  HENT:  Head: Normocephalic and atraumatic.  Mouth/Throat: Oropharynx is clear and moist. No oropharyngeal exudate.  Bilateral nasal mucosal edema.  Eyes: EOM are normal. Pupils are equal, round, and reactive to light.  Few beats of horizontal nystagmus  Neck: Normal range of motion. Neck supple.  No meningismus.  Cardiovascular: Normal rate and regular rhythm. Exam reveals no gallop and no friction rub.  No murmur heard. Pulmonary/Chest: Effort normal and breath sounds  normal. No stridor. No respiratory distress. She has no wheezes. She has no rales. She exhibits no tenderness.  Abdominal: Soft. Bowel sounds are normal. There is tenderness. There is no rebound and no guarding.  Mild suprapubic discomfort to palpation.  Musculoskeletal: Normal range of motion. She exhibits no edema or tenderness.  No CVA tenderness.  No lower extremity swelling, asymmetry or tenderness.  Neurological: She is alert and oriented to person, place, and time.  Patient is alert and oriented x3 with clear, goal oriented speech. Patient has 5/5 motor in all extremities. Sensation is intact to light touch. Bilateral finger-to-nose is normal with no signs of dysmetria.  Skin: Skin is warm and dry. Capillary refill takes less than 2 seconds. No rash noted. No erythema.  Psychiatric: She has a normal mood and affect. Her behavior is normal.  Nursing note and vitals reviewed.    ED  Treatments / Results  Labs (all labs ordered are listed, but only abnormal results are displayed) Labs Reviewed  CBC WITH DIFFERENTIAL/PLATELET - Abnormal; Notable for the following components:      Result Value   WBC 16.8 (*)    Hemoglobin 11.1 (*)    HCT 34.0 (*)    MCV 70.7 (*)    MCH 23.1 (*)    Neutro Abs 12.9 (*)    Monocytes Absolute 1.2 (*)    All other components within normal limits  COMPREHENSIVE METABOLIC PANEL - Abnormal; Notable for the following components:   Glucose, Bld 106 (*)    All other components within normal limits  URINALYSIS, ROUTINE W REFLEX MICROSCOPIC - Abnormal; Notable for the following components:   Specific Gravity, Urine <1.005 (*)    Hgb urine dipstick TRACE (*)    All other components within normal limits  URINALYSIS, MICROSCOPIC (REFLEX) - Abnormal; Notable for the following components:   Bacteria, UA RARE (*)    Squamous Epithelial / LPF 0-5 (*)    All other components within normal limits  TROPONIN I    EKG  EKG Interpretation  Date/Time:  Tuesday  March 11 2017 13:44:09 EST Ventricular Rate:  70 PR Interval:    QRS Duration: 89 QT Interval:  416 QTC Calculation: 449 R Axis:   3 Text Interpretation:  Sinus rhythm Low voltage, precordial leads Borderline T abnormalities, anterior leads No old tracing to compare Confirmed by Pond CreekJacubowitz, Doreatha MartinSam [phone]([id number]) on 03/11/2017 4:39:12 PM Also confirmed by Doug SouJacubowitz, Sam [phone]([id number])  on 03/11/2017 4:40:42 PM       Radiology Dg Chest 2 View  Result Date: 03/11/2017 CLINICAL DATA:  Cough with dizziness EXAM: CHEST  2 VIEW COMPARISON:  July 26, 2004 FINDINGS: There is no edema or consolidation. The heart size and pulmonary vascularity are normal. No adenopathy. There is aortic atherosclerosis. There is degenerative change in the thoracic spine. IMPRESSION: Aortic atherosclerosis.  No edema or consolidation. Aortic Atherosclerosis (ICD10-I70.0). Electronically Signed   By: Bretta BangWilliam  Woodruff III M.D.   On: 03/11/2017 13:38   Ct Head Wo Contrast  Result Date: 03/11/2017 CLINICAL DATA:  Generalized muscle weakness, dizziness for 2 days, urgency urinate, chills, funny feeling in her head "like sparks", history hypertension EXAM: CT HEAD WITHOUT CONTRAST CT MAXILLOFACIAL WITHOUT CONTRAST TECHNIQUE: Multidetector CT imaging of the head and maxillofacial structures were performed using the standard protocol without intravenous contrast. Multiplanar CT image reconstructions of the maxillofacial structures were also generated. RIGHT-side of face marked with a vitamin E capsule. COMPARISON:  Sinus CT 12/19/2016 FINDINGS: CT HEAD FINDINGS Brain: Generalized atrophy. Normal ventricular morphology. No midline shift or mass effect. Small vessel chronic ischemic changes of deep cerebral white matter. No intracranial hemorrhage, mass lesion, evidence of acute infarction, or extra-axial fluid collection. Vascular: Mild atherosclerotic calcifications bilaterally of the internal carotid arteries at the skullbase Skull: Intact.  Small nonspecific lucency in the frontal bone RIGHT of midline 6 mm diameter Other: N/A CT MAXILLOFACIAL FINDINGS Osseous: Scattered beam hardening artifacts of dental origin. Osseous mineralization normal. No fracture or bone destruction. Orbits: Intraorbital soft tissue planes clear. Sinuses: Nasal septum midline. Paranasal sinuses, mastoid air cells, and middle ear cavities clear Soft tissues: Nonspecific soft tissue calcification medial to the RIGHT mandible 14 x 7 mm image 24. Remaining regional soft tissues unremarkable. IMPRESSION: Atrophy with mild small vessel chronic ischemic changes of deep cerebral white matter. No acute intracranial abnormalities. Unremarkable CT appearance of the sinuses. Electronically Signed   By:  Ulyses SouthwardMark  Boles M.D.   On: 03/11/2017 16:06   Ct Maxillofacial Wo Contrast  Result Date: 03/11/2017 CLINICAL DATA:  Generalized muscle weakness, dizziness for 2 days, urgency urinate, chills, funny feeling in her head "like sparks", history hypertension EXAM: CT HEAD WITHOUT CONTRAST CT MAXILLOFACIAL WITHOUT CONTRAST TECHNIQUE: Multidetector CT imaging of the head and maxillofacial structures were performed using the standard protocol without intravenous contrast. Multiplanar CT image reconstructions of the maxillofacial structures were also generated. RIGHT-side of face marked with a vitamin E capsule. COMPARISON:  Sinus CT 12/19/2016 FINDINGS: CT HEAD FINDINGS Brain: Generalized atrophy. Normal ventricular morphology. No midline shift or mass effect. Small vessel chronic ischemic changes of deep cerebral white matter. No intracranial hemorrhage, mass lesion, evidence of acute infarction, or extra-axial fluid collection. Vascular: Mild atherosclerotic calcifications bilaterally of the internal carotid arteries at the skullbase Skull: Intact. Small nonspecific lucency in the frontal bone RIGHT of midline 6 mm diameter Other: N/A CT MAXILLOFACIAL FINDINGS Osseous: Scattered beam hardening  artifacts of dental origin. Osseous mineralization normal. No fracture or bone destruction. Orbits: Intraorbital soft tissue planes clear. Sinuses: Nasal septum midline. Paranasal sinuses, mastoid air cells, and middle ear cavities clear Soft tissues: Nonspecific soft tissue calcification medial to the RIGHT mandible 14 x 7 mm image 24. Remaining regional soft tissues unremarkable. IMPRESSION: Atrophy with mild small vessel chronic ischemic changes of deep cerebral white matter. No acute intracranial abnormalities. Unremarkable CT appearance of the sinuses. Electronically Signed   By: Ulyses SouthwardMark  Boles M.D.   On: 03/11/2017 16:06    Procedures Procedures (including critical care time)  Medications Ordered in ED Medications  sodium chloride 0.9 % bolus 500 mL (0 mLs Intravenous Stopped 03/11/17 1653)     Initial Impression / Assessment and Plan / ED Course  I have reviewed the triage vital signs and the nursing notes.  Pertinent labs & imaging results that were available during my care of the patient were reviewed by me and considered in my medical decision making (see chart for details).     Signed out to oncoming emergency provider pending CT head/maxillofacial.  Final Clinical Impressions(s) / ED Diagnoses   Final diagnoses:  Weakness    ED Discharge Orders    None       Loren RacerYelverton, Annalei Friesz, MD 03/12/17 1344

## 2017-03-12 ENCOUNTER — Encounter: Payer: Self-pay | Admitting: Allergy and Immunology

## 2017-05-08 ENCOUNTER — Telehealth: Payer: Self-pay | Admitting: Internal Medicine

## 2017-05-08 NOTE — Telephone Encounter (Signed)
Left msg asking pt to confirm this AWV-S appt. Last AWV 05/24/16. VDM (DD)

## 2017-05-14 NOTE — Telephone Encounter (Signed)
Patient confirmed appointment.

## 2017-06-05 ENCOUNTER — Telehealth: Payer: Self-pay

## 2017-06-05 ENCOUNTER — Ambulatory Visit (INDEPENDENT_AMBULATORY_CARE_PROVIDER_SITE_OTHER): Payer: Medicare Other

## 2017-06-05 VITALS — BP 118/70 | HR 62 | Temp 98.0°F | Ht 60.0 in | Wt 189.0 lb

## 2017-06-05 DIAGNOSIS — Z0000 Encounter for general adult medical examination without abnormal findings: Secondary | ICD-10-CM | POA: Diagnosis not present

## 2017-06-05 NOTE — Progress Notes (Signed)
Subjective:   Crystal Preston is a 77 y.o. female who presents for Medicare Annual (Subsequent) preventive examination.  Last AWV-05/24/2016       Objective:     Vitals: BP 118/70 (BP Location: Left Arm, Patient Position: Sitting)   Pulse 62   Temp 98 F (36.7 C) (Oral)   Ht 5' (1.524 m)   Wt 189 lb (85.7 kg)   SpO2 98%   BMI 36.91 kg/m   Body mass index is 36.91 kg/m.  Advanced Directives 06/05/2017 12/06/2016 11/26/2016 06/14/2016 05/24/2016 04/09/2016 02/28/2016  Does Patient Have a Medical Advance Directive? No Yes;No No No No No No  Would patient like information on creating a medical advance directive? Yes (MAU/Ambulatory/Procedural Areas - Information given) - No - Patient declined No - Patient declined No - Patient declined - No - patient declined information    Tobacco Social History   Tobacco Use  Smoking Status Never Smoker  Smokeless Tobacco Never Used     Counseling given: Not Answered   Clinical Intake:  Pre-visit preparation completed: No  Pain : No/denies pain     Nutritional Risks: None Diabetes: No  How often do you need to have someone help you when you read instructions, pamphlets, or other written materials from your doctor or pharmacy?: 1 - Never What is the last grade level you completed in school?: 11th grades  Interpreter Needed?: No  Information entered by :: Tyron Russell, rN  Past Medical History:  Diagnosis Date  . GERD (gastroesophageal reflux disease)   . HTN (hypertension)   . Hyperlipidemia   . Insomnia, unspecified   . Obesity, unspecified   . Osteoarthritis   . Other abnormality of red blood cells    Microcytosis  . Peripheral vascular disease, unspecified (HCC)   . Vertigo, benign positional    Head to left side   Past Surgical History:  Procedure Laterality Date  . ABLATION ON ENDOMETRIOSIS  1980  . CHOLECYSTECTOMY, LAPAROSCOPIC  1989  . COLONOSCOPY  2008  . COLONOSCOPY  2013  . TOTAL ABDOMINAL HYSTERECTOMY  1971     Family History  Problem Relation Age of Onset  . Cancer Father 71  . Cancer Mother 9       cervical cancer  . Glaucoma Brother    Social History   Socioeconomic History  . Marital status: Widowed    Spouse name: None  . Number of children: 2  . Years of education: None  . Highest education level: None  Social Needs  . Financial resource strain: Not hard at all  . Food insecurity - worry: Never true  . Food insecurity - inability: Never true  . Transportation needs - medical: No  . Transportation needs - non-medical: No  Occupational History  . Occupation: Stocker  Tobacco Use  . Smoking status: Never Smoker  . Smokeless tobacco: Never Used  Substance and Sexual Activity  . Alcohol use: No  . Drug use: No  . Sexual activity: None  Other Topics Concern  . None  Social History Narrative   Diet:       Do you drink/ eat things with caffeine? Yes/Coffee      Marital status:  Widowed                             What year were you married ? [id number]      Do you live in a house, apartment,assistred living, condo,  trailer, etc.)?Apt.      Is it one or more stories? 1      How many persons live in your home ? 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   Yes                           Type & how often: Bike/Row 2-5 times a week      Do you have a living will? No      Do you have a DNR form?  No                     If not, do you want to discuss one? No      Do you have signed POA?HPOA forms?  No               If so, please bring to your        appointment    Outpatient Encounter Medications as of 06/05/2017  Medication Sig  . acetaminophen (TYLENOL 8 HOUR) 650 MG CR tablet Take 650 mg by mouth every 8 (eight) hours as needed for pain.  . Acetaminophen (TYLENOL PO) Take by mouth. Tylenol Body Pain Night- acetominophen and methocarbamol  . aspirin 81 MG tablet Take 81 mg by  mouth daily.  Marland Kitchen Bioflavonoid Products (ESTER C PO) Take 1 tablet (1000 mg ) by mouth once a day  . CALCIUM CITRATE PO Take 1 tablet (1000 mg ) by mouth daily  . Cholecalciferol 4000 units TABS Take 1 tablet by mouth daily  . Cod Liver Oil CAPS Take 1 tablet by mouth daily  . ferrous sulfate 325 (65 FE) MG EC tablet Take 325 mg by mouth daily with breakfast.  . fluticasone (FLONASE) 50 MCG/ACT nasal spray SPRAY 2 SPRAYS INTO EACH NOSTRIL EVERY DAY  . ibandronate (BONIVA) 150 MG tablet TAKE 1 EVERY 30 DAYS IN THE AM WITH FULL GLASS OF WATER ON EMPTY STOMACH. DO NOT LIE DOWN X30 MINUTE  . losartan-hydrochlorothiazide (HYZAAR) 100-12.5 MG tablet Take 1 tablet by mouth daily.  . montelukast (SINGULAIR) 10 MG tablet Take 1 tablet (10 mg total) by mouth at bedtime.  . Naphazoline HCl (CLEAR EYES OP) Apply to eye as needed.   . pantoprazole (PROTONIX) 40 MG tablet TAKE ONE TABLET BY MOUTH ONCE DAILY  . potassium chloride SA (K-DUR,KLOR-CON) 20 MEQ tablet Take 1 tablet (20 mEq total) by mouth daily.  . pravastatin (PRAVACHOL) 40 MG tablet Take 1 tablet (40 mg total) by mouth daily.   No facility-administered encounter medications on file as of 06/05/2017.     Activities of Daily Living In your present state of health, do you have any difficulty performing the following activities: 06/05/2017  Hearing? N  Vision? N  Difficulty concentrating or making decisions? N  Walking or climbing stairs? N  Dressing or bathing? N  Doing errands, shopping? N  Preparing Food and eating ? N  Using the Toilet? N  In the past six months, have you accidently leaked urine? Y  Comment stress incontinence  Do you have problems with loss of bowel control? N  Managing your Medications? N  Managing your Finances? N  Housekeeping or managing your Housekeeping? N  Some recent data might be hidden    Patient Care  Team: Kirt Boysarter, Monica, DO as PCP - General (Internal Medicine) Elise BenneGould, Sigmund, MD as Consulting Physician  (Ophthalmology)    Assessment:   This is a routine wellness examination for Crystal Preston.  Exercise Activities and Dietary recommendations Current Exercise Habits: Home exercise routine, Type of exercise: treadmill;strength training/weights, Time (Minutes): 60, Frequency (Times/Week): 4, Weekly Exercise (Minutes/Week): 240, Intensity: Mild, Exercise limited by: None identified  Goals    None      Fall Risk Fall Risk  06/05/2017 11/26/2016 05/24/2016 04/09/2016 03/12/2016  Falls in the past year? No No No No No   Is the patient's home free of loose throw rugs in walkways, pet beds, electrical cords, etc?   yes      Grab bars in the bathroom? yes      Handrails on the stairs?   yes      Adequate lighting?   yes  Timed Get Up and Go performed: 18 seconds, fall risk  Depression Screen PHQ 2/9 Scores 06/05/2017 11/26/2016 05/24/2016 02/28/2016  PHQ - 2 Score 0 0 0 0     Cognitive Function MMSE - Mini Mental State Exam 05/24/2016  Orientation to time 5  Orientation to Place 5  Registration 3  Attention/ Calculation 5  Recall 3  Language- name 2 objects 2  Language- repeat 1  Language- follow 3 step command 2  Language- read & follow direction 1  Write a sentence 1  Copy design 0  Total score 28        Immunization History  Administered Date(s) Administered  . Influenza,inj,Quad PF,6+ Mos 02/28/2016  . Pneumococcal Conjugate-13 05/24/2016    Qualifies for Shingles Vaccine?yes, educated and declined  Screening Tests Health Maintenance  Topic Date Due  . INFLUENZA VACCINE  11/20/2016  . PNA vac Low Risk Adult (2 of 2 - PPSV23) 05/24/2017  . TETANUS/TDAP  05/23/2018 (Originally 11/02/1959)  . DEXA SCAN  Completed    Cancer Screenings: Lung: Low Dose CT Chest recommended if Age 60-80 years, 30 pack-year currently smoking OR have quit w/in 15years. Patient does not qualify. Breast:  Up to date on Mammogram? Yes   Up to date of Bone Density/Dexa? Yes Colorectal: up to  date  Additional Screenings:  Hepatitis B/HIV/Syphillis: not indicated Hepatitis C Screening: declined     Plan:    I have personally reviewed and addressed the Medicare Annual Wellness questionnaire and have noted the following in the patient's chart:  A. Medical and social history B. Use of alcohol, tobacco or illicit drugs  C. Current medications and supplements D. Functional ability and status E.  Nutritional status F.  Physical activity G. Advance directives H. List of other physicians I.  Hospitalizations, surgeries, and ER visits in previous 12 months J.  Vitals K. Screenings to include hearing, vision, cognitive, depression L. Referrals and appointments - none  In addition, I have reviewed and discussed with patient certain preventive protocols, quality metrics, and best practice recommendations. A written personalized care plan for preventive services as well as general preventive health recommendations were provided to patient.  See attached scanned questionnaire for additional information.   Signed,   Tyron RussellSara Saunders, RN Nurse Health Advisor   Quick Notes   Health Maintenance: TDAP, shingrix, flu vaccine, pna 23 due and pt declined.     Abnormal Screen: MMSE 27/30, Did not pass clock drawing     Patient Concerns: C/o itching of buttock and requested prescription- PCP notified     Nurse Concerns: None

## 2017-06-05 NOTE — Telephone Encounter (Signed)
Called to inform pt that she will need to be seen by doctor and evaluated before the rx she requested, Nystatin and Triamcinolone Acetonide Cream, can be ordered. No answer, left VM

## 2017-06-05 NOTE — Patient Instructions (Signed)
[name] , Thank you for taking time to come for your Medicare Wellness Visit. I appreciate your ongoing commitment to your health goals. Please review the following plan we discussed and let me know if I can assist you in the future.   Screening recommendations/referrals: Colonoscopy excluded, over age 78 Mammogram up to date, due 04/09/2018 Bone Density up to date Recommended yearly ophthalmology/optometry visit for glaucoma screening and checkup Recommended yearly dental visit for hygiene and checkup  Vaccinations: Influenza vaccine due, declined Pneumococcal vaccine due, declined Tdap vaccine due, declined Shingles vaccine due, declined    Advanced directives: Advance directive discussed with you today. I have provided a copy for you to complete at home and have notarized. Once this is complete please bring a copy in to our office so we can scan it into your chart.  Conditions/risks identified: none  Next appointment: Tyron RussellSara Saunders, RN 06/12/2018 @ 1:45pm   Preventive Care 65 Years and Older, Female Preventive care refers to lifestyle choices and visits with your health care provider that can promote health and wellness. What does preventive care include?  A yearly physical exam. This is also called an annual well check.  Dental exams once or twice a year.  Routine eye exams. Ask your health care provider how often you should have your eyes checked.  Personal lifestyle choices, including:  Daily care of your teeth and gums.  Regular physical activity.  Eating a healthy diet.  Avoiding tobacco and drug use.  Limiting alcohol use.  Practicing safe sex.  Taking low-dose aspirin every day.  Taking vitamin and mineral supplements as recommended by your health care provider. What happens during an annual well check? The services and screenings done by your health care provider during your annual well check will depend on your age, overall health, lifestyle risk  factors, and family history of disease. Counseling  Your health care provider may ask you questions about your:  Alcohol use.  Tobacco use.  Drug use.  Emotional well-being.  Home and relationship well-being.  Sexual activity.  Eating habits.  History of falls.  Memory and ability to understand (cognition).  Work and work Astronomerenvironment.  Reproductive health. Screening  You may have the following tests or measurements:  Height, weight, and BMI.  Blood pressure.  Lipid and cholesterol levels. These may be checked every 5 years, or more frequently if you are over 727 years old.  Skin check.  Lung cancer screening. You may have this screening every year starting at age 78 if you have a 30-pack-year history of smoking and currently smoke or have quit within the past 15 years.  Fecal occult blood test (FOBT) of the stool. You may have this test every year starting at age 450.  Flexible sigmoidoscopy or colonoscopy. You may have a sigmoidoscopy every 5 years or a colonoscopy every 10 years starting at age 78.  Hepatitis C blood test.  Hepatitis B blood test.  Sexually transmitted disease (STD) testing.  Diabetes screening. This is done by checking your blood sugar (glucose) after you have not eaten for a while (fasting). You may have this done every 1-3 years.  Bone density scan. This is done to screen for osteoporosis. You may have this done starting at age 78.  Mammogram. This may be done every 1-2 years. Talk to your health care provider about how often you should have regular mammograms. Talk with your health care provider about your test results, treatment options, and if necessary, the need for  more tests. Vaccines  Your health care provider may recommend certain vaccines, such as:  Influenza vaccine. This is recommended every year.  Tetanus, diphtheria, and acellular pertussis (Tdap, Td) vaccine. You may need a Td booster every 10 years.  Zoster vaccine. You  may need this after age 36.  Pneumococcal 13-valent conjugate (PCV13) vaccine. One dose is recommended after age 16.  Pneumococcal polysaccharide (PPSV23) vaccine. One dose is recommended after age 64. Talk to your health care provider about which screenings and vaccines you need and how often you need them. This information is not intended to replace advice given to you by your health care provider. Make sure you discuss any questions you have with your health care provider. Document Released: 05/05/2015 Document Revised: 12/27/2015 Document Reviewed: 02/07/2015 Elsevier Interactive Patient Education  2017 [place] Prevention in the Home Falls can cause injuries. They can happen to people of all ages. There are many things you can do to make your home safe and to help prevent falls. What can I do on the outside of my home?  Regularly fix the edges of walkways and driveways and fix any cracks.  Remove anything that might make you trip as you walk through a door, such as a raised step or threshold.  Trim any bushes or trees on the path to your home.  Use bright outdoor lighting.  Clear any walking paths of anything that might make someone trip, such as rocks or tools.  Regularly check to see if handrails are loose or broken. Make sure that both sides of any steps have handrails.  Any raised decks and porches should have guardrails on the edges.  Have any leaves, snow, or ice cleared regularly.  Use sand or salt on walking paths during winter.  Clean up any spills in your garage right away. This includes oil or grease spills. What can I do in the bathroom?  Use night lights.  Install grab bars by the toilet and in the tub and shower. Do not use towel bars as grab bars.  Use non-skid mats or decals in the tub or shower.  If you need to sit down in the shower, use a plastic, non-slip stool.  Keep the floor dry. Clean up any water that spills on the floor as soon as  it happens.  Remove soap buildup in the tub or shower regularly.  Attach bath mats securely with double-sided non-slip rug tape.  Do not have throw rugs and other things on the floor that can make you trip. What can I do in the bedroom?  Use night lights.  Make sure that you have a light by your bed that is easy to reach.  Do not use any sheets or blankets that are too big for your bed. They should not hang down onto the floor.  Have a firm chair that has side arms. You can use this for support while you get dressed.  Do not have throw rugs and other things on the floor that can make you trip. What can I do in the kitchen?  Clean up any spills right away.  Avoid walking on wet floors.  Keep items that you use a lot in easy-to-reach places.  If you need to reach something above you, use a strong step stool that has a grab bar.  Keep electrical cords out of the way.  Do not use floor polish or wax that makes floors slippery. If you must use wax, use non-skid  floor wax.  Do not have throw rugs and other things on the floor that can make you trip. What can I do with my stairs?  Do not leave any items on the stairs.  Make sure that there are handrails on both sides of the stairs and use them. Fix handrails that are broken or loose. Make sure that handrails are as long as the stairways.  Check any carpeting to make sure that it is firmly attached to the stairs. Fix any carpet that is loose or worn.  Avoid having throw rugs at the top or bottom of the stairs. If you do have throw rugs, attach them to the floor with carpet tape.  Make sure that you have a light switch at the top of the stairs and the bottom of the stairs. If you do not have them, ask someone to add them for you. What else can I do to help prevent falls?  Wear shoes that:  Do not have high heels.  Have rubber bottoms.  Are comfortable and fit you well.  Are closed at the toe. Do not wear sandals.  If  you use a stepladder:  Make sure that it is fully opened. Do not climb a closed stepladder.  Make sure that both sides of the stepladder are locked into place.  Ask someone to hold it for you, if possible.  Clearly mark and make sure that you can see:  Any grab bars or handrails.  First and last steps.  Where the edge of each step is.  Use tools that help you move around (mobility aids) if they are needed. These include:  Canes.  Walkers.  Scooters.  Crutches.  Turn on the lights when you go into a dark area. Replace any light bulbs as soon as they burn out.  Set up your furniture so you have a clear path. Avoid moving your furniture around.  If any of your floors are uneven, fix them.  If there are any pets around you, be aware of where they are.  Review your medicines with your doctor. Some medicines can make you feel dizzy. This can increase your chance of falling. Ask your doctor what other things that you can do to help prevent falls. This information is not intended to replace advice given to you by your health care provider. Make sure you discuss any questions you have with your health care provider. Document Released: 02/02/2009 Document Revised: 09/14/2015 Document Reviewed: 05/13/2014 Elsevier Interactive Patient Education  2017 [employer].

## 2017-06-30 ENCOUNTER — Other Ambulatory Visit: Payer: Self-pay | Admitting: Internal Medicine

## 2017-07-31 DIAGNOSIS — M1711 Unilateral primary osteoarthritis, right knee: Secondary | ICD-10-CM | POA: Diagnosis not present

## 2017-07-31 DIAGNOSIS — M1712 Unilateral primary osteoarthritis, left knee: Secondary | ICD-10-CM | POA: Diagnosis not present

## 2017-08-01 ENCOUNTER — Other Ambulatory Visit: Payer: Self-pay | Admitting: Internal Medicine

## 2017-08-10 ENCOUNTER — Other Ambulatory Visit: Payer: Self-pay | Admitting: Internal Medicine

## 2017-08-22 ENCOUNTER — Telehealth: Payer: Self-pay | Admitting: *Deleted

## 2017-08-22 NOTE — Telephone Encounter (Signed)
Patient called and left message on clinical intake stating that she had another UTI and needs antibiotic called to her pharmacy.   I reviewed chart and we have not seen patient since 11/26/2016. Patient canceled last appointment. Patient needs appointment to be evaluated.   I called patient back and had to [facility] to return call. Awaiting RC.

## 2017-08-22 NOTE — Telephone Encounter (Signed)
Noted  

## 2017-08-25 NOTE — Telephone Encounter (Signed)
Left message on voicemail for patient to return call when available, reason for call: patient did not return call from Friday 08/21/17, status update, and offer appointment

## 2017-08-28 DIAGNOSIS — M25461 Effusion, right knee: Secondary | ICD-10-CM | POA: Diagnosis not present

## 2017-08-28 DIAGNOSIS — M25462 Effusion, left knee: Secondary | ICD-10-CM | POA: Diagnosis not present

## 2017-09-08 DIAGNOSIS — H25013 Cortical age-related cataract, bilateral: Secondary | ICD-10-CM | POA: Diagnosis not present

## 2017-10-20 ENCOUNTER — Other Ambulatory Visit: Payer: Self-pay | Admitting: Internal Medicine

## 2017-11-18 ENCOUNTER — Other Ambulatory Visit: Payer: Self-pay | Admitting: Internal Medicine

## 2017-11-18 NOTE — Telephone Encounter (Signed)
A medication refill was received from pharmacy for losartan-hctz 100-12.5 mg. Rx was pended to provider for approval due to patient having not been seen since 11/2016 and she has no upcoming appointment pending.

## 2017-11-20 ENCOUNTER — Telehealth: Payer: Self-pay | Admitting: *Deleted

## 2017-11-20 NOTE — Telephone Encounter (Signed)
She needs to be seen in the office before a refill is granted

## 2017-11-20 NOTE — Telephone Encounter (Signed)
Patient called and left message on Clinical intake stating that she needs a refill on her Losartan.   Last refill she was notified that she needed an appointment before any further refills.   I called patient and had to leave message to return call. Patient needs to schedule an  Appointment to be seen. Last appointment was 11/26/2016.

## 2017-11-22 ENCOUNTER — Other Ambulatory Visit: Payer: Self-pay | Admitting: Internal Medicine

## 2017-11-27 DIAGNOSIS — M545 Low back pain: Secondary | ICD-10-CM | POA: Diagnosis not present

## 2017-12-04 ENCOUNTER — Ambulatory Visit: Payer: Medicare Other | Admitting: Family

## 2017-12-04 ENCOUNTER — Encounter: Payer: Self-pay | Admitting: Internal Medicine

## 2017-12-04 ENCOUNTER — Ambulatory Visit (INDEPENDENT_AMBULATORY_CARE_PROVIDER_SITE_OTHER): Payer: Medicare Other | Admitting: Internal Medicine

## 2017-12-04 VITALS — BP 160/80 | HR 71 | Temp 98.7°F | Ht 60.0 in | Wt 194.0 lb

## 2017-12-04 DIAGNOSIS — M810 Age-related osteoporosis without current pathological fracture: Secondary | ICD-10-CM

## 2017-12-04 DIAGNOSIS — M150 Primary generalized (osteo)arthritis: Secondary | ICD-10-CM

## 2017-12-04 DIAGNOSIS — M159 Polyosteoarthritis, unspecified: Secondary | ICD-10-CM

## 2017-12-04 DIAGNOSIS — M8949 Other hypertrophic osteoarthropathy, multiple sites: Secondary | ICD-10-CM

## 2017-12-04 DIAGNOSIS — I10 Essential (primary) hypertension: Secondary | ICD-10-CM

## 2017-12-04 DIAGNOSIS — H9311 Tinnitus, right ear: Secondary | ICD-10-CM

## 2017-12-04 DIAGNOSIS — E785 Hyperlipidemia, unspecified: Secondary | ICD-10-CM

## 2017-12-04 DIAGNOSIS — B372 Candidiasis of skin and nail: Secondary | ICD-10-CM

## 2017-12-04 MED ORDER — NYSTATIN 100000 UNIT/GM EX CREA: 1.0000 "application " | TOPICAL_CREAM | Freq: Every day | CUTANEOUS | 1 refills | Status: DC | PRN

## 2017-12-04 NOTE — Progress Notes (Signed)
[facility] Provider:  Quintara Bost L. Renato Gailseed, D.O., C.M.D.  Code Status: full code Goals of Care:  Advanced Directives 06/05/2017  Does Patient Have a Medical Advance Directive? No  Would patient like information on creating a medical advance directive? Yes (MAU/Ambulatory/Procedural Areas - Information given)     Chief Complaint  Patient presents with  . Medical Management of Chronic Issues    discuss medications   HPI: Patient is a 77 y.o. female seen today for medical management of chronic diseases.  She's Dr. Celene Skeenarter's patient.  She needs her meds refilled so came in to be seen.    BP is high.  She is out of medication probably for 2 weeks.  Losartan/hctz was refilled today.  She does take her baby asa.    Denies concerns.  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 He gave her 800mg  ibuprofen.  It's helped her.  Goes off and on and gets injection in her knees.  Goes to the gym 5 days a week, sometimes 6.    Has had iron deficiency anemia:  Take iron supplement.    No vertigo.  Does keep some ringing in her ears.   Is seeing a third doctor about the ringing. Sometimes like a little ding ding.  She's good when there's noise, but makes her want to scream when she goes to bed when it's quiet.  She's going to the audiologist.  Reports the right ear pops and was opening and closing her jaw.  If pops, she can't hear well and starts talking loud.    Has osteoporosis:  Takes boniva and her ca with D and D3 for osteoporosis.  Last bone density was 12/17.  Due again 12/19.    No recent labs.    Had a severe sinus infection and needed CT scan.  Had ulcers and her tongue was "slick like a piece of liver".  Could only eat soft foods until march or April.    CT head showed atrophy with mild chronic ischemic changes of her deep white cerebral matter.  The CT in Nov was normal.  Past Medical History:  Diagnosis Date  . GERD (gastroesophageal reflux disease)     . HTN (hypertension)   . Hyperlipidemia   . Insomnia, unspecified   . Obesity, unspecified   . Osteoarthritis   . Other abnormality of red blood cells    Microcytosis  . Peripheral vascular disease, unspecified (HCC)   . Vertigo, benign positional    Head to left side    Past Surgical History:  Procedure Laterality Date  . ABLATION ON ENDOMETRIOSIS  1980  . CHOLECYSTECTOMY, LAPAROSCOPIC  1989  . COLONOSCOPY  2008  . COLONOSCOPY  2013  . TOTAL ABDOMINAL HYSTERECTOMY  1971    Allergies  Allergen Reactions  . Azithromycin Other (See Comments)    headache, excessive sleepiness  . Naproxen   . Nitrofuran Derivatives Other (See Comments)    Knots on body  . Nitrofurantoin   . Propoxyphene     Darvocet    Outpatient Encounter Medications as of 12/04/2017  Medication Sig  . aspirin 81 MG tablet Take 81 mg by mouth daily.  Marland Kitchen. Bioflavonoid Products (ESTER C PO) Take 1 tablet (1000 mg ) by mouth once a day  . CALCIUM CITRATE PO Take 1 tablet (1000 mg ) by mouth daily  . Cholecalciferol 4000 units TABS Take 1 tablet by mouth daily  . Cod Liver  Oil CAPS Take 1 tablet by mouth daily  . ferrous sulfate 325 (65 FE) MG EC tablet Take 325 mg by mouth daily with breakfast.  . ibandronate (BONIVA) 150 MG tablet TAKE 1 EVERY 30 DAYS IN THE AM WITH FULL GLASS OF WATER ON EMPTY STOMACH. DO NOT LIE DOWN X30 MINUTE  . ibuprofen (ADVIL,MOTRIN) 800 MG tablet Take 800 mg by mouth 2 (two) times daily.  Marland Kitchen. losartan-hydrochlorothiazide (HYZAAR) 100-12.5 MG tablet Take 1 tablet by mouth daily. Must have follow up for further refills.  . Naphazoline HCl (CLEAR EYES OP) Apply to eye as needed.   . pantoprazole (PROTONIX) 40 MG tablet TAKE ONE TABLET BY MOUTH ONCE DAILY  . pravastatin (PRAVACHOL) 40 MG tablet TAKE 1 TABLET BY MOUTH EVERY DAY  . [DISCONTINUED] acetaminophen (TYLENOL 8 HOUR) 650 MG CR tablet Take 650 mg by mouth every 8 (eight) hours as needed for pain.  . [DISCONTINUED] Acetaminophen  (TYLENOL PO) Take by mouth. Tylenol Body Pain Night- acetominophen and methocarbamol  . [DISCONTINUED] fluticasone (FLONASE) 50 MCG/ACT nasal spray SPRAY 2 SPRAYS INTO EACH NOSTRIL EVERY DAY (Patient not taking: Reported on 06/05/2017)  . [DISCONTINUED] KLOR-CON M20 20 MEQ tablet TAKE 1 TABLET BY MOUTH EVERY DAY  . [DISCONTINUED] losartan-hydrochlorothiazide (HYZAAR) 100-12.5 MG tablet Take 1 tablet by mouth daily.  . [DISCONTINUED] montelukast (SINGULAIR) 10 MG tablet Take 1 tablet (10 mg total) by mouth at bedtime.   No facility-administered encounter medications on file as of 12/04/2017.     Review of Systems:  Review of Systems  Constitutional: Negative for chills, fever and malaise/fatigue.  HENT: Positive for tinnitus. Negative for congestion, ear pain and sore throat.   Eyes: Negative for blurred vision.       Glasses  Respiratory: Negative for cough and shortness of breath.   Cardiovascular: Negative for chest pain, palpitations and leg swelling.  Gastrointestinal: Negative for abdominal pain.  Genitourinary: Negative for dysuria.       Reports urine smells like dirty swamp water  Musculoskeletal: Positive for joint pain. Negative for falls.  Skin: Negative for itching and rash.  Neurological: Negative for dizziness and loss of consciousness.  Endo/Heme/Allergies: Does not bruise/bleed easily.    Health Maintenance  Topic Date Due  . PNA vac Low Risk Adult (2 of 2 - PPSV23) 05/24/2017  . INFLUENZA VACCINE  11/20/2017  . TETANUS/TDAP  05/23/2018 (Originally 11/02/1959)  . DEXA SCAN  Completed    Physical Exam: Vitals:   12/04/17 1352  BP: (!) 160/80  Pulse: 71  Temp: 98.7 F (37.1 C)  TempSrc: Oral  SpO2: 96%  Weight: 194 lb (88 kg)  Height: 5' (1.524 m)   Body mass index is 37.89 kg/m. Physical Exam  Constitutional: She is oriented to person, place, and time. She appears well-developed and well-nourished. No distress.  HENT:  Head: Normocephalic and atraumatic.   Eyes:  glasses  Cardiovascular: Normal rate, regular rhythm, normal heart sounds and intact distal pulses.  Pulmonary/Chest: Effort normal and breath sounds normal. No respiratory distress.  Abdominal: Bowel sounds are normal.  Musculoskeletal: Normal range of motion.  Neurological: She is alert and oriented to person, place, and time.  Skin: Skin is warm and dry.  Erythema at top of gluteal crease  Psychiatric: She has a normal mood and affect.    Labs reviewed: Basic Metabolic Panel: Recent Labs    03/11/17 1351  NA 135  K 3.5  CL 101  CO2 25  GLUCOSE 106*  BUN 10  CREATININE 0.71  CALCIUM 9.2   Liver Function Tests: Recent Labs    03/11/17 1351  AST 24  ALT 21  ALKPHOS 57  BILITOT 0.4  PROT 7.2  ALBUMIN 3.9   No results for input(s): LIPASE, AMYLASE in the last 8760 hours. No results for input(s): AMMONIA in the last 8760 hours. CBC: Recent Labs    03/11/17 1351  WBC 16.8*  NEUTROABS 12.9*  HGB 11.1*  HCT 34.0*  MCV 70.7*  PLT 319   Assessment/Plan 1. Hyperlipidemia, unspecified hyperlipidemia type - no recent check and pt does not come regularly so will check nonfasting: - Lipid panel F/u labs before next routine visit ideally fasting: - COMPLETE METABOLIC PANEL WITH GFR; Future - Lipid panel; Future  2. Primary osteoarthritis involving multiple joints -cont exercise, is on ibuprofen now from ortho  3. Essential hypertension -bp elevated b/c she's been out of medicine several days; educated that we like to see her twice a year and get labs due to medications--agrees with this  - CBC with Differential/Platelet - COMPLETE METABOLIC PANEL WITH GFR  - CBC with Differential/Platelet; Future - COMPLETE METABOLIC PANEL WITH GFR; Future  4. Tinnitus of right ear -ongoing issue for years, is returning to audiology  5. Age related osteoporosis, unspecified pathological fracture presence -cont boniva, ca with D and additional D3, exercise at the gym  5 days per week  6. Candidal skin infection -of gluteal crease, tx with nysatin cream which worked before - nystatin cream (MYCOSTATIN); Apply 1 application topically daily as needed (gluteal crease rash).  Dispense: 30 g; Refill: 1  Labs/tests ordered:   Orders Placed This Encounter  Procedures  . CBC with Differential/Platelet  . COMPLETE METABOLIC PANEL WITH GFR  . Lipid panel    Order Specific Question:   Has the patient fasted?    Answer:   No  . CBC with Differential/Platelet    Standing Status:   Future    Standing Expiration Date:   12/05/2018  . COMPLETE METABOLIC PANEL WITH GFR    Standing Status:   Future    Standing Expiration Date:   12/05/2018  . Lipid panel    Standing Status:   Future    Standing Expiration Date:   12/05/2018   Next appt:  6 mos med mgt, fasting labs before   Jackee Glasner L. Mohit Zirbes, D.O. Geriatrics [facility] [address] [place], [place] [address] Cell Phone (Mon-Fri 8am-5pm):  [phone] On Call:  [phone] & follow prompts after 5pm & weekends Office Phone:  [phone] Office Fax:  [phone]

## 2017-12-05 ENCOUNTER — Other Ambulatory Visit: Payer: Self-pay | Admitting: *Deleted

## 2017-12-05 LAB — COMPLETE METABOLIC PANEL WITH GFR
AG Ratio: 1.5 (calc) (ref 1.0–2.5)
ALT: 14 U/L (ref 6–29)
AST: 20 U/L (ref 10–35)
Albumin: 4.2 g/dL (ref 3.6–5.1)
Alkaline phosphatase (APISO): 51 U/L (ref 33–130)
BUN: 13 mg/dL (ref 7–25)
CO2: 27 mmol/L (ref 20–32)
Calcium: 9.4 mg/dL (ref 8.6–10.4)
Chloride: 105 mmol/L (ref 98–110)
Creat: 0.64 mg/dL (ref 0.60–0.93)
GFR, Est African American: 99 mL/min/{1.73_m2} (ref >=60)
GFR, Est Non African American: 85 mL/min/{1.73_m2} (ref >=60)
Globulin: 2.8 g/dL (calc) (ref 1.9–3.7)
Glucose, Bld: 98 mg/dL (ref 65–139)
Potassium: 4 mmol/L (ref 3.5–5.3)
Sodium: 139 mmol/L (ref 135–146)
Total Bilirubin: 0.2 mg/dL (ref 0.2–1.2)
Total Protein: 7 g/dL (ref 6.1–8.1)

## 2017-12-05 LAB — CBC WITH DIFFERENTIAL/PLATELET
Basophils Absolute: 22 cells/uL (ref 0–200)
Basophils Relative: 0.2 %
Eosinophils Absolute: 196 cells/uL (ref 15–500)
Eosinophils Relative: 1.8 %
HCT: 36.3 % (ref 35.0–45.0)
Hemoglobin: 11.5 g/dL — ABNORMAL LOW (ref 11.7–15.5)
Lymphs Abs: 2376 cells/uL (ref 850–3900)
MCH: 23.2 pg — ABNORMAL LOW (ref 27.0–33.0)
MCHC: 31.7 g/dL — ABNORMAL LOW (ref 32.0–36.0)
MCV: 73.2 fL — ABNORMAL LOW (ref 80.0–100.0)
MPV: 10 fL (ref 7.5–12.5)
Monocytes Relative: 7.2 %
Neutro Abs: 7521 cells/uL (ref 1500–7800)
Neutrophils Relative %: 69 %
Platelets: 363 10*3/uL (ref 140–400)
RBC: 4.96 10*6/uL (ref 3.80–5.10)
RDW: 14.3 % (ref 11.0–15.0)
Total Lymphocyte: 21.8 %
WBC mixed population: 785 cells/uL (ref 200–950)
WBC: 10.9 10*3/uL — ABNORMAL HIGH (ref 3.8–10.8)

## 2017-12-05 LAB — LIPID PANEL
Cholesterol: 193 mg/dL (ref <200)
HDL: 50 mg/dL — ABNORMAL LOW (ref >50)
LDL Cholesterol (Calc): 104 mg/dL (calc) — ABNORMAL HIGH
Non-HDL Cholesterol (Calc): 143 mg/dL (calc) — ABNORMAL HIGH (ref <130)
Total CHOL/HDL Ratio: 3.9 (calc) (ref <5.0)
Triglycerides: 273 mg/dL — ABNORMAL HIGH (ref <150)

## 2017-12-05 MED ORDER — LOSARTAN POTASSIUM-HCTZ 100-12.5 MG PO TABS: 1.0000 | ORAL_TABLET | Freq: Every day | ORAL | 0 refills | Status: DC

## 2017-12-10 ENCOUNTER — Encounter: Payer: Self-pay | Admitting: Internal Medicine

## 2018-01-23 ENCOUNTER — Other Ambulatory Visit: Payer: Self-pay | Admitting: Internal Medicine

## 2018-01-23 DIAGNOSIS — M810 Age-related osteoporosis without current pathological fracture: Secondary | ICD-10-CM

## 2018-02-01 ENCOUNTER — Other Ambulatory Visit: Payer: Self-pay | Admitting: Internal Medicine

## 2018-02-27 ENCOUNTER — Other Ambulatory Visit: Payer: Self-pay | Admitting: Internal Medicine

## 2018-03-09 ENCOUNTER — Other Ambulatory Visit: Payer: Self-pay

## 2018-03-09 NOTE — Telephone Encounter (Signed)
Patient called requesting a refill on pravastatin. I informed patient according to our records she should [facility] at [facility]. I instructed patient to call [facility] and request that they get rx ready for her.  Patient will do so and call back if needed.

## 2018-04-02 DIAGNOSIS — M545 Low back pain: Secondary | ICD-10-CM | POA: Diagnosis not present

## 2018-04-14 ENCOUNTER — Other Ambulatory Visit: Payer: Self-pay | Admitting: *Deleted

## 2018-04-14 MED ORDER — PANTOPRAZOLE SODIUM 40 MG PO TBEC: 40.0000 mg | DELAYED_RELEASE_TABLET | Freq: Every day | ORAL | 1 refills | Status: DC

## 2018-04-14 NOTE — Telephone Encounter (Signed)
[facility] [address] 

## 2018-05-22 ENCOUNTER — Other Ambulatory Visit: Payer: Self-pay | Admitting: Nurse Practitioner

## 2018-06-01 ENCOUNTER — Other Ambulatory Visit: Payer: Medicare Other

## 2018-06-01 DIAGNOSIS — E785 Hyperlipidemia, unspecified: Secondary | ICD-10-CM | POA: Diagnosis not present

## 2018-06-01 DIAGNOSIS — I10 Essential (primary) hypertension: Secondary | ICD-10-CM | POA: Diagnosis not present

## 2018-06-02 LAB — LIPID PANEL
Cholesterol: 188 mg/dL (ref <200)
HDL: 55 mg/dL (ref >=50)
LDL Cholesterol (Calc): 107 mg/dL (calc) — ABNORMAL HIGH
Non-HDL Cholesterol (Calc): 133 mg/dL (calc) — ABNORMAL HIGH (ref <130)
Total CHOL/HDL Ratio: 3.4 (calc) (ref <5.0)
Triglycerides: 149 mg/dL (ref <150)

## 2018-06-02 LAB — COMPLETE METABOLIC PANEL WITH GFR
AG Ratio: 1.5 (calc) (ref 1.0–2.5)
ALT: 15 U/L (ref 6–29)
AST: 19 U/L (ref 10–35)
Albumin: 4.5 g/dL (ref 3.6–5.1)
Alkaline phosphatase (APISO): 56 U/L (ref 37–153)
BUN: 12 mg/dL (ref 7–25)
CO2: 28 mmol/L (ref 20–32)
Calcium: 10.2 mg/dL (ref 8.6–10.4)
Chloride: 102 mmol/L (ref 98–110)
Creat: 0.72 mg/dL (ref 0.60–0.93)
GFR, Est African American: 93 mL/min/{1.73_m2} (ref >=60)
GFR, Est Non African American: 80 mL/min/{1.73_m2} (ref >=60)
Globulin: 3 g/dL (calc) (ref 1.9–3.7)
Glucose, Bld: 97 mg/dL (ref 65–139)
Potassium: 4.3 mmol/L (ref 3.5–5.3)
Sodium: 140 mmol/L (ref 135–146)
Total Bilirubin: 0.3 mg/dL (ref 0.2–1.2)
Total Protein: 7.5 g/dL (ref 6.1–8.1)

## 2018-06-02 LAB — CBC WITH DIFFERENTIAL/PLATELET
Absolute Monocytes: 605 cells/uL (ref 200–950)
Basophils Absolute: 45 cells/uL (ref 0–200)
Basophils Relative: 0.4 %
Eosinophils Absolute: 258 cells/uL (ref 15–500)
Eosinophils Relative: 2.3 %
HCT: 39.9 % (ref 35.0–45.0)
Hemoglobin: 12.4 g/dL (ref 11.7–15.5)
Lymphs Abs: 2027 cells/uL (ref 850–3900)
MCH: 22.5 pg — ABNORMAL LOW (ref 27.0–33.0)
MCHC: 31.1 g/dL — ABNORMAL LOW (ref 32.0–36.0)
MCV: 72.4 fL — ABNORMAL LOW (ref 80.0–100.0)
MPV: 9.8 fL (ref 7.5–12.5)
Monocytes Relative: 5.4 %
Neutro Abs: 8266 cells/uL — ABNORMAL HIGH (ref 1500–7800)
Neutrophils Relative %: 73.8 %
Platelets: 374 10*3/uL (ref 140–400)
RBC: 5.51 10*6/uL — ABNORMAL HIGH (ref 3.80–5.10)
RDW: 13.7 % (ref 11.0–15.0)
Total Lymphocyte: 18.1 %
WBC: 11.2 10*3/uL — ABNORMAL HIGH (ref 3.8–10.8)

## 2018-06-04 ENCOUNTER — Encounter: Payer: Self-pay | Admitting: *Deleted

## 2018-06-08 ENCOUNTER — Ambulatory Visit: Payer: Medicare Other | Admitting: Internal Medicine

## 2018-06-08 ENCOUNTER — Ambulatory Visit: Payer: Medicare Other | Admitting: Family

## 2018-06-12 ENCOUNTER — Ambulatory Visit: Payer: Self-pay

## 2018-06-12 ENCOUNTER — Encounter: Payer: Self-pay | Admitting: Family

## 2018-06-12 ENCOUNTER — Ambulatory Visit (INDEPENDENT_AMBULATORY_CARE_PROVIDER_SITE_OTHER): Payer: Medicare Other | Admitting: Family

## 2018-06-12 VITALS — BP 140/82 | HR 66 | Temp 98.2°F | Ht 60.0 in | Wt 192.6 lb

## 2018-06-12 DIAGNOSIS — Z0000 Encounter for general adult medical examination without abnormal findings: Secondary | ICD-10-CM | POA: Diagnosis not present

## 2018-06-12 NOTE — Patient Instructions (Signed)
Crystal Preston , Thank you for taking time to come for your Medicare Wellness Visit. I appreciate your ongoing commitment to your health goals. Please review the following plan we discussed and let me know if I can assist you in the future.   Screening recommendations/referrals: Colonoscopy; Declined  Mammogram; Declined  Bone Density: Up to date  Recommended yearly ophthalmology/optometry visit for glaucoma screening and checkup Recommended yearly dental visit for hygiene and checkup  Vaccinations: Influenza vaccine: Declined  Pneumococcal vaccine: up to date  Tdap vaccine: Due 06/13/2019  Shingles vaccine: declined    Advanced directives: declined   Conditions/risks identified: Advance age,Hypertension and Obesity   Next appointment: 1 year    Preventive Care 65 Years and Older, Female Preventive care refers to lifestyle choices and visits with your health care provider that can promote health and wellness. What does preventive care include?  A yearly physical exam. This is also called an annual well check.  Dental exams once or twice a year.  Routine eye exams. Ask your health care provider how often you should have your eyes checked.  Personal lifestyle choices, including:  Daily care of your teeth and gums.  Regular physical activity.  Eating a healthy diet.  Avoiding tobacco and drug use.  Limiting alcohol use.  Practicing safe sex.  Taking low-dose aspirin every day.  Taking vitamin and mineral supplements as recommended by your health care provider. What happens during an annual well check? The services and screenings done by your health care provider during your annual well check will depend on your age, overall health, lifestyle risk factors, and family history of disease. Counseling  Your health care provider may ask you questions about your:  Alcohol use.  Tobacco use.  Drug use.  Emotional well-being.  Home and relationship well-being.  Sexual  activity.  Eating habits.  History of falls.  Memory and ability to understand (cognition).  Work and work Astronomer.  Reproductive health. Screening  You may have the following tests or measurements:  Height, weight, and BMI.  Blood pressure.  Lipid and cholesterol levels. These may be checked every 5 years, or more frequently if you are over 97 years old.  Skin check.  Lung cancer screening. You may have this screening every year starting at age 42 if you have a 30-pack-year history of smoking and currently smoke or have quit within the past 15 years.  Fecal occult blood test (FOBT) of the stool. You may have this test every year starting at age 59.  Flexible sigmoidoscopy or colonoscopy. You may have a sigmoidoscopy every 5 years or a colonoscopy every 10 years starting at age 72.  Hepatitis C blood test.  Hepatitis B blood test.  Sexually transmitted disease (STD) testing.  Diabetes screening. This is done by checking your blood sugar (glucose) after you have not eaten for a while (fasting). You may have this done every 1-3 years.  Bone density scan. This is done to screen for osteoporosis. You may have this done starting at age 21.  Mammogram. This may be done every 1-2 years. Talk to your health care provider about how often you should have regular mammograms. Talk with your health care provider about your test results, treatment options, and if necessary, the need for more tests. Vaccines  Your health care provider may recommend certain vaccines, such as:  Influenza vaccine. This is recommended every year.  Tetanus, diphtheria, and acellular pertussis (Tdap, Td) vaccine. You may need a Td booster every 10  years.  Zoster vaccine. You may need this after age 29.  Pneumococcal 13-valent conjugate (PCV13) vaccine. One dose is recommended after age 68.  Pneumococcal polysaccharide (PPSV23) vaccine. One dose is recommended after age 25. Talk to your health care  provider about which screenings and vaccines you need and how often you need them. This information is not intended to replace advice given to you by your health care provider. Make sure you discuss any questions you have with your health care provider. Document Released: 05/05/2015 Document Revised: 12/27/2015 Document Reviewed: 02/07/2015 Elsevier Interactive Patient Education  2017 [place] Prevention in the Home Falls can cause injuries. They can happen to people of all ages. There are many things you can do to make your home safe and to help prevent falls. What can I do on the outside of my home?  Regularly fix the edges of walkways and driveways and fix any cracks.  Remove anything that might make you trip as you walk through a door, such as a raised step or threshold.  Trim any bushes or trees on the path to your home.  Use bright outdoor lighting.  Clear any walking paths of anything that might make someone trip, such as rocks or tools.  Regularly check to see if handrails are loose or broken. Make sure that both sides of any steps have handrails.  Any raised decks and porches should have guardrails on the edges.  Have any leaves, snow, or ice cleared regularly.  Use sand or salt on walking paths during winter.  Clean up any spills in your garage right away. This includes oil or grease spills. What can I do in the bathroom?  Use night lights.  Install grab bars by the toilet and in the tub and shower. Do not use towel bars as grab bars.  Use non-skid mats or decals in the tub or shower.  If you need to sit down in the shower, use a plastic, non-slip stool.  Keep the floor dry. Clean up any water that spills on the floor as soon as it happens.  Remove soap buildup in the tub or shower regularly.  Attach bath mats securely with double-sided non-slip rug tape.  Do not have throw rugs and other things on the floor that can make you trip. What can I do in  the bedroom?  Use night lights.  Make sure that you have a light by your bed that is easy to reach.  Do not use any sheets or blankets that are too big for your bed. They should not hang down onto the floor.  Have a firm chair that has side arms. You can use this for support while you get dressed.  Do not have throw rugs and other things on the floor that can make you trip. What can I do in the kitchen?  Clean up any spills right away.  Avoid walking on wet floors.  Keep items that you use a lot in easy-to-reach places.  If you need to reach something above you, use a strong step stool that has a grab bar.  Keep electrical cords out of the way.  Do not use floor polish or wax that makes floors slippery. If you must use wax, use non-skid floor wax.  Do not have throw rugs and other things on the floor that can make you trip. What can I do with my stairs?  Do not leave any items on the stairs.  Make sure that  there are handrails on both sides of the stairs and use them. Fix handrails that are broken or loose. Make sure that handrails are as long as the stairways.  Check any carpeting to make sure that it is firmly attached to the stairs. Fix any carpet that is loose or worn.  Avoid having throw rugs at the top or bottom of the stairs. If you do have throw rugs, attach them to the floor with carpet tape.  Make sure that you have a light switch at the top of the stairs and the bottom of the stairs. If you do not have them, ask someone to add them for you. What else can I do to help prevent falls?  Wear shoes that:  Do not have high heels.  Have rubber bottoms.  Are comfortable and fit you well.  Are closed at the toe. Do not wear sandals.  If you use a stepladder:  Make sure that it is fully opened. Do not climb a closed stepladder.  Make sure that both sides of the stepladder are locked into place.  Ask someone to hold it for you, if possible.  Clearly mark and  make sure that you can see:  Any grab bars or handrails.  First and last steps.  Where the edge of each step is.  Use tools that help you move around (mobility aids) if they are needed. These include:  Canes.  Walkers.  Scooters.  Crutches.  Turn on the lights when you go into a dark area. Replace any light bulbs as soon as they burn out.  Set up your furniture so you have a clear path. Avoid moving your furniture around.  If any of your floors are uneven, fix them.  If there are any pets around you, be aware of where they are.  Review your medicines with your doctor. Some medicines can make you feel dizzy. This can increase your chance of falling. Ask your doctor what other things that you can do to help prevent falls. This information is not intended to replace advice given to you by your health care provider. Make sure you discuss any questions you have with your health care provider. Document Released: 02/02/2009 Document Revised: 09/14/2015 Document Reviewed: 05/13/2014 Elsevier Interactive Patient Education  2017 [employer].

## 2018-06-12 NOTE — Progress Notes (Signed)
Subjective:   Crystal Preston is a 78 y.o. female who presents for Medicare Annual (Subsequent) preventive examination.  Review of Systems:   Cardiac Risk Factors include: advanced age (>82men, >48 women);hypertension;obesity (BMI >30kg/m2)     Objective:     Vitals: BP 140/82   Pulse 66   Temp 98.2 F (36.8 C) (Oral)   Ht 5' (1.524 m)   Wt 192 lb 9.6 oz (87.4 kg)   SpO2 97%   BMI 37.61 kg/m   Body mass index is 37.61 kg/m.  Advanced Directives 06/12/2018 06/05/2017 12/06/2016 11/26/2016 06/14/2016 05/24/2016 04/09/2016  Does Patient Have a Medical Advance Directive? No No Yes;No No No No No  Would patient like information on creating a medical advance directive? No - Patient declined Yes (MAU/Ambulatory/Procedural Areas - Information given) - No - Patient declined No - Patient declined No - Patient declined -    Tobacco Social History   Tobacco Use  Smoking Status Never Smoker  Smokeless Tobacco Never Used     Counseling given: Not Answered   Clinical Intake:  Pre-visit preparation completed: No  Pain : 0-10 Pain Score: 4  Pain Type: Chronic pain Pain Location: Knee(hip ) Pain Orientation: Other (Comment)(both knees and hips ) Pain Radiating Towards: back of the thigh  Pain Descriptors / Indicators: Aching, Other (Comment)(worst in the mornings ) Pain Onset: More than a month ago Pain Frequency: Constant Pain Relieving Factors: exercise and ibuprofen  Effect of Pain on Daily Activities: No   Pain Relieving Factors: exercise and ibuprofen   BMI - recorded: 37.61 Nutritional Status: BMI > 30  Obese Nutritional Risks: None Diabetes: No  How often do you need to have someone help you when you read instructions, pamphlets, or other written materials from your doctor or pharmacy?: 1 - Never What is the last grade level you completed in school?: 11 grade   Interpreter Needed?: No  Information entered by :: Dinah [name] FNP-C   Past Medical History:  Diagnosis  Date  . GERD (gastroesophageal reflux disease)   . HTN (hypertension)   . Hyperlipidemia   . Insomnia, unspecified   . Obesity, unspecified   . Osteoarthritis   . Other abnormality of red blood cells    Microcytosis  . Peripheral vascular disease, unspecified (HCC)   . Vertigo, benign positional    Head to left side   Past Surgical History:  Procedure Laterality Date  . ABLATION ON ENDOMETRIOSIS  1980  . CHOLECYSTECTOMY, LAPAROSCOPIC  1989  . COLONOSCOPY  2008  . COLONOSCOPY  2013  . TOTAL ABDOMINAL HYSTERECTOMY  1971   Family History  Problem Relation Age of Onset  . Cancer Father 77  . Cancer Mother 79       cervical cancer  . Glaucoma Brother    Social History   Socioeconomic History  . Marital status: Widowed    Spouse name: Not on file  . Number of children: 2  . Years of education: Not on file  . Highest education level: Not on file  Occupational History  . Occupation: Nature conservation officer  Social Needs  . Financial resource strain: Not hard at all  . Food insecurity:    Worry: Never true    Inability: Never true  . Transportation needs:    Medical: No    Non-medical: No  Tobacco Use  . Smoking status: Never Smoker  . Smokeless tobacco: Never Used  Substance and Sexual Activity  . Alcohol use: No  . Drug use:  No  . Sexual activity: Not on file  Lifestyle  . Physical activity:    Days per week: 4 days    Minutes per session: 60 min  . Stress: Not at all  Relationships  . Social connections:    Talks on phone: Three times a week    Gets together: More than three times a week    Attends religious service: Never    Active member of club or organization: No    Attends meetings of clubs or organizations: Never    Relationship status: Widowed  Other Topics Concern  . Not on file  Social History Narrative   Diet:       Do you drink/ eat things with caffeine? Yes/Coffee      Marital status:  Widowed                             What year were you married ?  [id number]      Do you live in a house, apartment,assistred living, condo, trailer, etc.)?Apt.      Is it one or more stories? 1      How many persons live in your home ? 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   Yes                           Type & how often: Bike/Row 2-5 times a week      Do you have a living will? No      Do you have a DNR form?  No                     If not, do you want to discuss one? No      Do you have signed POA?HPOA forms?  No               If so, please bring to your        appointment    Outpatient Encounter Medications as of 06/12/2018  Medication Sig  . aspirin 81 MG tablet Take 81 mg by mouth daily.  Marland Kitchen Bioflavonoid Products (ESTER C PO) Take 1 tablet (1000 mg ) by mouth once a day  . CALCIUM CITRATE PO Take 1 tablet (1000 mg ) by mouth daily  . Cholecalciferol 4000 units TABS Take 1 tablet by mouth daily  . Cod Liver Oil CAPS Take 1 tablet by mouth daily  . ferrous sulfate 325 (65 FE) MG EC tablet Take 325 mg by mouth daily with breakfast.  . ibandronate (BONIVA) 150 MG tablet TAKE 1 EVERY 30 DAYS IN THE AM WITH FULL GLASS OF WATER ON EMPTY STOMACH. DO NOT LIE DOWN X30 MINUTE  . ibuprofen (ADVIL,MOTRIN) 800 MG tablet Take 800 mg by mouth 2 (two) times daily.  Marland Kitchen losartan (COZAAR) 100 MG tablet TAKE 1 TAB BY MOUTH EVERY DAY  . Naphazoline HCl (CLEAR EYES OP) Apply to eye as needed.   . nystatin cream (MYCOSTATIN) Apply 1 application topically daily as needed (gluteal crease rash).  . pantoprazole (PROTONIX) 40 MG tablet Take 1 tablet (40 mg total) by mouth daily.  . pravastatin (PRAVACHOL) 40 MG tablet TAKE 1 TABLET BY MOUTH EVERY DAY  . hydrochlorothiazide (HYDRODIURIL) 12.5  MG tablet TAKE 1 TAB BY MOUTH DAILY (Patient not taking: Reported on 06/12/2018)  . losartan-hydrochlorothiazide (HYZAAR) 100-12.5 MG tablet TAKE 1 TABLET BY MOUTH EVERY DAY (Patient not  taking: Reported on 06/12/2018)   No facility-administered encounter medications on file as of 06/12/2018.     Activities of Daily Living In your present state of health, do you have any difficulty performing the following activities: 06/12/2018  Hearing? N  Vision? N  Difficulty concentrating or making decisions? N  Walking or climbing stairs? Y  Comment climbing stairs due to knee pain   Dressing or bathing? N  Doing errands, shopping? N  Preparing Food and eating ? N  Using the Toilet? N  In the past six months, have you accidently leaked urine? N  Do you have problems with loss of bowel control? N  Managing your Medications? N  Managing your Finances? N  Housekeeping or managing your Housekeeping? N  Some recent data might be hidden    Patient Care Team: Kermit Baloeed, Tiffany L, DO as PCP - General (Geriatric Medicine) Elise BenneGould, Sigmund, MD as Consulting Physician (Ophthalmology)    Assessment:   This is a routine wellness examination for Blakelyn.  Exercise Activities and Dietary recommendations Current Exercise Habits: Structured exercise class, Type of exercise: treadmill;strength training/weights;walking, Frequency (Times/Week): 4, Intensity: Moderate, Exercise limited by: Other - see comments(knee pain )  Goals    . <enter goal here>     Starting 05/24/16, I will maintain my current lifestyle.        Fall Risk Fall Risk  06/12/2018 12/04/2017 06/05/2017 11/26/2016 05/24/2016  Falls in the past year? 0 No No No No  Number falls in past yr: 0 - - - -  Injury with Fall? 0 - - - -   Is the patient's home free of loose throw rugs in walkways, pet beds, electrical cords, etc?   no      Grab bars in the bathroom? yes      Handrails on the stairs?   no      Adequate lighting?   yes   Depression Screen PHQ 2/9 Scores 06/12/2018 12/04/2017 06/05/2017 11/26/2016  PHQ - 2 Score 0 0 0 0     Cognitive Function MMSE - Mini Mental State Exam 06/12/2018 06/05/2017 05/24/2016  Orientation to time 5  5 5   Orientation to Place 4 5 5   Registration 3 3 3   Attention/ Calculation 5 5 5   Recall 2 1 3   Language- name 2 objects 2 2 2   Language- repeat 1 1 1   Language- follow 3 step command 3 3 2   Language- read & follow direction 1 1 1   Write a sentence 1 1 1   Copy design 1 0 0  Total score 28 27 28         Immunization History  Administered Date(s) Administered  . Influenza,inj,Quad PF,6+ Mos 02/28/2016  . Pneumococcal Conjugate-13 05/24/2016    Qualifies for Shingles Vaccine? Declined   Screening Tests Health Maintenance  Topic Date Due  . INFLUENZA VACCINE  04/23/2019 (Originally 11/20/2017)  . TETANUS/TDAP  06/13/2019 (Originally 11/02/1959)  . PNA vac Low Risk Adult (2 of 2 - PPSV23) 06/13/2019 (Originally 05/24/2017)  . DEXA SCAN  Completed    Cancer Screenings: Lung: Low Dose CT Chest recommended if Age 64-80 years, 30 pack-year currently smoking OR have quit w/in 15years. Patient does not qualify. Breast:  Up to date on Mammogram? No   Up to date of Bone Density/Dexa? Yes Colorectal:Declined  Additional Screenings: Hepatitis C Screening: Declined      Plan:   - Recommend colonoscopy,mammogram screening but declined. - low carbohydrate,low saturated fats and high vegetable diet  I have personally reviewed and noted the following in the patient's chart:   . Medical and social history . Use of alcohol, tobacco or illicit drugs  . Current medications and supplements . Functional ability and status . Nutritional status . Physical activity . Advanced directives . List of other physicians . Hospitalizations, surgeries, and ER visits in previous 12 months . Vitals . Screenings to include cognitive, depression, and falls . Referrals and appointments  In addition, I have reviewed and discussed with patient certain preventive protocols, quality metrics, and best practice recommendations. A written personalized care plan for preventive services as well as general  preventive health recommendations were provided to patient.     Caesar Bookman, NP  06/12/2018

## 2018-06-15 ENCOUNTER — Ambulatory Visit: Payer: Medicare Other | Admitting: Family

## 2018-06-16 ENCOUNTER — Encounter: Payer: Self-pay | Admitting: Family

## 2018-06-16 ENCOUNTER — Ambulatory Visit (INDEPENDENT_AMBULATORY_CARE_PROVIDER_SITE_OTHER): Payer: Medicare Other | Admitting: Family

## 2018-06-16 VITALS — BP 122/70 | HR 71 | Temp 98.3°F | Ht 60.0 in | Wt 192.8 lb

## 2018-06-16 DIAGNOSIS — M159 Polyosteoarthritis, unspecified: Secondary | ICD-10-CM

## 2018-06-16 DIAGNOSIS — I10 Essential (primary) hypertension: Secondary | ICD-10-CM

## 2018-06-16 DIAGNOSIS — E785 Hyperlipidemia, unspecified: Secondary | ICD-10-CM | POA: Diagnosis not present

## 2018-06-16 DIAGNOSIS — M810 Age-related osteoporosis without current pathological fracture: Secondary | ICD-10-CM | POA: Diagnosis not present

## 2018-06-16 DIAGNOSIS — M150 Primary generalized (osteo)arthritis: Secondary | ICD-10-CM | POA: Diagnosis not present

## 2018-06-16 DIAGNOSIS — K219 Gastro-esophageal reflux disease without esophagitis: Secondary | ICD-10-CM | POA: Diagnosis not present

## 2018-06-16 NOTE — Progress Notes (Signed)
Provider: Laniesha Das FNP-C   Gayland Curry, DO  Patient Care Team: Gayland Curry, DO as PCP - General (Geriatric Medicine) Sharyne Peach, MD as Consulting Physician (Ophthalmology)  Extended Emergency Contact Information Primary Emergency Contact: [name],[name] Address: [address] [place], [place] [address] [place] Phone: [phone] Mobile Phone: [phone] Relation: Daughter  Goals of care: Advanced Directive information Advanced Directives 06/16/2018  Does Patient Have a Medical Advance Directive? No  Would patient like information on creating a medical advance directive? No - Patient declined     Chief Complaint  Patient presents with  . Medical Management of Chronic Issues    7 month follow up     HPI:  Pt is a 78 y.o. female seen today for medical management of chronic diseases.shedenies any acute issues during visit.  Hypertension - on losartan 100 mg tablet daily.she states stopped taking HCZT when pharmacist separated losartan -HCZT states made her feel bad.blood pressure has remained within normal limit since stopping HCZT.she denies any headache,dizziness,chest pain or shortness of breath.   Hyperlipidemia  - on Pravachol 40 mg tablet daily.states exercises daily in the gym and watches what she eats.    GERD - on Protonix 40 mg tablet daily.Denies any acid reflux.   Arthritis - Takes ibuprofen 800 mg tablet twice daily.Exercises in the gym daily.   Osteoporosis - on boniva 150 mg tablet daily,calcium and vit D supplement.No recent fractures.    Past Medical History:  Diagnosis Date  . GERD (gastroesophageal reflux disease)   . HTN (hypertension)   . Hyperlipidemia   . Insomnia, unspecified   . Obesity, unspecified   . Osteoarthritis   . Other abnormality of red blood cells    Microcytosis  . Peripheral vascular disease, unspecified ([place])   . Vertigo, benign positional    Head to left side   Past  Surgical History:  Procedure Laterality Date  . ABLATION ON ENDOMETRIOSIS  1980  . CHOLECYSTECTOMY, LAPAROSCOPIC  1989  . COLONOSCOPY  2008  . COLONOSCOPY  2013  . TOTAL ABDOMINAL HYSTERECTOMY  1971    Allergies  Allergen Reactions  . Azithromycin Other (See Comments)    headache, excessive sleepiness  . Naproxen   . Nitrofuran Derivatives Other (See Comments)    Knots on body  . Nitrofurantoin   . Propoxyphene     Darvocet    Allergies as of 06/16/2018      Reactions   Azithromycin Other (See Comments)   headache, excessive sleepiness   Naproxen    Nitrofuran Derivatives Other (See Comments)   Knots on body   Nitrofurantoin    Propoxyphene    Darvocet      Medication List       Accurate as of June 16, 2018  3:46 PM. Always use your most recent med list.        aspirin 81 MG tablet Take 81 mg by mouth daily.   CALCIUM CITRATE PO Take 1 tablet (1000 mg ) by mouth daily   Cholecalciferol 100 MCG (4000 UT) Tabs Take 1 tablet by mouth daily   CLEAR EYES OP Apply to eye as needed.   Cod Liver Oil Caps Take 1 tablet by mouth daily   ESTER C PO Take 1 tablet (1000 mg ) by mouth once a day   ferrous sulfate 325 (65 FE) MG EC tablet Take 325 mg by mouth daily with  breakfast.   ibandronate 150 MG tablet Commonly known as:  BONIVA TAKE 1 EVERY 30 DAYS IN THE AM WITH FULL GLASS OF WATER ON EMPTY STOMACH. DO NOT LIE DOWN X30 MINUTE   ibuprofen 800 MG tablet Commonly known as:  ADVIL,MOTRIN Take 800 mg by mouth 2 (two) times daily.   losartan 100 MG tablet Commonly known as:  COZAAR TAKE 1 TAB BY MOUTH EVERY DAY   losartan-hydrochlorothiazide 100-12.5 MG tablet Commonly known as:  HYZAAR TAKE 1 TABLET BY MOUTH EVERY DAY   nystatin cream Commonly known as:  MYCOSTATIN Apply 1 application topically daily as needed (gluteal crease rash).   pantoprazole 40 MG tablet Commonly known as:  PROTONIX Take 1 tablet (40 mg total) by mouth daily.     pravastatin 40 MG tablet Commonly known as:  PRAVACHOL TAKE 1 TABLET BY MOUTH EVERY DAY       Review of Systems  Constitutional: Negative for appetite change, chills, fatigue and fever.  HENT: Negative for congestion, postnasal drip, rhinorrhea, sinus pressure, sinus pain, sneezing and sore throat.   Eyes: Negative for pain, discharge, redness, itching and visual disturbance.  Respiratory: Negative for cough, chest tightness, shortness of breath and wheezing.   Cardiovascular: Negative for chest pain, palpitations and leg swelling.  Gastrointestinal: Negative for abdominal distention, abdominal pain, constipation, diarrhea, nausea and vomiting.  Endocrine: Negative for cold intolerance, heat intolerance, polydipsia, polyphagia and polyuria.  Genitourinary: Negative for dysuria, flank pain, frequency and urgency.  Musculoskeletal: Positive for arthralgias. Negative for gait problem.  Skin: Negative for color change, pallor and rash.  Neurological: Negative for dizziness, light-headedness and headaches.  Psychiatric/Behavioral: Negative for agitation, confusion and sleep disturbance. The patient is not nervous/anxious.     Immunization History  Administered Date(s) Administered  . Influenza,inj,Quad PF,6+ Mos 02/28/2016  . Pneumococcal Conjugate-13 05/24/2016   Pertinent  Health Maintenance Due  Topic Date Due  . INFLUENZA VACCINE  04/23/2019 (Originally 11/20/2017)  . PNA vac Low Risk Adult (2 of 2 - PPSV23) 06/13/2019 (Originally 05/24/2017)  . DEXA SCAN  Completed   Fall Risk  06/16/2018 06/12/2018 12/04/2017 06/05/2017 11/26/2016  Falls in the past year? 0 0 No No No  Number falls in past yr: 0 0 - - -  Injury with Fall? 0 0 - - -    Vitals:   06/16/18 1459  BP: 122/70  Pulse: 71  Temp: 98.3 F (36.8 C)  TempSrc: Oral  SpO2: 97%  Weight: 192 lb 12.8 oz (87.5 kg)  Height: 5' (1.524 m)   Body mass index is 37.65 kg/m. Physical Exam Constitutional:      General: She is  not in acute distress. HENT:     Head: Normocephalic.     Right Ear: Tympanic membrane, ear canal and external ear normal. There is no impacted cerumen.     Left Ear: Tympanic membrane, ear canal and external ear normal. There is no impacted cerumen.     Nose: Nose normal. No congestion or rhinorrhea.     Mouth/Throat:     Mouth: Mucous membranes are moist.     Pharynx: Oropharynx is clear. No oropharyngeal exudate or posterior oropharyngeal erythema.  Eyes:     General: No scleral icterus.       Right eye: No discharge.        Left eye: No discharge.     Extraocular Movements: Extraocular movements intact.     Conjunctiva/sclera: Conjunctivae normal.     Pupils: Pupils are equal, round,  and reactive to light.  Neck:     Musculoskeletal: Normal range of motion. No neck rigidity or muscular tenderness.     Vascular: No carotid bruit.  Cardiovascular:     Rate and Rhythm: Normal rate and regular rhythm.     Pulses: Normal pulses.     Heart sounds: No murmur. No friction rub. No gallop.   Pulmonary:     Effort: Pulmonary effort is normal. No respiratory distress.     Breath sounds: Normal breath sounds. No wheezing, rhonchi or rales.  Chest:     Chest wall: No tenderness.  Abdominal:     General: Bowel sounds are normal. There is no distension.     Palpations: Abdomen is soft. There is no mass.     Tenderness: There is no abdominal tenderness. There is no right CVA tenderness, left CVA tenderness, guarding or rebound.  Musculoskeletal: Normal range of motion.        General: No swelling or tenderness.     Right lower leg: No edema.     Left lower leg: No edema.  Lymphadenopathy:     Cervical: No cervical adenopathy.  Skin:    General: Skin is warm and dry.     Capillary Refill: Capillary refill takes 2 to 3 seconds.     Coloration: Skin is not pale.     Findings: No erythema or rash.  Neurological:     Mental Status: She is alert and oriented to person, place, and time.      Cranial Nerves: No cranial nerve deficit.     Sensory: No sensory deficit.     Motor: No weakness.     Coordination: Coordination normal.     Gait: Gait normal.  Psychiatric:        Mood and Affect: Mood normal.        Behavior: Behavior normal.        Judgment: Judgment normal.    Labs reviewed: Recent Labs    12/04/17 1514 06/01/18 1329  NA 139 140  K 4.0 4.3  CL 105 102  CO2 27 28  GLUCOSE 98 97  BUN 13 12  CREATININE 0.64 0.72  CALCIUM 9.4 10.2   Recent Labs    12/04/17 1514 06/01/18 1329  AST 20 19  ALT 14 15  BILITOT 0.2 0.3  PROT 7.0 7.5   Recent Labs    12/04/17 1514 06/01/18 1329  WBC 10.9* 11.2*  NEUTROABS 7,521 8,266*  HGB 11.5* 12.4  HCT 36.3 39.9  MCV 73.2* 72.4*  PLT 363 374   Lab Results  Component Value Date   TSH 0.96 06/05/2016   No results found for: HGBA1C Lab Results  Component Value Date   CHOL 188 06/01/2018   HDL 55 06/01/2018   LDLCALC 107 (H) 06/01/2018   TRIG 149 06/01/2018   CHOLHDL 3.4 06/01/2018    Significant Diagnostic Results in last 30 days:  No results found.  Assessment/Plan 1. Essential hypertension B/p stable.continue on losartan 100 mg tablet daily.she stopped  Hydrochlorothiazide states felt bad while taking HCZT   - CBC with Differential/Platelet; Future - CMP with eGFR([facility]); Future - TSH; Future  2. Hyperlipidemia, unspecified hyperlipidemia type Continue on low carbohydrate,low saturated fats and high vegetable diet and exercise.continue on Pravachol 40 mg tablet daily. - Lipid panel; Future  3. Gastroesophageal reflux disease without esophagitis Asymptomatic.continue on Protonix 40 mg tablet daily.   4. Primary osteoarthritis involving multiple joints Continue on ibuprofen and exercise.   5.  Age related osteoporosis, unspecified pathological fracture presence Continue on boniva 150 mg tablet daily,calcium and vit D supplement.  Family/ staff Communication: Reviewed plan of care with  patient.  Labs/tests ordered:  - CBC with Differential/Platelet; Future - CMP with eGFR([facility]); Future - TSH; Future  Sandrea Hughs, NP

## 2018-06-18 DIAGNOSIS — M7061 Trochanteric bursitis, right hip: Secondary | ICD-10-CM | POA: Diagnosis not present

## 2018-06-24 ENCOUNTER — Telehealth: Payer: Self-pay | Admitting: *Deleted

## 2018-06-24 NOTE — Telephone Encounter (Signed)
Patient called and stated that she has thrush in her mouth. Stated that the Orthopaedic Dr. Jovita Gamma her an injection and now she has thrush. Patient wanted something called in.   Advised patient that we would need to see her first and offered appointment for tomorrow but patient refused and stated that she could not come in at all this week. Advised patient to call Orthopaedic office to let them know and she agreed.

## 2018-06-25 NOTE — Telephone Encounter (Signed)
Noted.  Ideally we should indeed confirm she does have thrush as you advised.

## 2018-07-27 ENCOUNTER — Other Ambulatory Visit: Payer: Self-pay | Admitting: *Deleted

## 2018-07-27 MED ORDER — PRAVASTATIN SODIUM 40 MG PO TABS: 40.0000 mg | ORAL_TABLET | Freq: Every day | ORAL | 1 refills | Status: DC

## 2018-08-16 ENCOUNTER — Encounter: Payer: Self-pay | Admitting: Internal Medicine

## 2018-08-17 ENCOUNTER — Other Ambulatory Visit: Payer: Self-pay | Admitting: Nurse Practitioner

## 2018-09-09 ENCOUNTER — Other Ambulatory Visit: Payer: Self-pay | Admitting: Internal Medicine

## 2018-09-23 ENCOUNTER — Other Ambulatory Visit: Payer: Self-pay | Admitting: Internal Medicine

## 2018-12-21 ENCOUNTER — Other Ambulatory Visit: Payer: Self-pay | Admitting: Family

## 2018-12-24 ENCOUNTER — Ambulatory Visit: Payer: Medicare Other | Admitting: Family

## 2019-01-08 ENCOUNTER — Other Ambulatory Visit: Payer: Self-pay

## 2019-01-08 ENCOUNTER — Ambulatory Visit (INDEPENDENT_AMBULATORY_CARE_PROVIDER_SITE_OTHER): Payer: Medicare Other | Admitting: Family

## 2019-01-08 ENCOUNTER — Encounter: Payer: Self-pay | Admitting: Family

## 2019-01-08 VITALS — BP 160/86 | HR 75 | Temp 98.7°F | Resp 20 | Ht 60.0 in | Wt 192.0 lb

## 2019-01-08 DIAGNOSIS — E785 Hyperlipidemia, unspecified: Secondary | ICD-10-CM

## 2019-01-08 DIAGNOSIS — B372 Candidiasis of skin and nail: Secondary | ICD-10-CM | POA: Diagnosis not present

## 2019-01-08 DIAGNOSIS — M159 Polyosteoarthritis, unspecified: Secondary | ICD-10-CM

## 2019-01-08 DIAGNOSIS — I10 Essential (primary) hypertension: Secondary | ICD-10-CM

## 2019-01-08 DIAGNOSIS — M810 Age-related osteoporosis without current pathological fracture: Secondary | ICD-10-CM | POA: Diagnosis not present

## 2019-01-08 DIAGNOSIS — R739 Hyperglycemia, unspecified: Secondary | ICD-10-CM | POA: Diagnosis not present

## 2019-01-08 DIAGNOSIS — R7303 Prediabetes: Secondary | ICD-10-CM

## 2019-01-08 DIAGNOSIS — M150 Primary generalized (osteo)arthritis: Secondary | ICD-10-CM

## 2019-01-08 DIAGNOSIS — K219 Gastro-esophageal reflux disease without esophagitis: Secondary | ICD-10-CM

## 2019-01-08 MED ORDER — PANTOPRAZOLE SODIUM 40 MG PO TBEC: 40.0000 mg | DELAYED_RELEASE_TABLET | Freq: Every day | ORAL | 0 refills | Status: DC

## 2019-01-08 MED ORDER — ACETAMINOPHEN 500 MG PO TABS: 500.0000 mg | ORAL_TABLET | Freq: Three times a day (TID) | ORAL | 0 refills | Status: AC | PRN

## 2019-01-08 MED ORDER — LOSARTAN POTASSIUM 100 MG PO TABS: 100.0000 mg | ORAL_TABLET | Freq: Every day | ORAL | 0 refills | Status: DC

## 2019-01-08 MED ORDER — IBANDRONATE SODIUM 150 MG PO TABS: ORAL_TABLET | ORAL | 12 refills | Status: DC

## 2019-01-08 MED ORDER — HYDRALAZINE HCL 25 MG PO TABS: 25.0000 mg | ORAL_TABLET | Freq: Three times a day (TID) | ORAL | 0 refills | Status: DC

## 2019-01-08 NOTE — Progress Notes (Signed)
Provider: Marlowe Sax FNP-C   Jiyan Walkowski, Nelda Bucks, NP  Patient Care Team: Brelynn Wheller, Nelda Bucks, NP as PCP - General (Family Medicine) Sharyne Peach, MD as Consulting Physician (Ophthalmology)  Extended Emergency Contact Information Primary Emergency Contact: [name],[name] Address: [address] [place], [place] [address] [place] of [place] Phone: [phone] Mobile Phone: [phone] Relation: Daughter  Code Status: Goals of care: Advanced Directive information Advanced Directives 01/08/2019  Does Patient Have a Medical Advance Directive? Yes  Would patient like information on creating a medical advance directive? -     Chief Complaint  Patient presents with  . Medical Management of Chronic Issues    Routine visit of medical mangement  . Acute Visit    c/o right hip pain    HPI:  Pt is a 78 y.o. female seen today for medical management of chronic diseases.she complains of right hip pain 7/10.she states started going back to the GYM which seems to have aggravated it.she has an appointment next Thursday with Dr.Rowan for cortisol injection.Tylenol eases off pain but not resolved.   Hypertension -she does not check her blood pressure at home.she denies any signs of hyper/hypotesion.currently taking Losartan 100 mg tablet daily.Stopped HCZT in the past stated made her feel bad.    Hyperlipidemia - on pravastatin 40 mg tablet daily.she is back to exercising in the [place].Also watches what she eats.No weight gain.Has lost one pound over seven months.   GERD- on Protonix 40 mg tablet daily.she states symptoms under control.But has had some difficulties swallowing her pills.sometimes swallows well but feels like it's stuck in the throat and chest area .she states GI did an endoscopy in the past and will call for evaluation.she will notify [facility] office if referral is needed.Also discussed swallow study/Speech Therapy but would like to call her Gastroenterology for  appointment.   Osteoporosis - on Boniva 150 mg Tablet every 30 days and calcium/vitD supplements.she denies any fall or fractures.Latest Dexa scan 03/22/2016 T-Score -2.4    Past Medical History:  Diagnosis Date  . GERD (gastroesophageal reflux disease)   . HTN (hypertension)   . Hyperlipidemia   . Insomnia, unspecified   . Obesity, unspecified   . Osteoarthritis   . Other abnormality of red blood cells    Microcytosis  . Peripheral vascular disease, unspecified ([place])   . Vertigo, benign positional    Head to left side   Past Surgical History:  Procedure Laterality Date  . ABLATION ON ENDOMETRIOSIS  1980  . CHOLECYSTECTOMY, LAPAROSCOPIC  1989  . COLONOSCOPY  2008  . COLONOSCOPY  2013  . TOTAL ABDOMINAL HYSTERECTOMY  1971    Allergies  Allergen Reactions  . Azithromycin Other (See Comments)    headache, excessive sleepiness  . Naproxen   . Nitrofuran Derivatives Other (See Comments)    Knots on body  . Nitrofurantoin   . Propoxyphene     Darvocet    Allergies as of 01/08/2019      Reactions   Azithromycin Other (See Comments)   headache, excessive sleepiness   Naproxen    Nitrofuran Derivatives Other (See Comments)   Knots on body   Nitrofurantoin    Propoxyphene    Darvocet      Medication List       Accurate as of January 08, 2019 11:59 PM. If you have any questions, ask your nurse or doctor.        acetaminophen 500 MG  tablet Commonly known as: TYLENOL Take 1 tablet (500 mg total) by mouth every 8 (eight) hours as needed. Started by: Caesar Bookmaninah C Jaylnn Ullery, NP   aspirin 81 MG tablet Take 81 mg by mouth daily.   CALCIUM CITRATE PO Take 1 tablet (1000 mg ) by mouth daily   Cholecalciferol 100 MCG (4000 UT) Tabs Take 1 tablet by mouth daily   CLEAR EYES OP Apply to eye as needed.   Cod Liver Oil Caps Take 1 tablet by mouth daily   ESTER C PO Take 1 tablet (1000 mg ) by mouth once a day   ferrous sulfate 325 (65 FE) MG EC tablet Take 325 mg by  mouth daily with breakfast.   hydrALAZINE 25 MG tablet Commonly known as: APRESOLINE Take 1 tablet (25 mg total) by mouth 3 (three) times daily. Started by: Caesar Bookmaninah C Xandra Laramee, NP   ibandronate 150 MG tablet Commonly known as: BONIVA TAKE 1 EVERY 30 DAYS IN THE AM WITH FULL GLASS OF WATER ON EMPTY STOMACH. DO NOT LIE DOWN X30 MINUTE   ibuprofen 800 MG tablet Commonly known as: ADVIL Take 800 mg by mouth 2 (two) times daily.   losartan 100 MG tablet Commonly known as: COZAAR Take 1 tablet (100 mg total) by mouth daily.   losartan-hydrochlorothiazide 100-12.5 MG tablet Commonly known as: HYZAAR TAKE 1 TABLET BY MOUTH EVERY DAY   nystatin cream Commonly known as: MYCOSTATIN Apply 1 application topically daily as needed (gluteal crease rash).   pantoprazole 40 MG tablet Commonly known as: PROTONIX Take 1 tablet (40 mg total) by mouth daily. Please call to schedule follow up appointment   pravastatin 40 MG tablet Commonly known as: PRAVACHOL Take 1 tablet (40 mg total) by mouth daily.       Review of Systems  Constitutional: Negative for appetite change, chills, fatigue and fever.  HENT: Positive for tinnitus. Negative for congestion, hearing loss, rhinorrhea, sinus pressure, sinus pain, sneezing and sore throat.        Having trouble swallowing pills but not food x 3 weeks.   Eyes: Positive for visual disturbance. Negative for pain, discharge, redness and itching.       Wears eye glasses follows up Opthalmology.   Respiratory: Negative for cough, chest tightness, shortness of breath and wheezing.   Cardiovascular: Negative for chest pain, palpitations and leg swelling.  Gastrointestinal: Negative for abdominal distention, abdominal pain, constipation, diarrhea, nausea and vomiting.  Endocrine: Negative for cold intolerance, heat intolerance, polydipsia, polyphagia and polyuria.  Genitourinary: Negative for difficulty urinating, dysuria, flank pain, frequency and urgency.   Musculoskeletal: Positive for arthralgias. Negative for gait problem, neck pain and neck stiffness.       Right hip pain   Skin: Negative for color change, pallor, rash and wound.  Neurological: Positive for numbness. Negative for dizziness, speech difficulty, weakness, light-headedness and headaches.       Right leg Numbness across the toes and sometimes.   Hematological: Does not bruise/bleed easily.  Psychiatric/Behavioral: Negative for agitation, confusion and sleep disturbance. The patient is not nervous/anxious.     Immunization History  Administered Date(s) Administered  . Influenza,inj,Quad PF,6+ Mos 02/28/2016  . Pneumococcal Conjugate-13 05/24/2016   Pertinent  Health Maintenance Due  Topic Date Due  . INFLUENZA VACCINE  02/07/2019 (Originally 11/21/2018)  . PNA vac Low Risk Adult (2 of 2 - PPSV23) 06/13/2019 (Originally 05/24/2017)  . DEXA SCAN  Completed   Fall Risk  01/08/2019 06/16/2018 06/12/2018 12/04/2017 06/05/2017  Falls in  the past year? 0 0 0 No No  Number falls in past yr: - 0 0 - -  Injury with Fall? 0 0 0 - -    Vitals:   01/08/19 1312  BP: (!) 160/86  Pulse: 75  Resp: 20  Temp: 98.7 F (37.1 C)  TempSrc: Oral  SpO2: 98%  Weight: 192 lb (87.1 kg)  Height: 5' (1.524 m)   Body mass index is 37.5 kg/m. Physical Exam Vitals signs reviewed.  Constitutional:      General: She is not in acute distress.    Appearance: She is obese. She is not ill-appearing.  HENT:     Head: Normocephalic.     Right Ear: Tympanic membrane, ear canal and external ear normal. There is no impacted cerumen.     Left Ear: Tympanic membrane, ear canal and external ear normal. There is no impacted cerumen.     Nose: Nose normal. No congestion or rhinorrhea.     Mouth/Throat:     Mouth: Mucous membranes are moist.     Pharynx: Oropharynx is clear. No oropharyngeal exudate or posterior oropharyngeal erythema.  Eyes:     General: No scleral icterus.       Right eye: No discharge.         Left eye: No discharge.     Extraocular Movements: Extraocular movements intact.     Conjunctiva/sclera: Conjunctivae normal.     Pupils: Pupils are equal, round, and reactive to light.  Neck:     Musculoskeletal: Normal range of motion. No neck rigidity or muscular tenderness.     Vascular: No carotid bruit.  Cardiovascular:     Rate and Rhythm: Normal rate and regular rhythm.     Pulses: Normal pulses.     Heart sounds: Normal heart sounds. No murmur. No friction rub. No gallop.   Pulmonary:     Effort: Pulmonary effort is normal. No respiratory distress.     Breath sounds: Normal breath sounds. No wheezing, rhonchi or rales.  Chest:     Chest wall: No tenderness.  Abdominal:     General: Bowel sounds are normal. There is no distension.     Palpations: Abdomen is soft. There is no mass.     Tenderness: There is no abdominal tenderness. There is no right CVA tenderness, left CVA tenderness, guarding or rebound.  Musculoskeletal:        General: No swelling or tenderness.     Right lower leg: No edema.     Left lower leg: No edema.     Comments: FROM except right hip limited due to pain   Lymphadenopathy:     Cervical: No cervical adenopathy.  Skin:    General: Skin is warm and dry.     Coloration: Skin is not pale.     Findings: No bruising, erythema, lesion or rash.  Neurological:     Mental Status: She is alert and oriented to person, place, and time.     Cranial Nerves: No cranial nerve deficit.     Sensory: No sensory deficit.     Motor: No weakness.     Coordination: Coordination normal.     Gait: Gait normal.  Psychiatric:        Mood and Affect: Mood normal.        Behavior: Behavior normal.        Thought Content: Thought content normal.        Judgment: Judgment normal.    Labs reviewed: Recent  Labs    06/01/18 1329 01/08/19 1417  NA 140 139  K 4.3 3.8  CL 102 104  CO2 28 25  GLUCOSE 97 109  BUN 12 10  CREATININE 0.72 0.68  CALCIUM 10.2 9.6    Recent Labs    06/01/18 1329 01/08/19 1417  AST 19 20  ALT 15 17  BILITOT 0.3 0.3  PROT 7.5 7.5   Recent Labs    06/01/18 1329 01/08/19 1417  WBC 11.2* 11.2*  NEUTROABS 8,266* 8,310*  HGB 12.4 11.5*  HCT 39.9 36.7  MCV 72.4* 72.5*  PLT 374 365   Lab Results  Component Value Date   TSH 0.93 01/08/2019   Lab Results  Component Value Date   HGBA1C 5.9 (H) 01/08/2019   Lab Results  Component Value Date   CHOL 181 01/08/2019   HDL 49 (L) 01/08/2019   LDLCALC 95 01/08/2019   TRIG 258 (H) 01/08/2019   CHOLHDL 3.7 01/08/2019    Significant Diagnostic Results in last 30 days:  No results found.  Assessment/Plan 1. Age related osteoporosis, unspecified pathological fracture presence Latest Dexa scan 03/22/2016 T-Score -2.4 no fracture.continue on Boniva 150 mg Tablet every 30 days and calcium/vitD supplements. - ibandronate (BONIVA) 150 MG tablet; TAKE 1 EVERY 30 DAYS IN THE AM WITH FULL GLASS OF WATER ON EMPTY STOMACH. DO NOT LIE DOWN X30 MINUTE  Dispense: 3 tablet; Refill: 12  2. Candidal skin infection Request refill for Nystatin though skin redness has resolved.will refill if symptoms recurs.  3. Essential hypertension B/p not at goal.will have check blood pressure at home then provider readings for evaluation.will add amlodipine if SBP>140 - losartan (COZAAR) 100 MG tablet; Take 1 tablet (100 mg total) by mouth daily.  Dispense: 90 tablet; Refill: 0  4. Hyperlipidemia, unspecified hyperlipidemia type Recent LDL at goal but Triglycerides are high.Continue on pravastatin 40 mg tablet daily,dietary and lifestyle modification.   5. Gastroesophageal reflux disease without esophagitis Symptomatic.will contact GI for evaluation.will notify provider if referral is needed.she has established care with GI in the past.  - pantoprazole (PROTONIX) 40 MG tablet; Take 1 tablet (40 mg total) by mouth daily. Please call to schedule follow up appointment  Dispense: 90 tablet;  Refill: 0  6. Primary osteoarthritis involving multiple joints Worst on right hip.has upcomming appointment with orthopedic for cortisol injection.  Family/ staff Communication: Reviewed plan of care with patient.  Labs/tests ordered: None  Next appointment: 2 weeks for blood pressure check.   Caesar Bookmaninah C [place], NP

## 2019-01-12 LAB — COMPLETE METABOLIC PANEL WITH GFR
AG Ratio: 1.5 (calc) (ref 1.0–2.5)
ALT: 17 U/L (ref 6–29)
AST: 20 U/L (ref 10–35)
Albumin: 4.5 g/dL (ref 3.6–5.1)
Alkaline phosphatase (APISO): 51 U/L (ref 37–153)
BUN: 10 mg/dL (ref 7–25)
CO2: 25 mmol/L (ref 20–32)
Calcium: 9.6 mg/dL (ref 8.6–10.4)
Chloride: 104 mmol/L (ref 98–110)
Creat: 0.68 mg/dL (ref 0.60–0.93)
GFR, Est African American: 96 mL/min/{1.73_m2} (ref >=60)
GFR, Est Non African American: 83 mL/min/{1.73_m2} (ref >=60)
Globulin: 3 g/dL (calc) (ref 1.9–3.7)
Glucose, Bld: 109 mg/dL (ref 65–139)
Potassium: 3.8 mmol/L (ref 3.5–5.3)
Sodium: 139 mmol/L (ref 135–146)
Total Bilirubin: 0.3 mg/dL (ref 0.2–1.2)
Total Protein: 7.5 g/dL (ref 6.1–8.1)

## 2019-01-12 LAB — CBC WITH DIFFERENTIAL/PLATELET
Absolute Monocytes: 762 cells/uL (ref 200–950)
Basophils Absolute: 45 cells/uL (ref 0–200)
Basophils Relative: 0.4 %
Eosinophils Absolute: 157 cells/uL (ref 15–500)
Eosinophils Relative: 1.4 %
HCT: 36.7 % (ref 35.0–45.0)
Hemoglobin: 11.5 g/dL — ABNORMAL LOW (ref 11.7–15.5)
Lymphs Abs: 1926 cells/uL (ref 850–3900)
MCH: 22.7 pg — ABNORMAL LOW (ref 27.0–33.0)
MCHC: 31.3 g/dL — ABNORMAL LOW (ref 32.0–36.0)
MCV: 72.5 fL — ABNORMAL LOW (ref 80.0–100.0)
MPV: 9.7 fL (ref 7.5–12.5)
Monocytes Relative: 6.8 %
Neutro Abs: 8310 cells/uL — ABNORMAL HIGH (ref 1500–7800)
Neutrophils Relative %: 74.2 %
Platelets: 365 10*3/uL (ref 140–400)
RBC: 5.06 10*6/uL (ref 3.80–5.10)
RDW: 14.2 % (ref 11.0–15.0)
Total Lymphocyte: 17.2 %
WBC: 11.2 10*3/uL — ABNORMAL HIGH (ref 3.8–10.8)

## 2019-01-12 LAB — HEMOGLOBIN A1C
Hgb A1c MFr Bld: 5.9 % of total Hgb — ABNORMAL HIGH (ref <5.7)
Mean Plasma Glucose: 123 (calc)
eAG (mmol/L): 6.8 (calc)

## 2019-01-12 LAB — LIPID PANEL
Cholesterol: 181 mg/dL (ref <200)
HDL: 49 mg/dL — ABNORMAL LOW (ref >=50)
LDL Cholesterol (Calc): 95 mg/dL (calc)
Non-HDL Cholesterol (Calc): 132 mg/dL (calc) — ABNORMAL HIGH (ref <130)
Total CHOL/HDL Ratio: 3.7 (calc) (ref <5.0)
Triglycerides: 258 mg/dL — ABNORMAL HIGH (ref <150)

## 2019-01-12 LAB — TEST AUTHORIZATION

## 2019-01-12 LAB — TSH: TSH: 0.93 mIU/L (ref 0.40–4.50)

## 2019-01-14 ENCOUNTER — Other Ambulatory Visit: Payer: Self-pay

## 2019-01-14 DIAGNOSIS — D72829 Elevated white blood cell count, unspecified: Secondary | ICD-10-CM

## 2019-01-14 DIAGNOSIS — M7061 Trochanteric bursitis, right hip: Secondary | ICD-10-CM | POA: Diagnosis not present

## 2019-01-18 ENCOUNTER — Other Ambulatory Visit: Payer: Medicare Other

## 2019-01-18 ENCOUNTER — Other Ambulatory Visit: Payer: Self-pay

## 2019-01-18 DIAGNOSIS — D72829 Elevated white blood cell count, unspecified: Secondary | ICD-10-CM | POA: Diagnosis not present

## 2019-01-20 ENCOUNTER — Other Ambulatory Visit: Payer: Self-pay | Admitting: Internal Medicine

## 2019-01-20 LAB — URINALYSIS
Bilirubin Urine: NEGATIVE
Glucose, UA: NEGATIVE
Hgb urine dipstick: NEGATIVE
Ketones, ur: NEGATIVE
Nitrite: POSITIVE — AB
Protein, ur: NEGATIVE
Specific Gravity, Urine: 1.008 (ref 1.001–1.03)
pH: 7 (ref 5.0–8.0)

## 2019-01-20 LAB — URINE CULTURE
MICRO NUMBER:: 930119
SPECIMEN QUALITY:: ADEQUATE

## 2019-01-21 ENCOUNTER — Telehealth: Payer: Self-pay

## 2019-01-21 MED ORDER — SACCHAROMYCES BOULARDII 250 MG PO CAPS: 250.0000 mg | ORAL_CAPSULE | Freq: Two times a day (BID) | ORAL | 0 refills | Status: AC

## 2019-01-21 MED ORDER — CIPROFLOXACIN HCL 500 MG PO TABS: 500.0000 mg | ORAL_TABLET | Freq: Two times a day (BID) | ORAL | 0 refills | Status: AC

## 2019-01-21 NOTE — Telephone Encounter (Signed)
-----   Message from Sandrea Hughs, NP sent at 01/20/2019  5:02 PM EDT ----- Urine culture indicates E.Coli > 100,000 colonies.Start on Cipro 500 mg tablet one by mouth twice daily x 7 days along with Florastor 250 mg capsule one by mouth twice daily x 10 days.Increase fluid intake.

## 2019-01-21 NOTE — Telephone Encounter (Signed)
Discussed results with patient, patient verbalized understanding of results  RX's sent to local pharmacy on file, confirmed.

## 2019-01-22 ENCOUNTER — Other Ambulatory Visit: Payer: Self-pay

## 2019-01-22 ENCOUNTER — Ambulatory Visit (INDEPENDENT_AMBULATORY_CARE_PROVIDER_SITE_OTHER): Payer: Medicare Other | Admitting: Family

## 2019-01-22 ENCOUNTER — Encounter: Payer: Self-pay | Admitting: Family

## 2019-01-22 VITALS — BP 150/96 | HR 80 | Temp 98.5°F | Resp 20 | Ht 60.0 in | Wt 187.8 lb

## 2019-01-22 DIAGNOSIS — N3000 Acute cystitis without hematuria: Secondary | ICD-10-CM | POA: Diagnosis not present

## 2019-01-22 DIAGNOSIS — M8949 Other hypertrophic osteoarthropathy, multiple sites: Secondary | ICD-10-CM

## 2019-01-22 DIAGNOSIS — H9311 Tinnitus, right ear: Secondary | ICD-10-CM | POA: Diagnosis not present

## 2019-01-22 DIAGNOSIS — I10 Essential (primary) hypertension: Secondary | ICD-10-CM

## 2019-01-22 DIAGNOSIS — R1312 Dysphagia, oropharyngeal phase: Secondary | ICD-10-CM

## 2019-01-22 DIAGNOSIS — M150 Primary generalized (osteo)arthritis: Secondary | ICD-10-CM

## 2019-01-22 DIAGNOSIS — Z780 Asymptomatic menopausal state: Secondary | ICD-10-CM | POA: Diagnosis not present

## 2019-01-22 DIAGNOSIS — M159 Polyosteoarthritis, unspecified: Secondary | ICD-10-CM

## 2019-01-22 MED ORDER — IBUPROFEN 800 MG PO TABS: 800.0000 mg | ORAL_TABLET | Freq: Two times a day (BID) | ORAL | 0 refills | Status: DC | PRN

## 2019-01-22 NOTE — Patient Instructions (Signed)
Check blood pressure daily and notify provider if B/p > 140/90

## 2019-01-22 NOTE — Progress Notes (Signed)
Provider: Marlowe Sax FNP-C  Chaney Maclaren, Nelda Bucks, NP  Patient Care Team: Darwin Rothlisberger, Nelda Bucks, NP as PCP - General (Family Medicine) Sharyne Peach, MD as Consulting Physician (Ophthalmology)  Extended Emergency Contact Information Primary Emergency Contact: [name],[name] Address: [address] [place], [place] [address] Johnnette Litter of [place] Phone: [phone] Mobile Phone: [phone] Relation: Daughter  Code Status:  Goals of care: Advanced Directive information Advanced Directives 01/08/2019  Does Patient Have a Medical Advance Directive? Yes  Would patient like information on creating a medical advance directive? -     Chief Complaint  Patient presents with  . Follow-up    2 Week follow up Blood Presure Check, c/o feeling wosey ,tired     HPI:  Pt is a 78 y.o. female seen today for an acute visit for 2 weeks follow up for high blood pressure.she states feeling "Wosey like dizzy and tired". Ringing in the ears.states took hydralazine and it made her feel so bad.she stopped it.  She state no longer has any pain wonders whether she needs to continue with ibuprofen. Her blood pressure has improved this visit.she states did not check her blood pressure at home.B/p machine works but states needs a table to place her hand to be on heart level.No home blood pressure log bought in this visit.  Ringing in the ears getting worse  "Just roaring and dinging".   Some confusion noted this visit seems to provide ideas then jump to something else in the middle of an other sentence.unclear if taking her medication as directed.  Recent urine culture showed E.coli > 100,000 colonies Cipro 500 mg tablet one by mouth twice daily along with Florastor 250 mg capsule one by mouth twice daily x 10 days.she states has not picked up the medication from pharmacy.states pharmacist did not call her to tell her it's ready.    Past Medical History:  Diagnosis Date  . GERD  (gastroesophageal reflux disease)   . HTN (hypertension)   . Hyperlipidemia   . Insomnia, unspecified   . Obesity, unspecified   . Osteoarthritis   . Other abnormality of red blood cells    Microcytosis  . Peripheral vascular disease, unspecified ([place])   . Vertigo, benign positional    Head to left side   Past Surgical History:  Procedure Laterality Date  . ABLATION ON ENDOMETRIOSIS  1980  . CHOLECYSTECTOMY, LAPAROSCOPIC  1989  . COLONOSCOPY  2008  . COLONOSCOPY  2013  . TOTAL ABDOMINAL HYSTERECTOMY  1971    Allergies  Allergen Reactions  . Azithromycin Other (See Comments)    headache, excessive sleepiness  . Naproxen   . Nitrofuran Derivatives Other (See Comments)    Knots on body  . Nitrofurantoin   . Propoxyphene     Darvocet    Outpatient Encounter Medications as of 01/22/2019  Medication Sig  . acetaminophen (TYLENOL) 500 MG tablet Take 1 tablet (500 mg total) by mouth every 8 (eight) hours as needed.  . Cholecalciferol 4000 units TABS Take 1 tablet by mouth daily  . Cod Liver Oil CAPS Take 1 tablet by mouth daily  . ibandronate (BONIVA) 150 MG tablet TAKE 1 EVERY 30 DAYS IN THE AM WITH FULL GLASS OF WATER ON EMPTY STOMACH. DO NOT LIE DOWN X30 MINUTE  . ibuprofen (ADVIL,MOTRIN) 800 MG tablet Take 800 mg by mouth 2 (two) times daily.  Marland Kitchen losartan (COZAAR) 100 MG tablet Take 1 tablet (100  mg total) by mouth daily.  . Naphazoline HCl (CLEAR EYES OP) Apply to eye as needed.   . pantoprazole (PROTONIX) 40 MG tablet Take 1 tablet (40 mg total) by mouth daily. Please call to schedule follow up appointment  . pravastatin (PRAVACHOL) 40 MG tablet TAKE 1 TABLET BY MOUTH EVERY DAY  . saccharomyces boulardii (FLORASTOR) 250 MG capsule Take 1 capsule (250 mg total) by mouth 2 (two) times daily for 10 days.  Marland Kitchen aspirin 81 MG tablet Take 81 mg by mouth daily.  Marland Kitchen Bioflavonoid Products (ESTER C PO) Take 1 tablet (1000 mg ) by mouth once a day  . CALCIUM CITRATE PO Take 1 tablet (1000  mg ) by mouth daily  . ciprofloxacin (CIPRO) 500 MG tablet Take 1 tablet (500 mg total) by mouth 2 (two) times daily for 7 days. (Patient not taking: Reported on 01/22/2019)  . ferrous sulfate 325 (65 FE) MG EC tablet Take 325 mg by mouth daily with breakfast.  . hydrALAZINE (APRESOLINE) 25 MG tablet Take 1 tablet (25 mg total) by mouth 3 (three) times daily. (Patient not taking: Reported on 01/22/2019)  . losartan-hydrochlorothiazide (HYZAAR) 100-12.5 MG tablet TAKE 1 TABLET BY MOUTH EVERY DAY (Patient not taking: Reported on 01/08/2019)  . nystatin cream (MYCOSTATIN) Apply 1 application topically daily as needed (gluteal crease rash). (Patient not taking: Reported on 01/22/2019)   No facility-administered encounter medications on file as of 01/22/2019.     Review of Systems  Constitutional: Negative for appetite change, chills and fever.  HENT: Positive for tinnitus and trouble swallowing. Negative for congestion, rhinorrhea, sinus pressure, sinus pain, sneezing and sore throat.   Eyes: Negative for discharge, redness and itching.  Respiratory: Negative for cough, chest tightness, shortness of breath and wheezing.   Cardiovascular: Negative for chest pain, palpitations and leg swelling.  Gastrointestinal: Negative for abdominal distention, abdominal pain, constipation, diarrhea, nausea and vomiting.  Neurological: Negative for weakness, light-headedness, numbness and headaches.       Chronic dizziness   Hematological: Does not bruise/bleed easily.  Psychiatric/Behavioral: Negative for agitation and sleep disturbance. The patient is not nervous/anxious.     Immunization History  Administered Date(s) Administered  . Influenza,inj,Quad PF,6+ Mos 02/28/2016  . Pneumococcal Conjugate-13 05/24/2016   Pertinent  Health Maintenance Due  Topic Date Due  . INFLUENZA VACCINE  02/07/2019 (Originally 11/21/2018)  . PNA vac Low Risk Adult (2 of 2 - PPSV23) 06/13/2019 (Originally 05/24/2017)  . DEXA SCAN   Completed   Fall Risk  01/22/2019 01/08/2019 06/16/2018 06/12/2018 12/04/2017  Falls in the past year? 0 0 0 0 No  Number falls in past yr: - - 0 0 -  Injury with Fall? 0 0 0 0 -    Vitals:   01/22/19 1255  BP: (!) 150/96  Pulse: 80  Resp: 20  Temp: 98.5 F (36.9 C)  TempSrc: Oral  SpO2: 98%  Weight: 187 lb 12.8 oz (85.2 kg)  Height: 5' (1.524 m)   Body mass index is 36.68 kg/m. Physical Exam Vitals signs reviewed.  Constitutional:      General: She is not in acute distress.    Appearance: She is obese. She is not ill-appearing.  HENT:     Head: Normocephalic.     Right Ear: Tympanic membrane, ear canal and external ear normal. There is no impacted cerumen.     Left Ear: Tympanic membrane, ear canal and external ear normal. There is no impacted cerumen.     Mouth/Throat:  Mouth: Mucous membranes are moist.     Pharynx: Oropharynx is clear. No oropharyngeal exudate or posterior oropharyngeal erythema.  Eyes:     General:        Right eye: No discharge.        Left eye: No discharge.     Extraocular Movements: Extraocular movements intact.     Conjunctiva/sclera: Conjunctivae normal.     Pupils: Pupils are equal, round, and reactive to light.     Comments: corrective lens in place   Cardiovascular:     Rate and Rhythm: Normal rate.  Neurological:     Mental Status: She is alert.    Labs reviewed: Recent Labs    06/01/18 1329 01/08/19 1417  NA 140 139  K 4.3 3.8  CL 102 104  CO2 28 25  GLUCOSE 97 109  BUN 12 10  CREATININE 0.72 0.68  CALCIUM 10.2 9.6   Recent Labs    06/01/18 1329 01/08/19 1417  AST 19 20  ALT 15 17  BILITOT 0.3 0.3  PROT 7.5 7.5   Recent Labs    06/01/18 1329 01/08/19 1417  WBC 11.2* 11.2*  NEUTROABS 8,266* 8,310*  HGB 12.4 11.5*  HCT 39.9 36.7  MCV 72.4* 72.5*  PLT 374 365   Lab Results  Component Value Date   TSH 0.93 01/08/2019   Lab Results  Component Value Date   HGBA1C 5.9 (H) 01/08/2019   Lab Results   Component Value Date   CHOL 181 01/08/2019   HDL 49 (L) 01/08/2019   LDLCALC 95 01/08/2019   TRIG 258 (H) 01/08/2019   CHOLHDL 3.7 01/08/2019    Significant Diagnostic Results in last 30 days:  No results found.  Assessment/Plan 1. Essential hypertension B/p has improved from previous visit.No home log for review.encouraged to check blood pressure at home and bring to visit.Not taking hydralazine as directed.unclear if taking her medication sometimes.  - CBC with Differential/Platelet; Future - CMP with eGFR([facility]); Future - TSH; Future - Lipid panel; Future  2. Postmenopausal estrogen deficiency No recent falls or fractures.previous Dexa scan left femur neck T-score -2.4 ( 03/22/2016). - DG Bone Density; Future  3. Tinnitus of right ear No signs of infections noted. - Ambulatory referral to ENT  4. Primary osteoarthritis involving multiple joints Not taking ibuprofen will change to as needed.Status post right hip cortisol injection with much improvement.encouraged to use tylenol PRN  - ibuprofen (ADVIL) 800 MG tablet; Take 1 tablet (800 mg total) by mouth 2 (two) times daily as needed.  Dispense: 30 tablet; Refill: 0  5. Acute cystitis without hematuria Recent Urine analysis positive for nitrites with urine culture showed > 100,000 colonies of E.coli. Cipro 500 mg tablet twice daily x 7 days along with Florastor 250 mg capsule already send to pharmacy but has not picked it up as directed states waiting for pharmacist to call her.i've instructed her to go pick up her medication does not need to wait for the pharmacist to call. She verbalized understanding.   6. Oropharyngeal dysphagia Pills get stuck in the throat.discussed with her option for speech therapy and barium swallow to evaluate her symptoms but states will make appointment with her GI who did endoscopy and colonoscopy.Ecouraged to cut pills or crush and swallow in apple sauce or yorgurt.     Family/ staff  Communication: Reviewed plan of care with patient.  Labs/tests ordered:   - CBC with Differential/Platelet; Future - CMP with eGFR([facility]); Future - TSH; Future - Lipid  panel; Future - DG Bone Density; Future  Next visit : 6 months for medical management of chronic issues labs one week prior to visit.   Sandrea Hughs, NP

## 2019-01-30 ENCOUNTER — Other Ambulatory Visit: Payer: Self-pay | Admitting: Family

## 2019-01-30 DIAGNOSIS — I10 Essential (primary) hypertension: Secondary | ICD-10-CM

## 2019-02-09 DIAGNOSIS — J310 Chronic rhinitis: Secondary | ICD-10-CM | POA: Diagnosis not present

## 2019-02-09 DIAGNOSIS — H903 Sensorineural hearing loss, bilateral: Secondary | ICD-10-CM | POA: Diagnosis not present

## 2019-02-09 DIAGNOSIS — H9313 Tinnitus, bilateral: Secondary | ICD-10-CM | POA: Diagnosis not present

## 2019-03-02 DIAGNOSIS — M7061 Trochanteric bursitis, right hip: Secondary | ICD-10-CM | POA: Diagnosis not present

## 2019-04-07 ENCOUNTER — Other Ambulatory Visit: Payer: Self-pay | Admitting: Family

## 2019-04-07 DIAGNOSIS — K219 Gastro-esophageal reflux disease without esophagitis: Secondary | ICD-10-CM

## 2019-04-08 ENCOUNTER — Other Ambulatory Visit: Payer: Self-pay

## 2019-04-08 DIAGNOSIS — M810 Age-related osteoporosis without current pathological fracture: Secondary | ICD-10-CM

## 2019-04-08 MED ORDER — IBANDRONATE SODIUM 150 MG PO TABS: ORAL_TABLET | ORAL | 12 refills | Status: DC

## 2019-05-26 IMAGING — CR DG HIP (WITH OR WITHOUT PELVIS) 2-3V*R*
3 series · 3 of 3 positions shown · non-contrast
Comparison: None.

CLINICAL DATA: Right hip pain beginning yesterday.

EXAM:
DG HIP (WITH OR WITHOUT PELVIS) 2-3V RIGHT

[t pelvis a.p.]
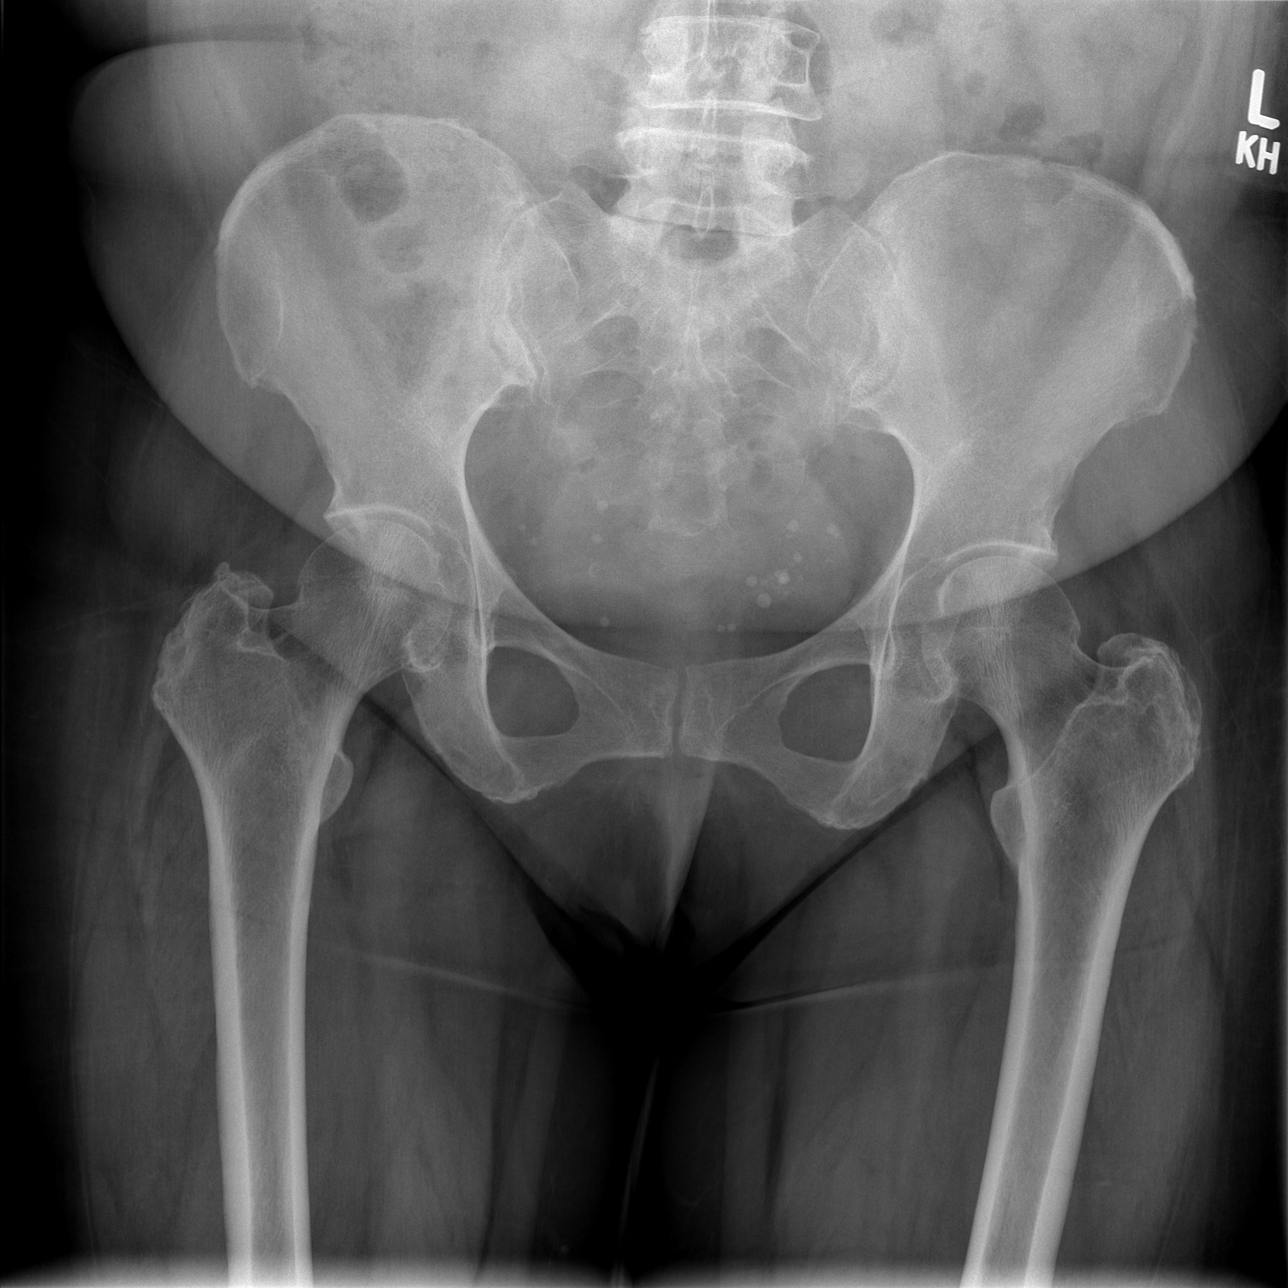

[t hip ap right]
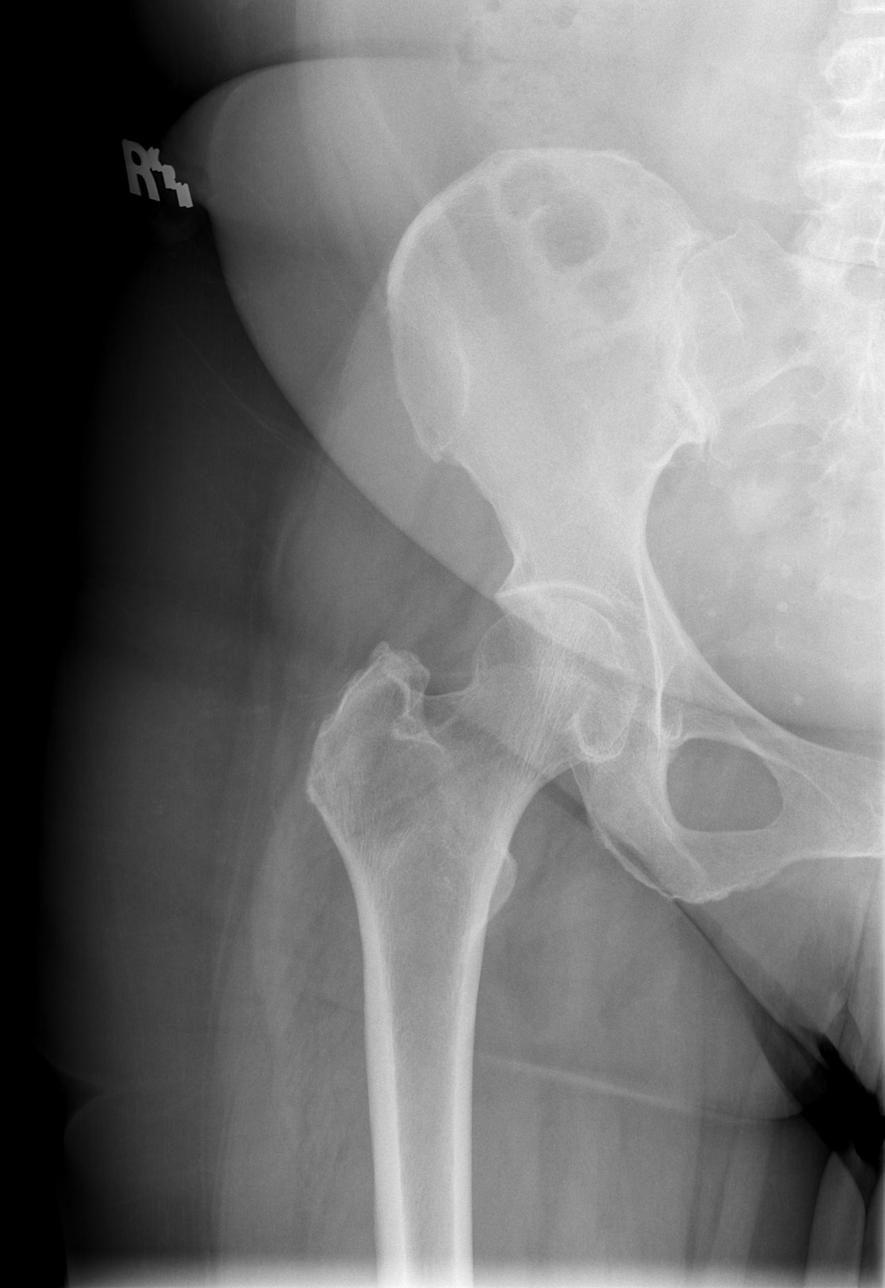

[t hip frog leg right]
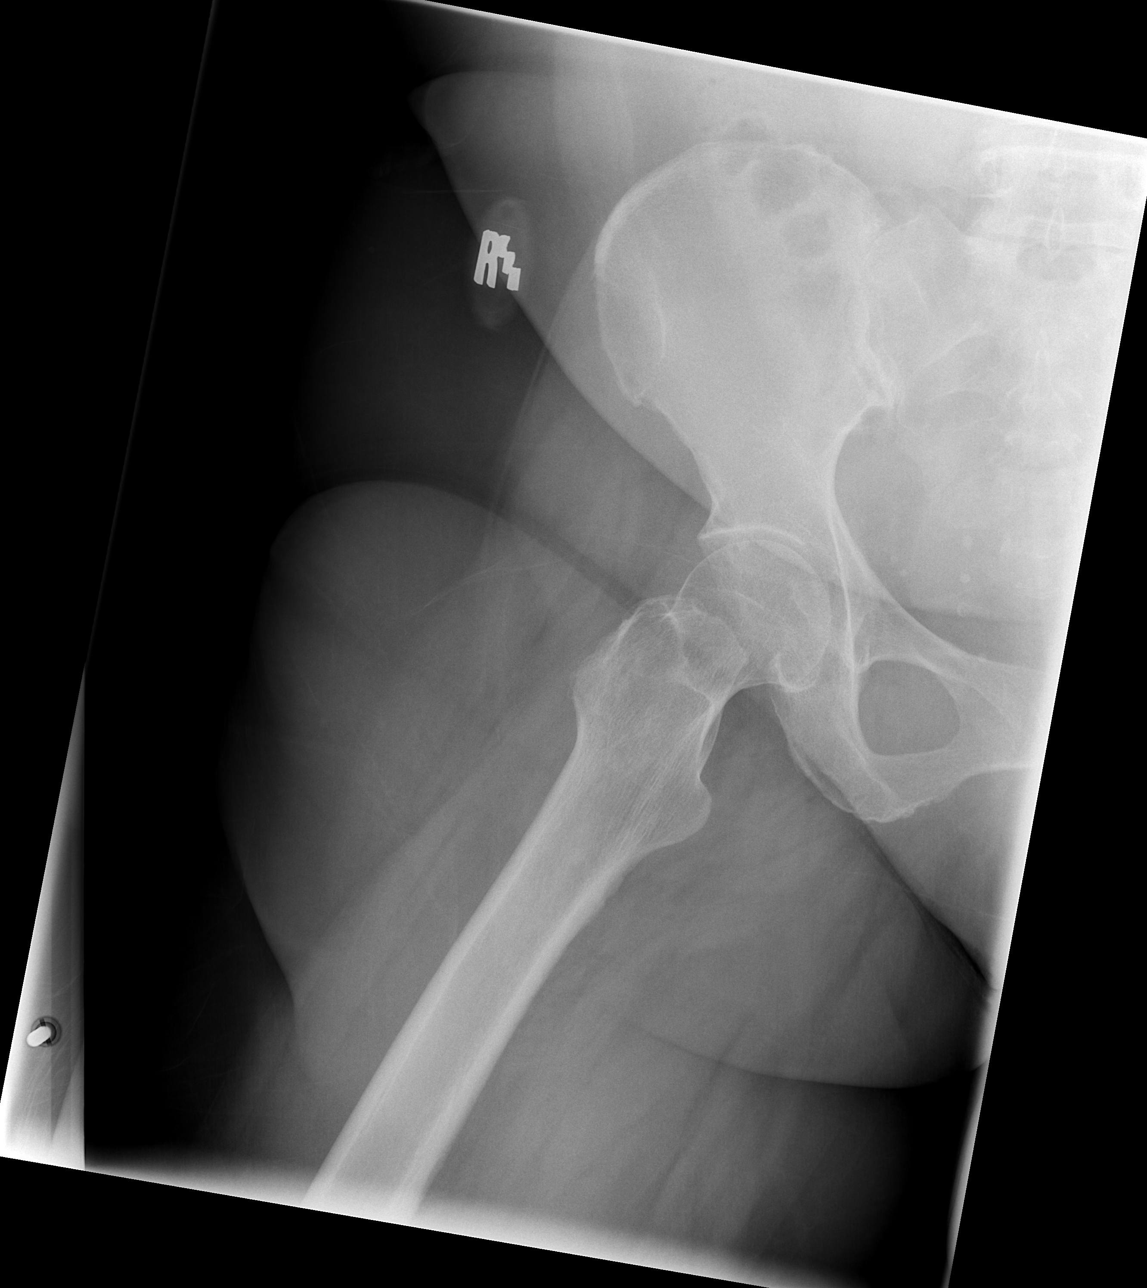

[3 of 3 positions shown; findings below may reference images not displayed]

FINDINGS: I think there is mild degenerative change of the right hip with mild
acetabular sclerosis and possibly a small acetabular cyst laterally.
Other regional structures are normal.
IMPRESSION: Suspicion of mild osteoarthritis of the right hip as described
above. No acute finding otherwise.

## 2019-05-27 DIAGNOSIS — M25551 Pain in right hip: Secondary | ICD-10-CM | POA: Diagnosis not present

## 2019-05-31 ENCOUNTER — Other Ambulatory Visit: Payer: Self-pay | Admitting: Family

## 2019-05-31 DIAGNOSIS — I10 Essential (primary) hypertension: Secondary | ICD-10-CM

## 2019-06-08 DIAGNOSIS — M25551 Pain in right hip: Secondary | ICD-10-CM | POA: Diagnosis not present

## 2019-06-14 ENCOUNTER — Ambulatory Visit (INDEPENDENT_AMBULATORY_CARE_PROVIDER_SITE_OTHER): Payer: Medicare Other | Admitting: Family

## 2019-06-14 ENCOUNTER — Encounter: Payer: Self-pay | Admitting: Family

## 2019-06-14 ENCOUNTER — Other Ambulatory Visit: Payer: Self-pay

## 2019-06-14 DIAGNOSIS — Z0000 Encounter for general adult medical examination without abnormal findings: Secondary | ICD-10-CM

## 2019-06-14 NOTE — Progress Notes (Signed)
Subjective:   Crystal Preston is a 79 y.o. female who presents for Medicare Annual (Subsequent) preventive examination.  Review of Systems:   Cardiac Risk Factors include: advanced age (>73men, >40 women);obesity (BMI >30kg/m2);hypertension;dyslipidemia     Objective:     Vitals: There were no vitals taken for this visit.  There is no height or weight on file to calculate BMI.  Advanced Directives 06/14/2019 01/08/2019 06/16/2018 06/12/2018 06/05/2017 12/06/2016 11/26/2016  Does Patient Have a Medical Advance Directive? No Yes No No No Yes;No No  Would patient like information on creating a medical advance directive? No - Patient declined - No - Patient declined No - Patient declined Yes (MAU/Ambulatory/Procedural Areas - Information given) - No - Patient declined    Tobacco Social History   Tobacco Use  Smoking Status Never Smoker  Smokeless Tobacco Never Used     Counseling given: Not Answered   Clinical Intake:  Pre-visit preparation completed: No  Pain : 0-10 Pain Score: 3  Pain Type: Chronic pain Pain Location: Hip Pain Orientation: Right Pain Radiating Towards: leg Pain Descriptors / Indicators: Pressure Pain Onset: (several years) Pain Frequency: Intermittent Pain Relieving Factors: Rest,cortisol injection Effect of Pain on Daily Activities: Standing,Walking  Pain Relieving Factors: Rest,cortisol injection  BMI - recorded: 36.68 Nutritional Status: BMI > 30  Obese Nutritional Risks: None Diabetes: No  How often do you need to have someone help you when you read instructions, pamphlets, or other written materials from your doctor or pharmacy?: 1 - Never What is the last grade level you completed in school?: 11 Grade  Interpreter Needed?: No  Information entered by :: Jarquavious Fentress FNP-C  Past Medical History:  Diagnosis Date  . GERD (gastroesophageal reflux disease)   . HTN (hypertension)   . Hyperlipidemia   . Insomnia, unspecified   . Obesity,  unspecified   . Osteoarthritis   . Other abnormality of red blood cells    Microcytosis  . Peripheral vascular disease, unspecified ([place])   . Vertigo, benign positional    Head to left side   Past Surgical History:  Procedure Laterality Date  . ABLATION ON ENDOMETRIOSIS  1980  . CHOLECYSTECTOMY, LAPAROSCOPIC  1989  . COLONOSCOPY  2008  . COLONOSCOPY  2013  . TOTAL ABDOMINAL HYSTERECTOMY  1971   Family History  Problem Relation Age of Onset  . Cancer Father 37  . Cancer Mother 83       cervical cancer  . Glaucoma Brother    Social History   Socioeconomic History  . Marital status: Widowed    Spouse name: Not on file  . Number of children: 2  . Years of education: Not on file  . Highest education level: Not on file  Occupational History  . Occupation: Stocker  Tobacco Use  . Smoking status: Never Smoker  . Smokeless tobacco: Never Used  Substance and Sexual Activity  . Alcohol use: No  . Drug use: No  . Sexual activity: Not on file  Other Topics Concern  . Not on file  Social History Narrative   Diet:       Do you drink/ eat things with caffeine? Yes/Coffee      Marital status:  Widowed                             What year were you married ? [id number]      Do you live in a house, apartment,assistred living,  condo, trailer, etc.)?Apt.      Is it one or more stories? 1      How many persons live in your home ? 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   Yes                           Type & how often: Bike/Row 2-5 times a week      Do you have a living will? No      Do you have a DNR form?  No                     If not, do you want to discuss one? No      Do you have signed POA?HPOA forms?  No               If so, please bring to your        appointment   Social Determinants of Health   Financial Resource Strain:   . Difficulty of Paying Living Expenses: Not on  file  Food Insecurity:   . Worried About Programme researcher, broadcasting/film/video in the Last Year: Not on file  . Ran Out of Food in the Last Year: Not on file  Transportation Needs:   . Lack of Transportation (Medical): Not on file  . Lack of Transportation (Non-Medical): Not on file  Physical Activity:   . Days of Exercise per Week: Not on file  . Minutes of Exercise per Session: Not on file  Stress:   . Feeling of Stress : Not on file  Social Connections:   . Frequency of Communication with Friends and Family: Not on file  . Frequency of Social Gatherings with Friends and Family: Not on file  . Attends Religious Services: Not on file  . Active Member of Clubs or Organizations: Not on file  . Attends Banker Meetings: Not on file  . Marital Status: Not on file    Outpatient Encounter Medications as of 06/14/2019  Medication Sig  . aspirin 81 MG tablet Take 81 mg by mouth daily.  Marland Kitchen Bioflavonoid Products (ESTER C PO) Take 1 tablet (1000 mg ) by mouth once a day  . CALCIUM CITRATE PO Take 1 tablet (1000 mg ) by mouth daily  . Cholecalciferol 4000 units TABS Take 1 tablet by mouth daily  . Cod Liver Oil CAPS Take 1 tablet by mouth daily  . hydrALAZINE (APRESOLINE) 25 MG tablet TAKE 1 TABLET BY MOUTH THREE TIMES A DAY  . ibandronate (BONIVA) 150 MG tablet TAKE 1 EVERY 30 DAYS IN THE AM WITH FULL GLASS OF WATER ON EMPTY STOMACH. DO NOT LIE DOWN X30 MINUTE  . ibuprofen (ADVIL) 800 MG tablet Take 1 tablet (800 mg total) by mouth 2 (two) times daily as needed.  Marland Kitchen losartan (COZAAR) 100 MG tablet TAKE 1 TABLET BY MOUTH EVERY DAY  . Naphazoline HCl (CLEAR EYES OP) Apply to eye as needed.   . pantoprazole (PROTONIX) 40 MG tablet TAKE 1 TABLET BY MOUTH DAILY. PLEASE CALL TO SCHEDULE FOLLOW UP APPOINTMENT  . pravastatin (PRAVACHOL) 40 MG tablet TAKE 1 TABLET BY MOUTH EVERY DAY  . [DISCONTINUED] ferrous sulfate 325 (65 FE) MG EC tablet Take 325 mg by mouth daily with breakfast.  . [  DISCONTINUED]  nystatin cream (MYCOSTATIN) Apply 1 application topically daily as needed (gluteal crease rash). (Patient not taking: Reported on 01/22/2019)   No facility-administered encounter medications on file as of 06/14/2019.    Activities of Daily Living In your present state of health, do you have any difficulty performing the following activities: 06/14/2019  Hearing? N  Vision? N  Difficulty concentrating or making decisions? N  Walking or climbing stairs? N  Dressing or bathing? N  Doing errands, shopping? N  Preparing Food and eating ? N  Using the Toilet? N  In the past six months, have you accidently leaked urine? Y  Comment Always wears pad  Do you have problems with loss of bowel control? N  Managing your Medications? N  Managing your Finances? N  Housekeeping or managing your Housekeeping? N  Some recent data might be hidden    Patient Care Team: Brenen Beigel, Donalee Citrin, NP as PCP - General (Family Medicine) Elise Benne, MD as Consulting Physician (Ophthalmology)    Assessment:   This is a routine wellness examination for Jeslynn.  Exercise Activities and Dietary recommendations Current Exercise Habits: Home exercise routine, Type of exercise: strength training/weights;treadmill, Time (Minutes): 45, Frequency (Times/Week): 5, Weekly Exercise (Minutes/Week): 225, Intensity: Moderate, Exercise limited by: None identified  Goals    . <enter goal here>     Starting 05/24/16, I will maintain my current lifestyle.        Fall Risk Fall Risk  06/14/2019 01/22/2019 01/08/2019 06/16/2018 06/12/2018  Falls in the past year? 0 0 0 0 0  Number falls in past yr: 0 - - 0 0  Injury with Fall? 0 0 0 0 0   Is the patient's home free of loose throw rugs in walkways, pet beds, electrical cords, etc?   no      Grab bars in the bathroom? yes      Handrails on the stairs?   no      Adequate lighting?   yes  Depression Screen PHQ 2/9 Scores 06/14/2019 01/22/2019 01/08/2019 06/12/2018  PHQ - 2 Score 0 0  0 0     Cognitive Function MMSE - Mini Mental State Exam 06/12/2018 06/05/2017 05/24/2016  Orientation to time 5 5 5   Orientation to Place 4 5 5   Registration 3 3 3   Attention/ Calculation 5 5 5   Recall 2 1 3   Language- name 2 objects 2 2 2   Language- repeat 1 1 1   Language- follow 3 step command 3 3 2   Language- read & follow direction 1 1 1   Write a sentence 1 1 1   Copy design 1 0 0  Total score 28 27 28      6CIT Screen 06/14/2019  What Year? 0 points  What month? 0 points  What time? 0 points  Count back from 20 0 points  Months in reverse 0 points  Repeat phrase 0 points  Total Score 0    Immunization History  Administered Date(s) Administered  . Influenza,inj,Quad PF,6+ Mos 02/28/2016  . Pneumococcal Conjugate-13 05/24/2016    Qualifies for Shingles Vaccine? Declined   Screening Tests Health Maintenance  Topic Date Due  . TETANUS/TDAP  11/02/1958  . PNA vac Low Risk Adult (2 of 2 - PPSV23) 05/24/2017  . INFLUENZA VACCINE  11/21/2018  . DEXA SCAN  Completed    Cancer Screenings: Lung: Low Dose CT Chest recommended if Age 40-80 years, 30 pack-year currently smoking OR have quit w/in 15years. Patient does not qualify. Breast:  Up to date on Mammogram? Yes   Up to date of Bone Density/Dexa? Yes Colorectal:Aged out   Additional Screenings: Hepatitis C Screening: Low Risk      Plan:   -Declined Shingrix,Tdap,Pneumoccocal  And Influenza vaccine.   I have personally reviewed and noted the following in the patient's chart:   . Medical and social history . Use of alcohol, tobacco or illicit drugs  . Current medications and supplements . Functional ability and status . Nutritional status . Physical activity . Advanced directives . List of other physicians . Hospitalizations, surgeries, and ER visits in previous 12 months . Vitals . Screenings to include cognitive, depression, and falls . Referrals and appointments  In addition, I have reviewed and  discussed with patient certain preventive protocols, quality metrics, and best practice recommendations. A written personalized care plan for preventive services as well as general preventive health recommendations were provided to patient.    Caesar Bookman, NP  06/14/2019

## 2019-06-14 NOTE — Patient Instructions (Signed)
Crystal Preston , Thank you for taking time to come for your Medicare Wellness Visit. I appreciate your ongoing commitment to your health goals. Please review the following plan we discussed and let me know if I can assist you in the future.   Screening recommendations/referrals: Colonoscopy: Aged out  Mammogram: Up to date  Bone Density : Up to date  Recommended yearly ophthalmology/optometry visit for glaucoma screening and checkup Recommended yearly dental visit for hygiene and checkup  Vaccinations: Influenza vaccine: Declined  Pneumococcal vaccine : Declined  Tdap vaccine : Declined  Shingles vaccine : Declined    Advanced directives: No   Conditions/risks identified: Advanced Age female > 65 yrs,Obesity BMI > 30,Hypertension,Dyslipidemia   Next appointment: 1 year     Preventive Care 34 Years and Older, Female Preventive care refers to lifestyle choices and visits with your health care provider that can promote health and wellness. What does preventive care include?  A yearly physical exam. This is also called an annual well check.  Dental exams once or twice a year.  Routine eye exams. Ask your health care provider how often you should have your eyes checked.  Personal lifestyle choices, including:  Daily care of your teeth and gums.  Regular physical activity.  Eating a healthy diet.  Avoiding tobacco and drug use.  Limiting alcohol use.  Practicing safe sex.  Taking low-dose aspirin every day.  Taking vitamin and mineral supplements as recommended by your health care provider. What happens during an annual well check? The services and screenings done by your health care provider during your annual well check will depend on your age, overall health, lifestyle risk factors, and family history of disease. Counseling  Your health care provider may ask you questions about your:  Alcohol use.  Tobacco use.  Drug use.  Emotional well-being.  Home and  relationship well-being.  Sexual activity.  Eating habits.  History of falls.  Memory and ability to understand (cognition).  Work and work Astronomer.  Reproductive health. Screening  You may have the following tests or measurements:  Height, weight, and BMI.  Blood pressure.  Lipid and cholesterol levels. These may be checked every 5 years, or more frequently if you are over 3 years old.  Skin check.  Lung cancer screening. You may have this screening every year starting at age 57 if you have a 30-pack-year history of smoking and currently smoke or have quit within the past 15 years.  Fecal occult blood test (FOBT) of the stool. You may have this test every year starting at age 30.  Flexible sigmoidoscopy or colonoscopy. You may have a sigmoidoscopy every 5 years or a colonoscopy every 10 years starting at age 29.  Hepatitis C blood test.  Hepatitis B blood test.  Sexually transmitted disease (STD) testing.  Diabetes screening. This is done by checking your blood sugar (glucose) after you have not eaten for a while (fasting). You may have this done every 1-3 years.  Bone density scan. This is done to screen for osteoporosis. You may have this done starting at age 74.  Mammogram. This may be done every 1-2 years. Talk to your health care provider about how often you should have regular mammograms. Talk with your health care provider about your test results, treatment options, and if necessary, the need for more tests. Vaccines  Your health care provider may recommend certain vaccines, such as:  Influenza vaccine. This is recommended every year.  Tetanus, diphtheria, and acellular pertussis (Tdap,  Td) vaccine. You may need a Td booster every 10 years.  Zoster vaccine. You may need this after age 31.  Pneumococcal 13-valent conjugate (PCV13) vaccine. One dose is recommended after age 13.  Pneumococcal polysaccharide (PPSV23) vaccine. One dose is recommended after  age 49. Talk to your health care provider about which screenings and vaccines you need and how often you need them. This information is not intended to replace advice given to you by your health care provider. Make sure you discuss any questions you have with your health care provider. Document Released: 05/05/2015 Document Revised: 12/27/2015 Document Reviewed: 02/07/2015 Elsevier Interactive Patient Education  2017 [place] Prevention in the Home Falls can cause injuries. They can happen to people of all ages. There are many things you can do to make your home safe and to help prevent falls. What can I do on the outside of my home?  Regularly fix the edges of walkways and driveways and fix any cracks.  Remove anything that might make you trip as you walk through a door, such as a raised step or threshold.  Trim any bushes or trees on the path to your home.  Use bright outdoor lighting.  Clear any walking paths of anything that might make someone trip, such as rocks or tools.  Regularly check to see if handrails are loose or broken. Make sure that both sides of any steps have handrails.  Any raised decks and porches should have guardrails on the edges.  Have any leaves, snow, or ice cleared regularly.  Use sand or salt on walking paths during winter.  Clean up any spills in your garage right away. This includes oil or grease spills. What can I do in the bathroom?  Use night lights.  Install grab bars by the toilet and in the tub and shower. Do not use towel bars as grab bars.  Use non-skid mats or decals in the tub or shower.  If you need to sit down in the shower, use a plastic, non-slip stool.  Keep the floor dry. Clean up any water that spills on the floor as soon as it happens.  Remove soap buildup in the tub or shower regularly.  Attach bath mats securely with double-sided non-slip rug tape.  Do not have throw rugs and other things on the floor that can  make you trip. What can I do in the bedroom?  Use night lights.  Make sure that you have a light by your bed that is easy to reach.  Do not use any sheets or blankets that are too big for your bed. They should not hang down onto the floor.  Have a firm chair that has side arms. You can use this for support while you get dressed.  Do not have throw rugs and other things on the floor that can make you trip. What can I do in the kitchen?  Clean up any spills right away.  Avoid walking on wet floors.  Keep items that you use a lot in easy-to-reach places.  If you need to reach something above you, use a strong step stool that has a grab bar.  Keep electrical cords out of the way.  Do not use floor polish or wax that makes floors slippery. If you must use wax, use non-skid floor wax.  Do not have throw rugs and other things on the floor that can make you trip. What can I do with my stairs?  Do not  leave any items on the stairs.  Make sure that there are handrails on both sides of the stairs and use them. Fix handrails that are broken or loose. Make sure that handrails are as long as the stairways.  Check any carpeting to make sure that it is firmly attached to the stairs. Fix any carpet that is loose or worn.  Avoid having throw rugs at the top or bottom of the stairs. If you do have throw rugs, attach them to the floor with carpet tape.  Make sure that you have a light switch at the top of the stairs and the bottom of the stairs. If you do not have them, ask someone to add them for you. What else can I do to help prevent falls?  Wear shoes that:  Do not have high heels.  Have rubber bottoms.  Are comfortable and fit you well.  Are closed at the toe. Do not wear sandals.  If you use a stepladder:  Make sure that it is fully opened. Do not climb a closed stepladder.  Make sure that both sides of the stepladder are locked into place.  Ask someone to hold it for you,  if possible.  Clearly mark and make sure that you can see:  Any grab bars or handrails.  First and last steps.  Where the edge of each step is.  Use tools that help you move around (mobility aids) if they are needed. These include:  Canes.  Walkers.  Scooters.  Crutches.  Turn on the lights when you go into a dark area. Replace any light bulbs as soon as they burn out.  Set up your furniture so you have a clear path. Avoid moving your furniture around.  If any of your floors are uneven, fix them.  If there are any pets around you, be aware of where they are.  Review your medicines with your doctor. Some medicines can make you feel dizzy. This can increase your chance of falling. Ask your doctor what other things that you can do to help prevent falls. This information is not intended to replace advice given to you by your health care provider. Make sure you discuss any questions you have with your health care provider. Document Released: 02/02/2009 Document Revised: 09/14/2015 Document Reviewed: 05/13/2014 Elsevier Interactive Patient Education  2017 [employer].

## 2019-06-14 NOTE — Progress Notes (Signed)
Patient ID: Crystal Preston, female   DOB: 03/16/1941, 79 y.o.   MRN: [id number] This service is provided via telemedicine  No vital signs collected/recorded due to the encounter was a telemedicine visit.   Location of patient (ex: home, work):  HOME  Patient consents to a telephone visit:  YES  Location of the provider (ex: office, home):  OFFICE  Name of any referring provider:  [facility] [name], NP  Names of all persons participating in the telemedicine service and their role in the encounter:  PATIENT, Mervin Hack, CMA, [facility] [name], NP  Time spent on call:  9:22

## 2019-06-18 ENCOUNTER — Ambulatory Visit: Payer: Medicare Other

## 2019-06-18 ENCOUNTER — Ambulatory Visit: Payer: Medicare Other | Attending: Internal Medicine

## 2019-06-18 DIAGNOSIS — Z23 Encounter for immunization: Secondary | ICD-10-CM | POA: Insufficient documentation

## 2019-06-18 NOTE — Progress Notes (Signed)
   Covid-19 Vaccination Clinic  Name:  Crystal Preston    MRN: [id number] DOB: Mar 30, 1941  06/18/2019  [name] was observed post Covid-19 immunization for 15 minutes without incidence. She was provided with Vaccine Information Sheet and instruction to access the V-Safe system.   [name] was instructed to call 911 with any severe reactions post vaccine: Marland Kitchen Difficulty breathing  . Swelling of your face and throat  . A fast heartbeat  . A bad rash all over your body  . Dizziness and weakness    Immunizations Administered    Name Date Dose VIS Date Route   Pfizer COVID-19 Vaccine 06/18/2019 12:52 PM 0.3 mL 04/02/2019 Intramuscular   Manufacturer: [employer], [employer]   Lot: [id number]   NDC: [address]-9

## 2019-07-02 ENCOUNTER — Other Ambulatory Visit: Payer: Self-pay | Admitting: Family

## 2019-07-02 DIAGNOSIS — K219 Gastro-esophageal reflux disease without esophagitis: Secondary | ICD-10-CM

## 2019-07-05 ENCOUNTER — Other Ambulatory Visit: Payer: Self-pay | Admitting: Sports Medicine

## 2019-07-05 DIAGNOSIS — M25551 Pain in right hip: Secondary | ICD-10-CM | POA: Diagnosis not present

## 2019-07-05 DIAGNOSIS — M25552 Pain in left hip: Secondary | ICD-10-CM | POA: Diagnosis not present

## 2019-07-05 DIAGNOSIS — M545 Low back pain, unspecified: Secondary | ICD-10-CM

## 2019-07-12 ENCOUNTER — Other Ambulatory Visit: Payer: Self-pay | Admitting: Family

## 2019-07-13 ENCOUNTER — Ambulatory Visit: Payer: Medicare Other | Attending: Internal Medicine

## 2019-07-13 DIAGNOSIS — Z23 Encounter for immunization: Secondary | ICD-10-CM

## 2019-07-13 NOTE — Progress Notes (Signed)
   Covid-19 Vaccination Clinic  Name:  RONNETTE RUMP    MRN: [id number] DOB: 1940-05-28  07/13/2019  Ms. Esquivel was observed post Covid-19 immunization for 15 minutes without incident. She was provided with Vaccine Information Sheet and instruction to access the V-Safe system.   Ms. Silverio was instructed to call 911 with any severe reactions post vaccine: Marland Kitchen Difficulty breathing  . Swelling of face and throat  . A fast heartbeat  . A bad rash all over body  . Dizziness and weakness   Immunizations Administered    Name Date Dose VIS Date Route   Pfizer COVID-19 Vaccine 07/13/2019  3:45 PM 0.3 mL 04/02/2019 Intramuscular   Manufacturer: [employer], [employer]   Lot: [id number]   NDC: [address]-6

## 2019-07-16 ENCOUNTER — Other Ambulatory Visit: Payer: Medicare Other

## 2019-07-16 ENCOUNTER — Other Ambulatory Visit: Payer: Self-pay

## 2019-07-16 DIAGNOSIS — I10 Essential (primary) hypertension: Secondary | ICD-10-CM

## 2019-07-17 LAB — COMPLETE METABOLIC PANEL WITH GFR
AG Ratio: 1.6 (calc) (ref 1.0–2.5)
ALT: 18 U/L (ref 6–29)
AST: 20 U/L (ref 10–35)
Albumin: 4.6 g/dL (ref 3.6–5.1)
Alkaline phosphatase (APISO): 52 U/L (ref 37–153)
BUN: 12 mg/dL (ref 7–25)
CO2: 24 mmol/L (ref 20–32)
Calcium: 9.6 mg/dL (ref 8.6–10.4)
Chloride: 103 mmol/L (ref 98–110)
Creat: 0.69 mg/dL (ref 0.60–0.93)
GFR, Est African American: 96 mL/min/{1.73_m2} (ref >=60)
GFR, Est Non African American: 83 mL/min/{1.73_m2} (ref >=60)
Globulin: 2.8 g/dL (calc) (ref 1.9–3.7)
Glucose, Bld: 119 mg/dL (ref 65–139)
Potassium: 3.9 mmol/L (ref 3.5–5.3)
Sodium: 138 mmol/L (ref 135–146)
Total Bilirubin: 0.2 mg/dL (ref 0.2–1.2)
Total Protein: 7.4 g/dL (ref 6.1–8.1)

## 2019-07-17 LAB — LIPID PANEL
Cholesterol: 196 mg/dL (ref <200)
HDL: 50 mg/dL (ref >=50)
LDL Cholesterol (Calc): 116 mg/dL (calc) — ABNORMAL HIGH
Non-HDL Cholesterol (Calc): 146 mg/dL (calc) — ABNORMAL HIGH (ref <130)
Total CHOL/HDL Ratio: 3.9 (calc) (ref <5.0)
Triglycerides: 178 mg/dL — ABNORMAL HIGH (ref <150)

## 2019-07-17 LAB — CBC WITH DIFFERENTIAL/PLATELET
Absolute Monocytes: 723 cells/uL (ref 200–950)
Basophils Absolute: 17 cells/uL (ref 0–200)
Basophils Relative: 0.2 %
Eosinophils Absolute: 221 cells/uL (ref 15–500)
Eosinophils Relative: 2.6 %
HCT: 38.8 % (ref 35.0–45.0)
Hemoglobin: 11.8 g/dL (ref 11.7–15.5)
Lymphs Abs: 2108 cells/uL (ref 850–3900)
MCH: 22.3 pg — ABNORMAL LOW (ref 27.0–33.0)
MCHC: 30.4 g/dL — ABNORMAL LOW (ref 32.0–36.0)
MCV: 73.3 fL — ABNORMAL LOW (ref 80.0–100.0)
MPV: 10 fL (ref 7.5–12.5)
Monocytes Relative: 8.5 %
Neutro Abs: 5432 cells/uL (ref 1500–7800)
Neutrophils Relative %: 63.9 %
Platelets: 370 10*3/uL (ref 140–400)
RBC: 5.29 10*6/uL — ABNORMAL HIGH (ref 3.80–5.10)
RDW: 14.2 % (ref 11.0–15.0)
Total Lymphocyte: 24.8 %
WBC: 8.5 10*3/uL (ref 3.8–10.8)

## 2019-07-17 LAB — TSH: TSH: 0.9 mIU/L (ref 0.40–4.50)

## 2019-07-23 ENCOUNTER — Ambulatory Visit: Payer: Medicare Other | Admitting: Family

## 2019-07-24 ENCOUNTER — Other Ambulatory Visit: Payer: Self-pay | Admitting: Family

## 2019-07-24 DIAGNOSIS — I10 Essential (primary) hypertension: Secondary | ICD-10-CM

## 2019-07-26 ENCOUNTER — Other Ambulatory Visit: Payer: Self-pay

## 2019-07-26 DIAGNOSIS — K219 Gastro-esophageal reflux disease without esophagitis: Secondary | ICD-10-CM

## 2019-07-26 MED ORDER — PANTOPRAZOLE SODIUM 40 MG PO TBEC: DELAYED_RELEASE_TABLET | ORAL | 1 refills | Status: DC

## 2019-07-26 NOTE — Telephone Encounter (Signed)
Patient called and requested refill on medication "Pantoprazole". Patient last visit was 01/22/2019. Patient has upcoming appointment tomorrow 07/30/2019. Medication pend and sent to PCP [name], Dinah C, NP  if changes need to be made. Please Advise.

## 2019-07-27 ENCOUNTER — Ambulatory Visit
Admission: RE | Admit: 2019-07-27 | Discharge: 2019-07-27 | Disposition: A | Discharge status: Home / Self Care | Payer: Medicare Other | Source: Ambulatory Visit | Attending: Sports Medicine | Admitting: Sports Medicine

## 2019-07-27 DIAGNOSIS — M545 Low back pain, unspecified: Secondary | ICD-10-CM

## 2019-07-27 DIAGNOSIS — M48061 Spinal stenosis, lumbar region without neurogenic claudication: Secondary | ICD-10-CM | POA: Diagnosis not present

## 2019-07-29 ENCOUNTER — Other Ambulatory Visit: Payer: Self-pay | Admitting: Sports Medicine

## 2019-07-29 DIAGNOSIS — M47816 Spondylosis without myelopathy or radiculopathy, lumbar region: Secondary | ICD-10-CM | POA: Diagnosis not present

## 2019-07-29 DIAGNOSIS — M48061 Spinal stenosis, lumbar region without neurogenic claudication: Secondary | ICD-10-CM

## 2019-07-30 ENCOUNTER — Ambulatory Visit: Payer: Medicare Other | Admitting: Family

## 2019-08-04 ENCOUNTER — Ambulatory Visit
Admission: RE | Admit: 2019-08-04 | Discharge: 2019-08-04 | Disposition: A | Discharge status: Home / Self Care | Payer: Medicare Other | Source: Ambulatory Visit | Attending: Sports Medicine | Admitting: Sports Medicine

## 2019-08-04 ENCOUNTER — Other Ambulatory Visit: Payer: Self-pay

## 2019-08-04 DIAGNOSIS — M48061 Spinal stenosis, lumbar region without neurogenic claudication: Secondary | ICD-10-CM | POA: Diagnosis not present

## 2019-08-04 MED ORDER — METHYLPREDNISOLONE ACETATE 40 MG/ML INJ SUSP (RADIOLOG: 120.0000 mg | Freq: Once | INTRAMUSCULAR | Status: DC

## 2019-08-04 MED ORDER — IOPAMIDOL (ISOVUE-M 200) INJECTION 41%: 1.0000 mL | Freq: Once | INTRAMUSCULAR | Status: DC

## 2019-08-04 MED ORDER — IOPAMIDOL (ISOVUE-M 200) INJECTION 41%
1.0000 mL | Freq: Once | INTRAMUSCULAR | Status: AC
  Administered 2019-08-04: 1 mL via EPIDURAL

## 2019-08-04 MED ORDER — METHYLPREDNISOLONE ACETATE 40 MG/ML INJ SUSP (RADIOLOG
120.0000 mg | Freq: Once | INTRAMUSCULAR | Status: AC
  Administered 2019-08-04: 120 mg via EPIDURAL

## 2019-08-04 NOTE — Discharge Instructions (Signed)

## 2019-09-10 DIAGNOSIS — M48061 Spinal stenosis, lumbar region without neurogenic claudication: Secondary | ICD-10-CM | POA: Diagnosis not present

## 2019-09-13 ENCOUNTER — Other Ambulatory Visit: Payer: Self-pay | Admitting: Sports Medicine

## 2019-09-13 DIAGNOSIS — M545 Low back pain, unspecified: Secondary | ICD-10-CM

## 2019-09-13 DIAGNOSIS — M48061 Spinal stenosis, lumbar region without neurogenic claudication: Secondary | ICD-10-CM

## 2019-09-17 ENCOUNTER — Ambulatory Visit
Admission: RE | Admit: 2019-09-17 | Discharge: 2019-09-17 | Disposition: A | Discharge status: Home / Self Care | Payer: Medicare Other | Source: Ambulatory Visit | Attending: Sports Medicine | Admitting: Sports Medicine

## 2019-09-17 ENCOUNTER — Other Ambulatory Visit: Payer: Self-pay

## 2019-09-17 DIAGNOSIS — M48061 Spinal stenosis, lumbar region without neurogenic claudication: Secondary | ICD-10-CM

## 2019-09-17 DIAGNOSIS — G8929 Other chronic pain: Secondary | ICD-10-CM

## 2019-09-17 DIAGNOSIS — H2513 Age-related nuclear cataract, bilateral: Secondary | ICD-10-CM | POA: Diagnosis not present

## 2019-09-17 DIAGNOSIS — H40033 Anatomical narrow angle, bilateral: Secondary | ICD-10-CM | POA: Diagnosis not present

## 2019-09-17 MED ORDER — METHYLPREDNISOLONE ACETATE 40 MG/ML INJ SUSP (RADIOLOG
120.0000 mg | Freq: Once | INTRAMUSCULAR | Status: AC
  Administered 2019-09-17: 120 mg via EPIDURAL

## 2019-09-17 MED ORDER — IOPAMIDOL (ISOVUE-M 200) INJECTION 41%
1.0000 mL | Freq: Once | INTRAMUSCULAR | Status: AC
  Administered 2019-09-17: 1 mL via EPIDURAL

## 2019-09-17 NOTE — Discharge Instructions (Signed)

## 2019-12-05 ENCOUNTER — Other Ambulatory Visit: Payer: Self-pay | Admitting: Family

## 2019-12-05 DIAGNOSIS — I10 Essential (primary) hypertension: Secondary | ICD-10-CM

## 2019-12-10 DIAGNOSIS — M545 Low back pain: Secondary | ICD-10-CM | POA: Diagnosis not present

## 2019-12-14 ENCOUNTER — Other Ambulatory Visit: Payer: Self-pay | Admitting: Sports Medicine

## 2019-12-14 DIAGNOSIS — G8929 Other chronic pain: Secondary | ICD-10-CM

## 2019-12-14 DIAGNOSIS — M48061 Spinal stenosis, lumbar region without neurogenic claudication: Secondary | ICD-10-CM

## 2019-12-17 ENCOUNTER — Ambulatory Visit
Admission: RE | Admit: 2019-12-17 | Discharge: 2019-12-17 | Disposition: A | Discharge status: Home / Self Care | Payer: Medicare Other | Source: Ambulatory Visit | Attending: Sports Medicine | Admitting: Sports Medicine

## 2019-12-17 ENCOUNTER — Other Ambulatory Visit: Payer: Self-pay

## 2019-12-17 DIAGNOSIS — M48061 Spinal stenosis, lumbar region without neurogenic claudication: Secondary | ICD-10-CM

## 2019-12-17 DIAGNOSIS — M5116 Intervertebral disc disorders with radiculopathy, lumbar region: Secondary | ICD-10-CM | POA: Diagnosis not present

## 2019-12-17 DIAGNOSIS — M545 Low back pain, unspecified: Secondary | ICD-10-CM

## 2019-12-17 MED ORDER — IOPAMIDOL (ISOVUE-M 200) INJECTION 41%
1.0000 mL | Freq: Once | INTRAMUSCULAR | Status: AC
  Administered 2019-12-17: 1 mL via EPIDURAL

## 2019-12-17 MED ORDER — METHYLPREDNISOLONE ACETATE 40 MG/ML INJ SUSP (RADIOLOG
120.0000 mg | Freq: Once | INTRAMUSCULAR | Status: AC
  Administered 2019-12-17: 120 mg via EPIDURAL

## 2019-12-17 NOTE — Discharge Instructions (Signed)

## 2020-01-28 ENCOUNTER — Other Ambulatory Visit: Payer: Self-pay | Admitting: Family

## 2020-02-20 ENCOUNTER — Other Ambulatory Visit: Payer: Self-pay | Admitting: Family

## 2020-02-20 DIAGNOSIS — M810 Age-related osteoporosis without current pathological fracture: Secondary | ICD-10-CM

## 2020-02-29 DIAGNOSIS — H52203 Unspecified astigmatism, bilateral: Secondary | ICD-10-CM | POA: Diagnosis not present

## 2020-02-29 DIAGNOSIS — H25813 Combined forms of age-related cataract, bilateral: Secondary | ICD-10-CM | POA: Diagnosis not present

## 2020-03-07 ENCOUNTER — Encounter: Payer: Self-pay | Admitting: Family

## 2020-03-07 DIAGNOSIS — Z1231 Encounter for screening mammogram for malignant neoplasm of breast: Secondary | ICD-10-CM | POA: Diagnosis not present

## 2020-03-14 ENCOUNTER — Other Ambulatory Visit (INDEPENDENT_AMBULATORY_CARE_PROVIDER_SITE_OTHER): Payer: Self-pay | Admitting: Otolaryngology

## 2020-04-12 ENCOUNTER — Other Ambulatory Visit: Payer: Self-pay | Admitting: Family

## 2020-04-12 DIAGNOSIS — K219 Gastro-esophageal reflux disease without esophagitis: Secondary | ICD-10-CM

## 2020-04-29 ENCOUNTER — Other Ambulatory Visit: Payer: Self-pay | Admitting: Family

## 2020-04-29 DIAGNOSIS — M810 Age-related osteoporosis without current pathological fracture: Secondary | ICD-10-CM

## 2020-05-01 ENCOUNTER — Other Ambulatory Visit: Payer: Self-pay | Admitting: Sports Medicine

## 2020-05-01 DIAGNOSIS — M48061 Spinal stenosis, lumbar region without neurogenic claudication: Secondary | ICD-10-CM | POA: Diagnosis not present

## 2020-05-03 ENCOUNTER — Ambulatory Visit
Admission: RE | Admit: 2020-05-03 | Discharge: 2020-05-03 | Disposition: A | Discharge status: Home / Self Care | Payer: Medicare Other | Source: Ambulatory Visit | Attending: Sports Medicine | Admitting: Sports Medicine

## 2020-05-03 ENCOUNTER — Other Ambulatory Visit: Payer: Self-pay

## 2020-05-03 DIAGNOSIS — M47817 Spondylosis without myelopathy or radiculopathy, lumbosacral region: Secondary | ICD-10-CM | POA: Diagnosis not present

## 2020-05-03 DIAGNOSIS — M48061 Spinal stenosis, lumbar region without neurogenic claudication: Secondary | ICD-10-CM

## 2020-05-03 MED ORDER — IOPAMIDOL (ISOVUE-M 200) INJECTION 41%
1.0000 mL | Freq: Once | INTRAMUSCULAR | Status: AC
  Administered 2020-05-03: 1 mL via EPIDURAL

## 2020-05-03 MED ORDER — METHYLPREDNISOLONE ACETATE 40 MG/ML INJ SUSP (RADIOLOG
120.0000 mg | Freq: Once | INTRAMUSCULAR | Status: AC
  Administered 2020-05-03: 120 mg via EPIDURAL

## 2020-05-03 NOTE — Discharge Instructions (Signed)

## 2020-05-04 ENCOUNTER — Other Ambulatory Visit: Payer: Medicare Other

## 2020-05-05 ENCOUNTER — Other Ambulatory Visit: Payer: Self-pay | Admitting: Family

## 2020-05-05 DIAGNOSIS — K219 Gastro-esophageal reflux disease without esophagitis: Secondary | ICD-10-CM

## 2020-05-30 ENCOUNTER — Other Ambulatory Visit: Payer: Self-pay | Admitting: Family

## 2020-05-30 DIAGNOSIS — K219 Gastro-esophageal reflux disease without esophagitis: Secondary | ICD-10-CM

## 2020-06-15 ENCOUNTER — Encounter: Payer: Medicare Other | Admitting: Family

## 2020-06-22 ENCOUNTER — Other Ambulatory Visit: Payer: Self-pay

## 2020-06-22 ENCOUNTER — Ambulatory Visit (INDEPENDENT_AMBULATORY_CARE_PROVIDER_SITE_OTHER): Payer: Medicare Other | Admitting: Family

## 2020-06-22 ENCOUNTER — Encounter: Payer: Self-pay | Admitting: Family

## 2020-06-22 DIAGNOSIS — Z0000 Encounter for general adult medical examination without abnormal findings: Secondary | ICD-10-CM | POA: Diagnosis not present

## 2020-06-22 NOTE — Patient Instructions (Signed)
Crystal Preston , Thank you for taking time to come for your Medicare Wellness Visit. I appreciate your ongoing commitment to your health goals. Please review the following plan we discussed and let me know if I can assist you in the future.   Screening recommendations/referrals: Colonoscopy: N/A  Mammogram: Has upcoming appointment  Bone Density: Up to date  Recommended yearly ophthalmology/optometry visit for glaucoma screening and checkup Recommended yearly dental visit for hygiene and checkup  Vaccinations: Influenza vaccine: Declined  Pneumococcal vaccine : Declined  Tdap vaccine : Declined  Shingles vaccine : Declined    Advanced directives: No   Conditions/risks identified: Advance age female > 65 yrs old,Hypertension,dyslipidemia,Obesity BMI > 30   Next appointment: 1 year    Preventive Care 106 Years and Older, Female Preventive care refers to lifestyle choices and visits with your health care provider that can promote health and wellness. What does preventive care include?  A yearly physical exam. This is also called an annual well check.  Dental exams once or twice a year.  Routine eye exams. Ask your health care provider how often you should have your eyes checked.  Personal lifestyle choices, including:  Daily care of your teeth and gums.  Regular physical activity.  Eating a healthy diet.  Avoiding tobacco and drug use.  Limiting alcohol use.  Practicing safe sex.  Taking low-dose aspirin every day.  Taking vitamin and mineral supplements as recommended by your health care provider. What happens during an annual well check? The services and screenings done by your health care provider during your annual well check will depend on your age, overall health, lifestyle risk factors, and family history of disease. Counseling  Your health care provider may ask you questions about your:  Alcohol use.  Tobacco use.  Drug use.  Emotional well-being.  Home  and relationship well-being.  Sexual activity.  Eating habits.  History of falls.  Memory and ability to understand (cognition).  Work and work Astronomer.  Reproductive health. Screening  You may have the following tests or measurements:  Height, weight, and BMI.  Blood pressure.  Lipid and cholesterol levels. These may be checked every 5 years, or more frequently if you are over 38 years old.  Skin check.  Lung cancer screening. You may have this screening every year starting at age 25 if you have a 30-pack-year history of smoking and currently smoke or have quit within the past 15 years.  Fecal occult blood test (FOBT) of the stool. You may have this test every year starting at age 29.  Flexible sigmoidoscopy or colonoscopy. You may have a sigmoidoscopy every 5 years or a colonoscopy every 10 years starting at age 58.  Hepatitis C blood test.  Hepatitis B blood test.  Sexually transmitted disease (STD) testing.  Diabetes screening. This is done by checking your blood sugar (glucose) after you have not eaten for a while (fasting). You may have this done every 1-3 years.  Bone density scan. This is done to screen for osteoporosis. You may have this done starting at age 52.  Mammogram. This may be done every 1-2 years. Talk to your health care provider about how often you should have regular mammograms. Talk with your health care provider about your test results, treatment options, and if necessary, the need for more tests. Vaccines  Your health care provider may recommend certain vaccines, such as:  Influenza vaccine. This is recommended every year.  Tetanus, diphtheria, and acellular pertussis (Tdap, Td) vaccine.  You may need a Td booster every 10 years.  Zoster vaccine. You may need this after age 13.  Pneumococcal 13-valent conjugate (PCV13) vaccine. One dose is recommended after age 69.  Pneumococcal polysaccharide (PPSV23) vaccine. One dose is recommended  after age 52. Talk to your health care provider about which screenings and vaccines you need and how often you need them. This information is not intended to replace advice given to you by your health care provider. Make sure you discuss any questions you have with your health care provider. Document Released: 05/05/2015 Document Revised: 12/27/2015 Document Reviewed: 02/07/2015 Elsevier Interactive Patient Education  2017 [place] Prevention in the Home Falls can cause injuries. They can happen to people of all ages. There are many things you can do to make your home safe and to help prevent falls. What can I do on the outside of my home?  Regularly fix the edges of walkways and driveways and fix any cracks.  Remove anything that might make you trip as you walk through a door, such as a raised step or threshold.  Trim any bushes or trees on the path to your home.  Use bright outdoor lighting.  Clear any walking paths of anything that might make someone trip, such as rocks or tools.  Regularly check to see if handrails are loose or broken. Make sure that both sides of any steps have handrails.  Any raised decks and porches should have guardrails on the edges.  Have any leaves, snow, or ice cleared regularly.  Use sand or salt on walking paths during winter.  Clean up any spills in your garage right away. This includes oil or grease spills. What can I do in the bathroom?  Use night lights.  Install grab bars by the toilet and in the tub and shower. Do not use towel bars as grab bars.  Use non-skid mats or decals in the tub or shower.  If you need to sit down in the shower, use a plastic, non-slip stool.  Keep the floor dry. Clean up any water that spills on the floor as soon as it happens.  Remove soap buildup in the tub or shower regularly.  Attach bath mats securely with double-sided non-slip rug tape.  Do not have throw rugs and other things on the floor  that can make you trip. What can I do in the bedroom?  Use night lights.  Make sure that you have a light by your bed that is easy to reach.  Do not use any sheets or blankets that are too big for your bed. They should not hang down onto the floor.  Have a firm chair that has side arms. You can use this for support while you get dressed.  Do not have throw rugs and other things on the floor that can make you trip. What can I do in the kitchen?  Clean up any spills right away.  Avoid walking on wet floors.  Keep items that you use a lot in easy-to-reach places.  If you need to reach something above you, use a strong step stool that has a grab bar.  Keep electrical cords out of the way.  Do not use floor polish or wax that makes floors slippery. If you must use wax, use non-skid floor wax.  Do not have throw rugs and other things on the floor that can make you trip. What can I do with my stairs?  Do not leave any  items on the stairs.  Make sure that there are handrails on both sides of the stairs and use them. Fix handrails that are broken or loose. Make sure that handrails are as long as the stairways.  Check any carpeting to make sure that it is firmly attached to the stairs. Fix any carpet that is loose or worn.  Avoid having throw rugs at the top or bottom of the stairs. If you do have throw rugs, attach them to the floor with carpet tape.  Make sure that you have a light switch at the top of the stairs and the bottom of the stairs. If you do not have them, ask someone to add them for you. What else can I do to help prevent falls?  Wear shoes that:  Do not have high heels.  Have rubber bottoms.  Are comfortable and fit you well.  Are closed at the toe. Do not wear sandals.  If you use a stepladder:  Make sure that it is fully opened. Do not climb a closed stepladder.  Make sure that both sides of the stepladder are locked into place.  Ask someone to hold it  for you, if possible.  Clearly mark and make sure that you can see:  Any grab bars or handrails.  First and last steps.  Where the edge of each step is.  Use tools that help you move around (mobility aids) if they are needed. These include:  Canes.  Walkers.  Scooters.  Crutches.  Turn on the lights when you go into a dark area. Replace any light bulbs as soon as they burn out.  Set up your furniture so you have a clear path. Avoid moving your furniture around.  If any of your floors are uneven, fix them.  If there are any pets around you, be aware of where they are.  Review your medicines with your doctor. Some medicines can make you feel dizzy. This can increase your chance of falling. Ask your doctor what other things that you can do to help prevent falls. This information is not intended to replace advice given to you by your health care provider. Make sure you discuss any questions you have with your health care provider. Document Released: 02/02/2009 Document Revised: 09/14/2015 Document Reviewed: 05/13/2014 Elsevier Interactive Patient Education  2017 [employer].

## 2020-06-22 NOTE — Progress Notes (Signed)
This service is provided via telemedicine  No vital signs collected/recorded due to the encounter was a telemedicine visit.   Location of patient (ex: home, work): Home.  Patient consents to a telephone visit: Yes.  Location of the provider (ex: office, home):  [facility].  Name of any referring provider: [name], Donalee Citrin, NP   Names of all persons participating in the telemedicine service and their role in the encounter: Patient, [name], RMA, [name], Carilyn Goodpasture, NP.    Time spent on call: 8 minutes spent on the phone with Medical Assistant.       Subjective:   Crystal Preston is a 80 y.o. female who presents for Medicare Annual (Subsequent) preventive examination.  Review of Systems     Cardiac Risk Factors include: advanced age (>37men, >70 women);hypertension;dyslipidemia;obesity (BMI >30kg/m2)     Objective:    Today's Vitals   There is no height or weight on file to calculate BMI.  Advanced Directives 06/22/2020 06/14/2019 01/08/2019 06/16/2018 06/12/2018 06/05/2017 12/06/2016  Does Patient Have a Medical Advance Directive? No No Yes No No No Yes;No  Would patient like information on creating a medical advance directive? No - Patient declined No - Patient declined - No - Patient declined No - Patient declined Yes (MAU/Ambulatory/Procedural Areas - Information given) -    Current Medications (verified) Outpatient Encounter Medications as of 06/22/2020  Medication Sig  . aspirin 81 MG tablet Take 81 mg by mouth daily.  Marland Kitchen Bioflavonoid Products (ESTER C PO) Take 1 tablet (1000 mg ) by mouth once a day  . CALCIUM CITRATE PO Take 1 tablet (1000 mg ) by mouth daily  . Cholecalciferol 4000 units TABS Take 1 tablet by mouth daily  . Cod Liver Oil CAPS Take 1 tablet by mouth daily  . hydrALAZINE (APRESOLINE) 25 MG tablet TAKE 1 TABLET BY MOUTH THREE TIMES A DAY  . ibandronate (BONIVA) 150 MG tablet TAKE 1 EVERY 30 DAYS IN THE AM WITH FULL GLASS OF WATER ON EMPTY  STOMACH. DO NOT LIE DOWN X30 MINUTE  . ibuprofen (ADVIL) 800 MG tablet Take 1 tablet (800 mg total) by mouth 2 (two) times daily as needed.  Marland Kitchen losartan (COZAAR) 100 MG tablet TAKE 1 TABLET BY MOUTH EVERY DAY  . Naphazoline HCl (CLEAR EYES OP) Apply to eye as needed.   . pantoprazole (PROTONIX) 40 MG tablet TAKE 1 TABLET BY MOUTH DAILY. PLEASE CALL TO SCHEDULE FOLLOW UP APPOINTMENT  . pravastatin (PRAVACHOL) 40 MG tablet TAKE 1 TABLET BY MOUTH EVERY DAY   No facility-administered encounter medications on file as of 06/22/2020.    Allergies (verified) Naproxen, Nitrofuran derivatives, Propoxyphene, and Azithromycin   History: Past Medical History:  Diagnosis Date  . GERD (gastroesophageal reflux disease)   . HTN (hypertension)   . Hyperlipidemia   . Insomnia, unspecified   . Obesity, unspecified   . Osteoarthritis   . Other abnormality of red blood cells    Microcytosis  . Peripheral vascular disease, unspecified (HCC)   . Vertigo, benign positional    Head to left side   Past Surgical History:  Procedure Laterality Date  . ABLATION ON ENDOMETRIOSIS  1980  . CHOLECYSTECTOMY, LAPAROSCOPIC  1989  . COLONOSCOPY  2008  . COLONOSCOPY  2013  . TOTAL ABDOMINAL HYSTERECTOMY  1971   Family History  Problem Relation Age of Onset  . Cancer Father 35  . Cancer Mother 58       cervical cancer  .  Glaucoma Brother    Social History   Socioeconomic History  . Marital status: Widowed    Spouse name: Not on file  . Number of children: 2  . Years of education: Not on file  . Highest education level: Not on file  Occupational History  . Occupation: Stocker  Tobacco Use  . Smoking status: Never Smoker  . Smokeless tobacco: Never Used  Vaping Use  . Vaping Use: Never used  Substance and Sexual Activity  . Alcohol use: No  . Drug use: No  . Sexual activity: Not on file  Other Topics Concern  . Not on file  Social History Narrative   Diet:       Do you drink/ eat things with  caffeine? Yes/Coffee      Marital status:  Widowed                             What year were you married ? [id number]      Do you live in a house, apartment,assistred living, condo, trailer, etc.)?Apt.      Is it one or more stories? 1      How many persons live in your home ? 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   Yes                           Type & how often: Bike/Row 2-5 times a week      Do you have a living will? No      Do you have a DNR form?  No                     If not, do you want to discuss one? No      Do you have signed POA?HPOA forms?  No               If so, please bring to your        appointment   Social Determinants of Health   Financial Resource Strain: Not on file  Food Insecurity: Not on file  Transportation Needs: Not on file  Physical Activity: Not on file  Stress: Not on file  Social Connections: Not on file    Tobacco Counseling Counseling given: Not Answered   Clinical Intake:  Pre-visit preparation completed: No  Pain : 0-10 Pain Score:  (sometimes it's a 10 on scale) Pain Type: Chronic pain Pain Location: Back Pain Orientation: Lower Pain Descriptors / Indicators: Aching Pain Onset: Other (comment) (one year) Pain Frequency: Intermittent Pain Relieving Factors: OTC rub,epidural Effect of Pain on Daily Activities: standing  Pain Relieving Factors: OTC rub,epidural  BMI - recorded: 36.68 Nutritional Status: BMI > 30  Obese Nutritional Risks: None Diabetes: No  How often do you need to have someone help you when you read instructions, pamphlets, or other written materials from your doctor or pharmacy?: 1 - Never What is the last grade level you completed in school?: 11 grade  Diabetic? No   Interpreter Needed?: No  Information entered by :: Dinah [name] FNP-C   Activities of Daily Living In your present state of health, do you  have any difficulty performing the following activities: 06/22/2020  Hearing? N  Vision?  N  Difficulty concentrating or making decisions? N  Walking or climbing stairs? Y  Comment lower back pain  Dressing or bathing? N  Doing errands, shopping? Y  Comment sister as Ship broker and eating ? N  Using the Toilet? N  In the past six months, have you accidently leaked urine? Y  Comment wears pad for stress incontinnce  Do you have problems with loss of bowel control? N  Managing your Medications? N  Managing your Finances? N  Housekeeping or managing your Housekeeping? N  Some recent data might be hidden    Patient Care Team: [name], Donalee Citrin, NP as PCP - General (Family Medicine) Elise Benne, MD as Consulting Physician (Ophthalmology)  Indicate any recent Medical Services you may have received from other than [facility] providers in the past year (date may be approximate).     Assessment:   This is a routine wellness examination for Crystal Preston.  Hearing/Vision screen  Hearing Screening   125Hz  250Hz  500Hz  1000Hz  2000Hz  3000Hz  4000Hz  6000Hz  8000Hz   Right ear:           Left ear:           Comments: No current Hearing Concerns. Patient is considering getting hearing aids.   Vision Screening Comments: No Vision Concerns. Patient last eye exam was 2021. Patient wears prescription glasses.  Dietary issues and exercise activities discussed: Current Exercise Habits: Home exercise routine, Type of exercise: treadmill (bicycle,rowing machine), Time (Minutes): 60, Frequency (Times/Week): 4, Weekly Exercise (Minutes/Week): 240, Intensity: Mild  Goals    . <enter goal here>     Starting 05/24/16, I will maintain my current lifestyle.       Depression Screen PHQ 2/9 Scores 06/22/2020 06/14/2019 01/22/2019 01/08/2019 06/12/2018 12/04/2017 06/05/2017  PHQ - 2 Score 0 0 0 0 0 0 0    Fall Risk Fall Risk  06/22/2020 06/14/2019 01/22/2019 01/08/2019 06/16/2018  Falls in the past year? 0 0 0 0 0   Number falls in past yr: 0 0 - - 0  Injury with Fall? 0 0 0 0 0    FALL RISK PREVENTION PERTAINING TO THE HOME:  Any stairs in or around the home? No  If so, are there any without handrails? No  Home free of loose throw rugs in walkways, pet beds, electrical cords, etc? No  Adequate lighting in your home to reduce risk of falls? Yes   ASSISTIVE DEVICES UTILIZED TO PREVENT FALLS:  Life alert? No  Use of a cane, walker or w/c? No  Grab bars in the bathroom? Yes  Shower chair or bench in shower? No  Elevated toilet seat or a handicapped toilet? Yes   TIMED UP AND GO:  Was the test performed? No .  Length of time to ambulate 10 feet: N/A sec.   Gait slow and steady without use of assistive device  Cognitive Function: MMSE - Mini Mental State Exam 06/12/2018 06/05/2017 05/24/2016  Orientation to time 5 5 5   Orientation to Place 4 5 5   Registration 3 3 3   Attention/ Calculation 5 5 5   Recall 2 1 3   Language- name 2 objects 2 2 2   Language- repeat 1 1 1   Language- follow 3 step command 3 3 2   Language- read & follow direction 1 1 1   Write a sentence 1 1 1   Copy design 1 0 0  Total score 28 27 28      6CIT Screen 06/22/2020 06/14/2019  What Year? 0 points 0  points  What month? 0 points 0 points  What time? 0 points 0 points  Count back from 20 0 points 0 points  Months in reverse 0 points 0 points  Repeat phrase 0 points 0 points  Total Score 0 0    Immunizations Immunization History  Administered Date(s) Administered  . Influenza,inj,Quad PF,6+ Mos 02/28/2016  . PFIZER(Purple Top)SARS-COV-2 Vaccination 06/18/2019, 07/13/2019, 02/09/2020  . Pneumococcal Conjugate-13 05/24/2016    TDAP status: Due, Education has been provided regarding the importance of this vaccine. Advised may receive this vaccine at local pharmacy or Health Dept. Aware to provide a copy of the vaccination record if obtained from local pharmacy or Health Dept. Verbalized acceptance and  understanding.  Flu Vaccine status: Declined, Education has been provided regarding the importance of this vaccine but patient still declined. Advised may receive this vaccine at local pharmacy or Health Dept. Aware to provide a copy of the vaccination record if obtained from local pharmacy or Health Dept. Verbalized acceptance and understanding.  Pneumococcal vaccine status: Declined,  Education has been provided regarding the importance of this vaccine but patient still declined. Advised may receive this vaccine at local pharmacy or Health Dept. Aware to provide a copy of the vaccination record if obtained from local pharmacy or Health Dept. Verbalized acceptance and understanding.   Covid-19 vaccine status: Completed vaccines  Qualifies for Shingles Vaccine? No   Zostavax completed No   Shingrix Completed?: No.    Education has been provided regarding the importance of this vaccine. Patient has been advised to call insurance company to determine out of pocket expense if they have not yet received this vaccine. Advised may also receive vaccine at local pharmacy or Health Dept. Verbalized acceptance and understanding.  Screening Tests Health Maintenance  Topic Date Due  . TETANUS/TDAP  Never done  . PNA vac Low Risk Adult (2 of 2 - PPSV23) 05/24/2017  . INFLUENZA VACCINE  11/21/2019  . DEXA SCAN  Completed  . COVID-19 Vaccine  Completed  . HPV VACCINES  Aged Out    Health Maintenance  Health Maintenance Due  Topic Date Due  . TETANUS/TDAP  Never done  . PNA vac Low Risk Adult (2 of 2 - PPSV23) 05/24/2017  . INFLUENZA VACCINE  11/21/2019    Colorectal cancer screening: No longer required.   Mammogram status: No longer required due to age .  Bone Density status: Completed 12/1/20217 . Results reflect: Bone density results: OSTEOPENIA. Repeat every 3 years.  Lung Cancer Screening: (Low Dose CT Chest recommended if Age 38-80 years, 30 pack-year currently smoking OR have quit w/in  15years.) does not qualify.   Lung Cancer Screening Referral: No  Additional Screening:  Hepatitis C Screening: does not qualify; Completed No   Vision Screening: Recommended annual ophthalmology exams for early detection of glaucoma and other disorders of the eye. Is the patient up to date with their annual eye exam?  Yes  Who is the provider or what is the name of the office in which the patient attends annual eye exams?[facility]  If pt is not established with a provider, would they like to be referred to a provider to establish care? No .   Dental Screening: Recommended annual dental exams for proper oral hygiene  Community Resource Referral / Chronic Care Management: CRR required this visit?  No   CCM required this visit?  No     Plan:    due for Influenza vaccine,PNA,Shinglesand Tetanus vaccine but has declined all vaccine  states COVID-19 vaccine affected and caused back pain.Will never take any vaccine again.    I have personally reviewed and noted the following in the patient's chart:   . Medical and social history . Use of alcohol, tobacco or illicit drugs  . Current medications and supplements . Functional ability and status . Nutritional status . Physical activity . Advanced directives . List of other physicians . Hospitalizations, surgeries, and ER visits in previous 12 months . Vitals . Screenings to include cognitive, depression, and falls . Referrals and appointments  In addition, I have reviewed and discussed with patient certain preventive protocols, quality metrics, and best practice recommendations. A written personalized care plan for preventive services as well as general preventive health recommendations were provided to patient.    Caesar Bookmaninah C [name], NP   06/22/2020   Nurse Notes: Refused Influenza vaccine,PNA,Shinglesand Tetanus vaccine.

## 2020-06-30 ENCOUNTER — Other Ambulatory Visit: Payer: Self-pay

## 2020-06-30 ENCOUNTER — Encounter: Payer: Self-pay | Admitting: Family

## 2020-06-30 ENCOUNTER — Ambulatory Visit (INDEPENDENT_AMBULATORY_CARE_PROVIDER_SITE_OTHER): Payer: Medicare Other | Admitting: Family

## 2020-06-30 VITALS — BP 140/88 | HR 75 | Temp 97.1°F | Resp 16 | Ht 60.0 in | Wt 196.8 lb

## 2020-06-30 DIAGNOSIS — I10 Essential (primary) hypertension: Secondary | ICD-10-CM

## 2020-06-30 DIAGNOSIS — E782 Mixed hyperlipidemia: Secondary | ICD-10-CM | POA: Diagnosis not present

## 2020-06-30 DIAGNOSIS — M8949 Other hypertrophic osteoarthropathy, multiple sites: Secondary | ICD-10-CM | POA: Diagnosis not present

## 2020-06-30 DIAGNOSIS — M159 Polyosteoarthritis, unspecified: Secondary | ICD-10-CM

## 2020-06-30 DIAGNOSIS — M810 Age-related osteoporosis without current pathological fracture: Secondary | ICD-10-CM

## 2020-06-30 DIAGNOSIS — K219 Gastro-esophageal reflux disease without esophagitis: Secondary | ICD-10-CM | POA: Diagnosis not present

## 2020-06-30 MED ORDER — LOSARTAN POTASSIUM 100 MG PO TABS: 100.0000 mg | ORAL_TABLET | Freq: Every day | ORAL | 1 refills | Status: DC

## 2020-06-30 MED ORDER — PRAVASTATIN SODIUM 40 MG PO TABS: 40.0000 mg | ORAL_TABLET | Freq: Every day | ORAL | 1 refills | Status: DC

## 2020-06-30 MED ORDER — HYDRALAZINE HCL 25 MG PO TABS: 25.0000 mg | ORAL_TABLET | Freq: Three times a day (TID) | ORAL | 5 refills | Status: DC

## 2020-06-30 MED ORDER — PANTOPRAZOLE SODIUM 40 MG PO TBEC: DELAYED_RELEASE_TABLET | ORAL | 0 refills | Status: DC

## 2020-06-30 NOTE — Patient Instructions (Signed)
- check B/p at home and Notify provider if > 140/90  [email or web address]">  DASH Eating Plan DASH stands for Dietary Approaches to Stop Hypertension. The DASH eating plan is a healthy eating plan that has been shown to:  Reduce high blood pressure (hypertension).  Reduce your risk for type 2 diabetes, heart disease, and stroke.  Help with weight loss. What are tips for following this plan? Reading food labels  Check food labels for the amount of salt (sodium) per serving. Choose foods with less than 5 percent of the Daily Value of sodium. Generally, foods with less than 300 milligrams (mg) of sodium per serving fit into this eating plan.  To find whole grains, look for the word "whole" as the first word in the ingredient list. Shopping  Buy products labeled as "low-sodium" or "no salt added."  Buy fresh foods. Avoid canned foods and pre-made or frozen meals. Cooking  Avoid adding salt when cooking. Use salt-free seasonings or herbs instead of table salt or sea salt. Check with your health care provider or pharmacist before using salt substitutes.  Do not fry foods. Cook foods using healthy methods such as baking, boiling, grilling, roasting, and broiling instead.  Cook with heart-healthy oils, such as olive, canola, avocado, soybean, or sunflower oil. Meal planning  Eat a balanced diet that includes: ? 4 or more servings of fruits and 4 or more servings of vegetables each day. Try to fill one-half of your plate with fruits and vegetables. ? 6-8 servings of whole grains each day. ? Less than 6 oz (170 g) of lean meat, poultry, or fish each day. A 3-oz (85-g) serving of meat is about the same size as a deck of cards. One egg equals 1 oz (28 g). ? 2-3 servings of low-fat dairy each day. One serving is 1 cup (237 mL). ? 1 serving of nuts, seeds, or beans 5 times each week. ? 2-3 servings of heart-healthy fats. Healthy fats called omega-3  fatty acids are found in foods such as walnuts, flaxseeds, fortified milks, and eggs. These fats are also found in cold-water fish, such as sardines, salmon, and mackerel.  Limit how much you eat of: ? Canned or prepackaged foods. ? Food that is high in trans fat, such as some fried foods. ? Food that is high in saturated fat, such as fatty meat. ? Desserts and other sweets, sugary drinks, and other foods with added sugar. ? Full-fat dairy products.  Do not salt foods before eating.  Do not eat more than 4 egg yolks a week.  Try to eat at least 2 vegetarian meals a week.  Eat more home-cooked food and less restaurant, buffet, and fast food.   Lifestyle  When eating at a restaurant, ask that your food be prepared with less salt or no salt, if possible.  If you drink alcohol: ? Limit how much you use to:  0-1 drink a day for women who are not pregnant.  0-2 drinks a day for men. ? Be aware of how much alcohol is in your drink. In the [place] one drink equals one 12 oz bottle of beer (355 mL), one 5 oz glass of wine (148 mL), or one 1 oz glass of hard liquor (44 mL). General information  Avoid eating more than 2,300 mg of salt a day. If you have hypertension, you may need to reduce your sodium intake to 1,500 mg a day.  Work with your health care provider to maintain a healthy body  weight or to lose weight. Ask what an ideal weight is for you.  Get at least 30 minutes of exercise that causes your heart to beat faster (aerobic exercise) most days of the week. Activities may include walking, swimming, or biking.  Work with your health care provider or dietitian to adjust your eating plan to your individual calorie needs. What foods should I eat? Fruits All fresh, dried, or frozen fruit. Canned fruit in natural juice (without added sugar). Vegetables Fresh or frozen vegetables (raw, steamed, roasted, or grilled). Low-sodium or reduced-sodium tomato and vegetable juice. Low-sodium or  reduced-sodium tomato sauce and tomato paste. Low-sodium or reduced-sodium canned vegetables. Grains Whole-grain or whole-wheat bread. Whole-grain or whole-wheat pasta. Brown rice. Orpah Cobb. Bulgur. Whole-grain and low-sodium cereals. Pita bread. Low-fat, low-sodium crackers. Whole-wheat flour tortillas. Meats and other proteins Skinless chicken or [place]. Ground chicken or [place]. Pork with fat trimmed off. Fish and seafood. Egg whites. Dried beans, peas, or lentils. Unsalted nuts, nut butters, and seeds. Unsalted canned beans. Lean cuts of beef with fat trimmed off. Low-sodium, lean precooked or cured meat, such as sausages or meat loaves. Dairy Low-fat (1%) or fat-free (skim) milk. Reduced-fat, low-fat, or fat-free cheeses. Nonfat, low-sodium ricotta or cottage cheese. Low-fat or nonfat yogurt. Low-fat, low-sodium cheese. Fats and oils Soft margarine without trans fats. Vegetable oil. Reduced-fat, low-fat, or light mayonnaise and salad dressings (reduced-sodium). Canola, safflower, olive, avocado, soybean, and sunflower oils. Avocado. Seasonings and condiments Herbs. Spices. Seasoning mixes without salt. Other foods Unsalted popcorn and pretzels. Fat-free sweets. The items listed above may not be a complete list of foods and beverages you can eat. Contact a dietitian for more information. What foods should I avoid? Fruits Canned fruit in a light or heavy syrup. Fried fruit. Fruit in cream or butter sauce. Vegetables Creamed or fried vegetables. Vegetables in a cheese sauce. Regular canned vegetables (not low-sodium or reduced-sodium). Regular canned tomato sauce and paste (not low-sodium or reduced-sodium). Regular tomato and vegetable juice (not low-sodium or reduced-sodium). Rosita Fire. Olives. Grains Baked goods made with fat, such as croissants, muffins, or some breads. Dry pasta or rice meal packs. Meats and other proteins Fatty cuts of meat. Ribs. Fried meat. Tomasa Blase. Bologna,  salami, and other precooked or cured meats, such as sausages or meat loaves. Fat from the back of a pig (fatback). Bratwurst. Salted nuts and seeds. Canned beans with added salt. Canned or smoked fish. Whole eggs or egg yolks. Chicken or [place] with skin. Dairy Whole or 2% milk, cream, and half-and-half. Whole or full-fat cream cheese. Whole-fat or sweetened yogurt. Full-fat cheese. Nondairy creamers. Whipped toppings. Processed cheese and cheese spreads. Fats and oils Butter. Stick margarine. Lard. Shortening. Ghee. Bacon fat. Tropical oils, such as coconut, palm kernel, or palm oil. Seasonings and condiments Onion salt, garlic salt, seasoned salt, table salt, and sea salt. Worcestershire sauce. Tartar sauce. Barbecue sauce. Teriyaki sauce. Soy sauce, including reduced-sodium. Steak sauce. Canned and packaged gravies. Fish sauce. Oyster sauce. Cocktail sauce. Store-bought horseradish. Ketchup. Mustard. Meat flavorings and tenderizers. Bouillon cubes. Hot sauces. Pre-made or packaged marinades. Pre-made or packaged taco seasonings. Relishes. Regular salad dressings. Other foods Salted popcorn and pretzels. The items listed above may not be a complete list of foods and beverages you should avoid. Contact a dietitian for more information. Where to find more information  National Heart, Lung, and Blood Institute: PopSteam.is  American Heart Association: www.heart.org  Academy of Nutrition and Dietetics: www.eatright.org  National Kidney Foundation: www.kidney.org Summary  The DASH  eating plan is a healthy eating plan that has been shown to reduce high blood pressure (hypertension). It may also reduce your risk for type 2 diabetes, heart disease, and stroke.  When on the DASH eating plan, aim to eat more fresh fruits and vegetables, whole grains, lean proteins, low-fat dairy, and heart-healthy fats.  With the DASH eating plan, you should limit salt (sodium) intake to 2,300 mg a day. If you  have hypertension, you may need to reduce your sodium intake to 1,500 mg a day.  Work with your health care provider or dietitian to adjust your eating plan to your individual calorie needs. This information is not intended to replace advice given to you by your health care provider. Make sure you discuss any questions you have with your health care provider. Document Revised: 03/12/2019 Document Reviewed: 03/12/2019 Elsevier Patient Education  2021 [employer].

## 2020-06-30 NOTE — Progress Notes (Signed)
Provider: Marlowe Sax FNP-C   Netha Dafoe, Nelda Bucks, NP  Patient Care Team: Hailie Searight, Nelda Bucks, NP as PCP - General (Family Medicine) Sharyne Peach, MD as Consulting Physician (Ophthalmology)  Extended Emergency Contact Information Primary Emergency Contact: [name],[name] Address: [address] [place], [place] [address] Johnnette Litter of [place] Phone: [phone] Mobile Phone: [phone] Relation: Daughter  Code Status: full Code  Goals of care: Advanced Directive information Advanced Directives 06/30/2020  Does Patient Have a Medical Advance Directive? No  Would patient like information on creating a medical advance directive? No - Patient declined     Chief Complaint  Patient presents with  . Medical Management of Chronic Issues    5 month follow up.    HPI:  Pt is a 80 y.o. female seen today for 5 months follow up for  medical management of chronic diseases.  Hypertension - has been out of her blood pressure medication.Needs refill.States called Pharmacy but was told needed to be seen.she missed her appointment in 2021. She denies any headache,dizziness,fatigue,chest tightness,palpitation or shortness of breath.on losartan 100 mg tablet daily,hydralazaine 25 mg tablet three times daily     GERD - tries to avoid food which aggravated the symptom.takes Protonix 40 mg tablet daily.denies any dark stool or blood in the stool.   Hyperlipidemia - run out of her Pravastatin 40 mg tablet daily and cod liver oil Caps daily .she does exercise at the gym at least three times per week.Includes veggies in her diet.  Osteoarthritis - reports pain on lower back and knees.exericise does help some.lower back surgery has been recommended by Orthopedic but states afraid of the recovery process does not want to go to rehab.Her sister went to rehab for several months after she had surgery " I don't like those place". Take Ibuprofen as needed.I've discussed long term side  effects with use of ibuprofen and recommended tylenol instead.  Osteopenia  - latest bone density done 03/22/2016 reviewed left Femur Neck T-score was -2.4 takes calcium citrate,vitamin D 4000 units daily and Boniva 150 mg tablet monthly   Has declined Tdap,Influenza and PNA vaccine.  Has completed her two COVID-19 vaccine including booster.    Past Medical History:  Diagnosis Date  . GERD (gastroesophageal reflux disease)   . HTN (hypertension)   . Hyperlipidemia   . Insomnia, unspecified   . Obesity, unspecified   . Osteoarthritis   . Other abnormality of red blood cells    Microcytosis  . Peripheral vascular disease, unspecified ([facility])   . Vertigo, benign positional    Head to left side   Past Surgical History:  Procedure Laterality Date  . ABLATION ON ENDOMETRIOSIS  1980  . CHOLECYSTECTOMY, LAPAROSCOPIC  1989  . COLONOSCOPY  2008  . COLONOSCOPY  2013  . TOTAL ABDOMINAL HYSTERECTOMY  1971    Allergies  Allergen Reactions  . Naproxen   . Nitrofuran Derivatives Other (See Comments)    Knots on body  . Propoxyphene     Darvocet  . Azithromycin Other (See Comments)    headache, excessive sleepiness    Allergies as of 06/30/2020      Reactions   Naproxen    Nitrofuran Derivatives Other (See Comments)   Knots on body   Propoxyphene    Darvocet   Azithromycin Other (See Comments)   headache, excessive sleepiness      Medication List       Accurate as of June 30, 2020  2:33 PM. If you have any questions, ask your nurse or doctor.        aspirin 81 MG tablet Take 81 mg by mouth daily.   CALCIUM CITRATE PO Take 1 tablet (1000 mg ) by mouth daily   Cholecalciferol 100 MCG (4000 UT) Tabs Take 1 tablet by mouth daily   CLEAR EYES OP Apply to eye as needed.   Cod Liver Oil Caps Take 1 tablet by mouth daily   ESTER C PO Take 1 tablet (1000 mg ) by mouth once a day   hydrALAZINE 25 MG tablet Commonly known as: APRESOLINE TAKE 1 TABLET BY MOUTH THREE  TIMES A DAY   ibandronate 150 MG tablet Commonly known as: BONIVA TAKE 1 EVERY 30 DAYS IN THE AM WITH FULL GLASS OF WATER ON EMPTY STOMACH. DO NOT LIE DOWN X30 MINUTE   ibuprofen 800 MG tablet Commonly known as: ADVIL Take 1 tablet (800 mg total) by mouth 2 (two) times daily as needed.   losartan 100 MG tablet Commonly known as: COZAAR TAKE 1 TABLET BY MOUTH EVERY DAY   pantoprazole 40 MG tablet Commonly known as: PROTONIX TAKE 1 TABLET BY MOUTH DAILY. PLEASE CALL TO SCHEDULE FOLLOW UP APPOINTMENT   pravastatin 40 MG tablet Commonly known as: PRAVACHOL TAKE 1 TABLET BY MOUTH EVERY DAY       Review of Systems  Constitutional: Negative for appetite change, chills, fatigue and fever.  HENT: Negative for congestion, rhinorrhea, sinus pressure, sinus pain, sneezing, sore throat and trouble swallowing.   Eyes: Positive for visual disturbance. Negative for discharge and redness.       Wears eye glasses   Respiratory: Negative for cough, chest tightness, shortness of breath and wheezing.   Cardiovascular: Negative for chest pain, palpitations and leg swelling.  Gastrointestinal: Negative for abdominal distention, abdominal pain, constipation, diarrhea, nausea and vomiting.  Endocrine: Negative for cold intolerance, heat intolerance, polydipsia, polyphagia and polyuria.  Genitourinary: Negative for difficulty urinating, dysuria, flank pain, frequency and urgency.  Musculoskeletal: Positive for arthralgias and back pain. Negative for joint swelling and myalgias.  Skin: Negative for color change, pallor and rash.  Neurological: Negative for dizziness, speech difficulty, weakness, light-headedness and headaches.       Intermittent Numbness on left foot   Hematological: Does not bruise/bleed easily.  Psychiatric/Behavioral: Negative for agitation, behavioral problems, confusion and sleep disturbance. The patient is not nervous/anxious.     Immunization History  Administered Date(s)  Administered  . Influenza,inj,Quad PF,6+ Mos 02/28/2016  . PFIZER(Purple Top)SARS-COV-2 Vaccination 06/18/2019, 07/13/2019, 02/09/2020  . Pneumococcal Conjugate-13 05/24/2016   Pertinent  Health Maintenance Due  Topic Date Due  . INFLUENZA VACCINE  07/20/2020 (Originally 11/21/2019)  . PNA vac Low Risk Adult (2 of 2 - PPSV23) 06/22/2021 (Originally 05/24/2017)  . DEXA SCAN  Completed   Fall Risk  06/30/2020 06/22/2020 06/14/2019 01/22/2019 01/08/2019  Falls in the past year? 0 0 0 0 0  Number falls in past yr: 0 0 0 - -  Injury with Fall? 0 0 0 0 0   Functional Status Survey:    Vitals:   06/30/20 1406  BP: 140/88  Pulse: 75  Resp: 16  Temp: (!) 97.1 F (36.2 C)  SpO2: 98%  Weight: 196 lb 12.8 oz (89.3 kg)  Height: 5' (1.524 m)   Body mass index is 38.43 kg/m. Physical Exam Vitals reviewed.  Constitutional:      General: She is not in acute distress.  Appearance: She is obese. She is not ill-appearing.  HENT:     Head: Normocephalic.     Right Ear: Tympanic membrane, ear canal and external ear normal. There is no impacted cerumen.     Left Ear: Tympanic membrane, ear canal and external ear normal. There is no impacted cerumen.     Nose: Nose normal. No congestion or rhinorrhea.     Mouth/Throat:     Mouth: Mucous membranes are moist.     Pharynx: Oropharynx is clear. No oropharyngeal exudate or posterior oropharyngeal erythema.  Eyes:     General: No scleral icterus.       Right eye: No discharge.        Left eye: No discharge.     Extraocular Movements: Extraocular movements intact.     Conjunctiva/sclera: Conjunctivae normal.     Pupils: Pupils are equal, round, and reactive to light.  Neck:     Vascular: No carotid bruit.  Cardiovascular:     Rate and Rhythm: Normal rate and regular rhythm.     Pulses: Normal pulses.     Heart sounds: Normal heart sounds. No murmur heard. No friction rub. No gallop.   Pulmonary:     Effort: Pulmonary effort is normal. No  respiratory distress.     Breath sounds: Normal breath sounds. No wheezing, rhonchi or rales.  Chest:     Chest wall: No tenderness.  Abdominal:     General: Bowel sounds are normal. There is no distension.     Palpations: Abdomen is soft. There is no mass.     Tenderness: There is no abdominal tenderness. There is no right CVA tenderness, left CVA tenderness, guarding or rebound.  Musculoskeletal:        General: No swelling or tenderness. Normal range of motion.     Cervical back: Normal range of motion. No rigidity or tenderness.     Right knee: Crepitus present. No swelling, effusion, erythema or ecchymosis. Normal range of motion. No tenderness.     Left knee: Crepitus present. No swelling, effusion, erythema or ecchymosis. Normal range of motion. No tenderness.     Right lower leg: No edema.     Left lower leg: No edema.  Lymphadenopathy:     Cervical: No cervical adenopathy.  Skin:    General: Skin is warm and dry.     Coloration: Skin is not pale.     Findings: No bruising, erythema or rash.  Neurological:     Mental Status: She is alert and oriented to person, place, and time.     Cranial Nerves: No cranial nerve deficit.     Sensory: No sensory deficit.     Motor: No weakness.     Coordination: Coordination normal.     Gait: Gait normal.  Psychiatric:        Mood and Affect: Mood normal.        Behavior: Behavior normal.        Thought Content: Thought content normal.        Judgment: Judgment normal.     Labs reviewed: Recent Labs    07/16/19 1329  NA 138  K 3.9  CL 103  CO2 24  GLUCOSE 119  BUN 12  CREATININE 0.69  CALCIUM 9.6   Recent Labs    07/16/19 1329  AST 20  ALT 18  BILITOT 0.2  PROT 7.4   Recent Labs    07/16/19 1329  WBC 8.5  NEUTROABS 5,432  HGB 11.8  HCT 38.8  MCV 73.3*  PLT 370   Lab Results  Component Value Date   TSH 0.90 07/16/2019   Lab Results  Component Value Date   HGBA1C 5.9 (H) 01/08/2019   Lab Results   Component Value Date   CHOL 196 07/16/2019   HDL 50 07/16/2019   LDLCALC 116 (H) 07/16/2019   TRIG 178 (H) 07/16/2019   CHOLHDL 3.9 07/16/2019    Significant Diagnostic Results in last 30 days:  No results found.  Assessment/Plan 1. Essential hypertension B/p not at goal.has been out of her medication missed appointment in 2021. Will refill her hydralazine and Losartan. - continue on ASA and Statin  - Advised to check Blood pressure daily and record and Notify provider if B/p > 140/90  - DASH eating plan education information provided on AVS. - hydrALAZINE (APRESOLINE) 25 MG tablet; Take 1 tablet (25 mg total) by mouth 3 (three) times daily.  Dispense: 90 tablet; Refill: 5 - losartan (COZAAR) 100 MG tablet; Take 1 tablet (100 mg total) by mouth daily.  Dispense: 90 tablet; Refill: 1 - CBC with Differential/Platelet; Future - CMP with eGFR([facility]); Future - TSH; Future  2. Mixed hyperlipidemia LDL still slightly elevated. - continue with dietary modification and exercise  - will refill her Pravastatin.  - pravastatin (PRAVACHOL) 40 MG tablet; Take 1 tablet (40 mg total) by mouth daily.  Dispense: 90 tablet; Refill: 1 - Lipid panel; Future  3. Gastroesophageal reflux disease without esophagitis Symptoms controlled on Protonix.  - continue to avoid aggravating foods.  - H/H stable  - pantoprazole (PROTONIX) 40 MG tablet; TAKE 1 TABLET BY MOUTH DAILY.  Dispense: 30 tablet; Refill: 0 - CBC with Differential/Platelet; Future  4. Age related osteoporosis, unspecified pathological fracture presence latest T-score reviewed.no recent falls or fracture - continue with weight bearing exercises  - continue on calcium ,vit D and Boniva   5. Primary osteoarthritis involving multiple joints Extra strength tylenol recommended . - continue on exercises   Family/ staff Communication: Reviewed plan of care with patient verbalized understanding.   Labs/tests ordered:  - CBC with  Differential/Platelet; Future - CMP with eGFR([facility]); Future - TSH; Future - Lipid panel; Future  Next Appointment : 6 months for medical management of chronic issues.Fasting labs prior to visit.   Sandrea Hughs, NP

## 2020-07-10 ENCOUNTER — Telehealth: Payer: Self-pay | Admitting: *Deleted

## 2020-07-10 DIAGNOSIS — I10 Essential (primary) hypertension: Secondary | ICD-10-CM

## 2020-07-10 MED ORDER — LOSARTAN POTASSIUM 100 MG PO TABS: 100.0000 mg | ORAL_TABLET | Freq: Every day | ORAL | 1 refills | Status: DC

## 2020-07-10 NOTE — Telephone Encounter (Signed)
Patient called and stated that she cannot take Hydralazine. Stated that it is causing her blurry vision and Headaches.   Patient has not been taking Losartan either. Stated that a Rx was never sent to her pharmacy so she thought she was to discontinue it. Was NOT taking it when she came to see Dinah on 06/30/2020. Has not taken it in awhile.   Does not know her blood pressure readings because she has no way of taking them. Stated that it was good in office and she wasn't taking the Losartan.   Patient is requesting the Hydralazine to be changed to something different.   Please Advise.

## 2020-07-10 NOTE — Telephone Encounter (Signed)
Please take Losartan as supposed to then follow up in 2 weeks to recheck blood pressure. May d/C hydralazine but could high blood pressure causing headache and vision problems verse hydralazine.

## 2020-07-10 NOTE — Telephone Encounter (Signed)
Patient notified and agreed. Medication list updated and Rx for Losartan sent to pharmacy. Appointment scheduled to follow up April 4 with Dinah.

## 2020-07-12 ENCOUNTER — Other Ambulatory Visit: Payer: Self-pay | Admitting: Family

## 2020-07-12 DIAGNOSIS — I10 Essential (primary) hypertension: Secondary | ICD-10-CM

## 2020-07-12 NOTE — Telephone Encounter (Signed)
Patient pharmacy has sent medication "Losartan 100 mg". Due to medication being on backorder. Medication pend and sent to PCP [name], Donalee Citrin, NP to select alternative medication.

## 2020-07-13 ENCOUNTER — Other Ambulatory Visit: Payer: Self-pay | Admitting: Family

## 2020-07-13 DIAGNOSIS — I10 Essential (primary) hypertension: Secondary | ICD-10-CM

## 2020-07-13 NOTE — Telephone Encounter (Signed)
Pharmacy has sent back medication "Losartan". They state that medication is unavailable so they need you to select something else. Message routed back to PCP [name], Dinah C, NP . Please Advise.

## 2020-07-14 NOTE — Telephone Encounter (Signed)
Replace losartan with irbesartan until losartan dose is available.script send to pharmacy.

## 2020-07-24 ENCOUNTER — Ambulatory Visit (INDEPENDENT_AMBULATORY_CARE_PROVIDER_SITE_OTHER): Payer: Medicare Other | Admitting: Family

## 2020-07-24 ENCOUNTER — Encounter: Payer: Self-pay | Admitting: Family

## 2020-07-24 ENCOUNTER — Other Ambulatory Visit: Payer: Self-pay

## 2020-07-24 DIAGNOSIS — I10 Essential (primary) hypertension: Secondary | ICD-10-CM | POA: Diagnosis not present

## 2020-07-24 MED ORDER — IRBESARTAN 300 MG PO TABS: 300.0000 mg | ORAL_TABLET | Freq: Every day | ORAL | 0 refills | Status: DC

## 2020-07-24 NOTE — Patient Instructions (Signed)
-   check B/p at home and record on log.Notify provider if b/p > 140 /90

## 2020-07-24 NOTE — Progress Notes (Signed)
Provider: Richarda Blade FNP-C  Tameca Jerez, Donalee Citrin, NP  Patient Care Team: Aaylah Pokorny, Donalee Citrin, NP as PCP - General (Family Medicine) Elise Benne, MD as Consulting Physician (Ophthalmology)  Extended Emergency Contact Information Primary Emergency Contact: [name],[name] Address: [address]          [place], [place] [address] [name] of [place] Home Phone: [phone] Mobile Phone: [phone] Relation: Daughter  Code Status:  Full Code  Goals of care: Advanced Directive information Advanced Directives 07/24/2020  Does Patient Have a Medical Advance Directive? Yes  Does patient want to make changes to medical advance directive? No - Patient declined  Would patient like information on creating a medical advance directive? -     Chief Complaint  Patient presents with  . Acute Visit    Follow up Blood Pressure    HPI:  Pt is a 80 y.o. female seen today for an acute visit for evaluation of blood pressure.she brought her blood pressure cuff machine for recalibrating. Has not checked her blood pressure at home. She was prescribed hydralazine 25 mg tablet three times daily on 06/30/2020 but states made her feel bad so she stopped Felt better after stopping Hydralazine. B/p 160/90 today corresponding with her home wrist B/p cuff. Has had no headache,diziness,fatigue,chest tightness,palpitation or chest pain.     Past Medical History:  Diagnosis Date  . GERD (gastroesophageal reflux disease)   . HTN (hypertension)   . Hyperlipidemia   . Insomnia, unspecified   . Obesity, unspecified   . Osteoarthritis   . Other abnormality of red blood cells    Microcytosis  . Peripheral vascular disease, unspecified ([facility])   . Vertigo, benign positional    Head to left side   Past Surgical History:  Procedure Laterality Date  . ABLATION ON ENDOMETRIOSIS  1980  . CHOLECYSTECTOMY, LAPAROSCOPIC  1989  . COLONOSCOPY  2008  . COLONOSCOPY  2013  . TOTAL ABDOMINAL HYSTERECTOMY   1971    Allergies  Allergen Reactions  . Naproxen   . Nitrofuran Derivatives Other (See Comments)    Knots on body  . Propoxyphene     Darvocet  . Azithromycin Other (See Comments)    headache, excessive sleepiness    Outpatient Encounter Medications as of 07/24/2020  Medication Sig  . aspirin 81 MG tablet Take 81 mg by mouth daily.  Marland Kitchen Bioflavonoid Products (ESTER C PO) Take 1 tablet (1000 mg ) by mouth once a day  . CALCIUM CITRATE PO Take 1 tablet (1000 mg ) by mouth daily  . Cholecalciferol 4000 units TABS Take 1 tablet by mouth daily  . Cod Liver Oil CAPS Take 1 tablet by mouth daily  . ibandronate (BONIVA) 150 MG tablet TAKE 1 EVERY 30 DAYS IN THE AM WITH FULL GLASS OF WATER ON EMPTY STOMACH. DO NOT LIE DOWN X30 MINUTE  . ibuprofen (ADVIL) 800 MG tablet Take 1 tablet (800 mg total) by mouth 2 (two) times daily as needed.  . irbesartan (AVAPRO) 150 MG tablet Take 1 tablet (150 mg total) by mouth daily.  . Naphazoline HCl (CLEAR EYES OP) Apply to eye as needed.   . pantoprazole (PROTONIX) 40 MG tablet TAKE 1 TABLET BY MOUTH DAILY.  . pravastatin (PRAVACHOL) 40 MG tablet Take 1 tablet (40 mg total) by mouth daily.   No facility-administered encounter medications on file as of 07/24/2020.    Review of Systems  Constitutional: Negative for appetite change, chills, fatigue and fever.  Respiratory: Negative for  cough, chest tightness, shortness of breath and wheezing.   Cardiovascular: Negative for chest pain, palpitations and leg swelling.  Gastrointestinal: Negative for abdominal distention, abdominal pain, constipation, diarrhea, nausea and vomiting.  Musculoskeletal: Positive for arthralgias. Negative for gait problem and joint swelling.  Skin: Negative for color change, pallor and rash.  Neurological: Negative for dizziness, speech difficulty, weakness, light-headedness, numbness and headaches.  Hematological: Does not bruise/bleed easily.  Psychiatric/Behavioral: Negative for  agitation, confusion and sleep disturbance. The patient is not nervous/anxious.     Immunization History  Administered Date(s) Administered  . Influenza,inj,Quad PF,6+ Mos 02/28/2016  . PFIZER(Purple Top)SARS-COV-2 Vaccination 06/18/2019, 07/13/2019, 02/09/2020  . Pneumococcal Conjugate-13 05/24/2016   Pertinent  Health Maintenance Due  Topic Date Due  . PNA vac Low Risk Adult (2 of 2 - PPSV23) 06/22/2021 (Originally 05/24/2017)  . INFLUENZA VACCINE  11/20/2020  . DEXA SCAN  Completed   Fall Risk  06/30/2020 06/22/2020 06/14/2019 01/22/2019 01/08/2019  Falls in the past year? 0 0 0 0 0  Number falls in past yr: 0 0 0 - -  Injury with Fall? 0 0 0 0 0   Functional Status Survey:    Vitals:   07/24/20 1302  BP: (!) 160/90  Pulse: 79  Resp: 20  Temp: 98 F (36.7 C)  SpO2: 97%  Weight: 195 lb 9.6 oz (88.7 kg)  Height: 5' (1.524 m)   Body mass index is 38.2 kg/m. Physical Exam Vitals reviewed.  Constitutional:      General: She is not in acute distress.    Appearance: She is obese. She is not ill-appearing.  HENT:     Head: Normocephalic.  Eyes:     General: No scleral icterus.       Right eye: No discharge.        Left eye: No discharge.     Conjunctiva/sclera: Conjunctivae normal.     Pupils: Pupils are equal, round, and reactive to light.  Neck:     Vascular: No carotid bruit.  Cardiovascular:     Rate and Rhythm: Normal rate and regular rhythm.     Pulses: Normal pulses.     Heart sounds: Normal heart sounds. No murmur heard. No friction rub. No gallop.   Pulmonary:     Effort: Pulmonary effort is normal. No respiratory distress.     Breath sounds: Normal breath sounds. No wheezing, rhonchi or rales.  Chest:     Chest wall: No tenderness.  Abdominal:     General: Bowel sounds are normal. There is no distension.     Palpations: Abdomen is soft. There is no mass.     Tenderness: There is no abdominal tenderness. There is no right CVA tenderness, left CVA  tenderness, guarding or rebound.  Musculoskeletal:        General: No swelling or tenderness.     Cervical back: Normal range of motion. No rigidity or tenderness.     Right lower leg: No edema.     Left lower leg: No edema.  Lymphadenopathy:     Cervical: No cervical adenopathy.  Skin:    General: Skin is warm and dry.     Coloration: Skin is not pale.     Findings: No erythema.  Neurological:     Mental Status: She is alert and oriented to person, place, and time.     Cranial Nerves: No cranial nerve deficit.     Motor: No weakness.     Gait: Gait normal.  Psychiatric:  Mood and Affect: Mood normal.        Speech: Speech normal.        Behavior: Behavior normal.        Thought Content: Thought content normal.        Judgment: Judgment normal.     Labs reviewed: No results for input(s): NA, K, CL, CO2, GLUCOSE, BUN, CREATININE, CALCIUM, MG, PHOS in the last 8760 hours. No results for input(s): AST, ALT, ALKPHOS, BILITOT, PROT, ALBUMIN in the last 8760 hours. No results for input(s): WBC, NEUTROABS, HGB, HCT, MCV, PLT in the last 8760 hours. Lab Results  Component Value Date   TSH 0.90 07/16/2019   Lab Results  Component Value Date   HGBA1C 5.9 (H) 01/08/2019   Lab Results  Component Value Date   CHOL 196 07/16/2019   HDL 50 07/16/2019   LDLCALC 116 (H) 07/16/2019   TRIG 178 (H) 07/16/2019   CHOLHDL 3.9 07/16/2019    Significant Diagnostic Results in last 30 days:  No results found.  Assessment/Plan  Essential hypertension B/p elevated this visit.Has stopped Hydralazine unclear how long she took. Asymptomatic.Negative exam findings. - Advised to increase Irbesartan from 150 mg tablet to 300 mg tablet daily.may take 2 tablets of the current medication until gone then take one 300 mg tablet when refilled.  - irbesartan (AVAPRO) 300 MG tablet; Take 1 tablet (300 mg total) by mouth daily.  Dispense: 90 tablet; Refill: 0 - recommended 2 weeks follow up for  B/p check but states her sister drives her around and has had lots of doctor visit therefore prefers to follow up in 4 weeks.  - check B/p at home and record on log.Notify provider if b/p > 140 /90  - continue with dietary modification and exercise   Family/ staff Communication: Reviewed plan of care with patient verbalized understanding  Labs/tests ordered: None   Next Appointment: 4 weeks for B/p follow up   Caesar Bookman, NP

## 2020-07-25 ENCOUNTER — Other Ambulatory Visit: Payer: Self-pay | Admitting: Family

## 2020-07-25 DIAGNOSIS — K219 Gastro-esophageal reflux disease without esophagitis: Secondary | ICD-10-CM

## 2020-08-28 ENCOUNTER — Encounter: Payer: Self-pay | Admitting: Family

## 2020-08-28 ENCOUNTER — Other Ambulatory Visit: Payer: Self-pay

## 2020-08-28 ENCOUNTER — Ambulatory Visit (INDEPENDENT_AMBULATORY_CARE_PROVIDER_SITE_OTHER): Payer: Medicare Other | Admitting: Family

## 2020-08-28 VITALS — BP 142/68 | HR 66 | Temp 97.3°F | Resp 18 | Ht 60.0 in | Wt 192.6 lb

## 2020-08-28 DIAGNOSIS — K219 Gastro-esophageal reflux disease without esophagitis: Secondary | ICD-10-CM | POA: Diagnosis not present

## 2020-08-28 DIAGNOSIS — I10 Essential (primary) hypertension: Secondary | ICD-10-CM | POA: Diagnosis not present

## 2020-08-28 MED ORDER — PANTOPRAZOLE SODIUM 40 MG PO TBEC: DELAYED_RELEASE_TABLET | ORAL | 5 refills | Status: DC

## 2020-08-28 NOTE — Patient Instructions (Signed)
Gastroesophageal Reflux Disease, Adult  Gastroesophageal reflux (GER) happens when acid from the stomach flows up into the tube that connects the mouth and the stomach (esophagus). Normally, food travels down the esophagus and stays in the stomach to be digested. With GER, food and stomach acid sometimes move back up into the esophagus. You may have a disease called gastroesophageal reflux disease (GERD) if the reflux:  Happens often.  Causes frequent or very bad symptoms.  Causes problems such as damage to the esophagus. When this happens, the esophagus becomes sore and swollen. Over time, GERD can make small holes (ulcers) in the lining of the esophagus. What are the causes? This condition is caused by a problem with the muscle between the esophagus and the stomach. When this muscle is weak or not normal, it does not close properly to keep food and acid from coming back up from the stomach. The muscle can be weak because of:  Tobacco use.  Pregnancy.  Having a certain type of hernia (hiatal hernia).  Alcohol use.  Certain foods and drinks, such as coffee, chocolate, onions, and peppermint. What increases the risk?  Being overweight.  Having a disease that affects your connective tissue.  Taking NSAIDs, such a ibuprofen. What are the signs or symptoms?  Heartburn.  Difficult or painful swallowing.  The feeling of having a lump in the throat.  A bitter taste in the mouth.  Bad breath.  Having a lot of saliva.  Having an upset or bloated stomach.  Burping.  Chest pain. Different conditions can cause chest pain. Make sure you see your doctor if you have chest pain.  Shortness of breath or wheezing.  A long-term cough or a cough at night.  Wearing away of the surface of teeth (tooth enamel).  Weight loss. How is this treated?  Making changes to your diet.  Taking medicine.  Having surgery. Treatment will depend on how bad your symptoms are. Follow these  instructions at home: Eating and drinking  Follow a diet as told by your doctor. You may need to avoid foods and drinks such as: ? Coffee and tea, with or without caffeine. ? Drinks that contain alcohol. ? Energy drinks and sports drinks. ? Bubbly (carbonated) drinks or sodas. ? Chocolate and cocoa. ? Peppermint and mint flavorings. ? Garlic and onions. ? Horseradish. ? Spicy and acidic foods. These include peppers, chili powder, curry powder, vinegar, hot sauces, and BBQ sauce. ? Citrus fruit juices and citrus fruits, such as oranges, lemons, and limes. ? Tomato-based foods. These include red sauce, chili, salsa, and pizza with red sauce. ? Fried and fatty foods. These include donuts, french fries, potato chips, and high-fat dressings. ? High-fat meats. These include hot dogs, rib eye steak, sausage, ham, and bacon. ? High-fat dairy items, such as whole milk, butter, and cream cheese.  Eat small meals often. Avoid eating large meals.  Avoid drinking large amounts of liquid with your meals.  Avoid eating meals during the 2-3 hours before bedtime.  Avoid lying down right after you eat.  Do not exercise right after you eat.   Lifestyle  Do not smoke or use any products that contain nicotine or tobacco. If you need help quitting, ask your doctor.  Try to lower your stress. If you need help doing this, ask your doctor.  If you are overweight, lose an amount of weight that is healthy for you. Ask your doctor about a safe weight loss goal.   General instructions    Pay attention to any changes in your symptoms.  Take over-the-counter and prescription medicines only as told by your doctor.  Do not take aspirin, ibuprofen, or other NSAIDs unless your doctor says it is okay.  Wear loose clothes. Do not wear anything tight around your waist.  Raise (elevate) the head of your bed about 6 inches (15 cm). You may need to use a wedge to do this.  Avoid bending over if this makes your  symptoms worse.  Keep all follow-up visits. Contact a doctor if:  You have new symptoms.  You lose weight and you do not know why.  You have trouble swallowing or it hurts to swallow.  You have wheezing or a cough that keeps happening.  You have a hoarse voice.  Your symptoms do not get better with treatment. Get help right away if:  You have sudden pain in your arms, neck, jaw, teeth, or back.  You suddenly feel sweaty, dizzy, or light-headed.  You have chest pain or shortness of breath.  You vomit and the vomit is green, yellow, or black, or it looks like blood or coffee grounds.  You faint.  Your poop (stool) is red, bloody, or black.  You cannot swallow, drink, or eat. These symptoms may represent a serious problem that is an emergency. Do not wait to see if the symptoms will go away. Get medical help right away. Call your local emergency services (911 in the [place] Do not drive yourself to the hospital. Summary  If a person has gastroesophageal reflux disease (GERD), food and stomach acid move back up into the esophagus and cause symptoms or problems such as damage to the esophagus.  Treatment will depend on how bad your symptoms are.  Follow a diet as told by your doctor.  Take all medicines only as told by your doctor. This information is not intended to replace advice given to you by your health care provider. Make sure you discuss any questions you have with your health care provider. Document Revised: 10/18/2019 Document Reviewed: 10/18/2019 Elsevier Patient Education  2021 Elsevier Inc.  

## 2020-08-28 NOTE — Progress Notes (Signed)
Provider: Richarda Blade FNP-C  Rico Massar, Donalee Citrin, NP  Patient Care Team: Irfan Veal, Donalee Citrin, NP as PCP - General (Family Medicine) Elise Benne, MD as Consulting Physician (Ophthalmology)  Extended Emergency Contact Information Primary Emergency Contact: [name],[name] Address: [address]          [place], [place] [address] [name] Home Phone: [phone] Mobile Phone: [phone] Relation: Daughter  Code Status: Full Code  Goals of care: Advanced Directive information Advanced Directives 08/28/2020  Does Patient Have a Medical Advance Directive? No  Does patient want to make changes to medical advance directive? No - Patient declined  Would patient like information on creating a medical advance directive? -     Chief Complaint  Patient presents with  . Medical Management of Chronic Issues    4 week follow up. Patient complains of increased heartburn and is requesting medication for it.    HPI:  Pt is a 80 y.o. female seen today for an acute visit for follow up high blood pressure.she states her blood pressure machine gives higher readings.she checked in [employer] and readings correlate with office reading today. denies any headache,dizziness,vision changes,fatigue,chest tightness,palpitation,chest pain or shortness of breath.    She complains of increased heartburn.Has been taking Tums which helps some.Has identified some foods which aggravate symptoms.Has not been taking her Protonix.   Past Medical History:  Diagnosis Date  . GERD (gastroesophageal reflux disease)   . HTN (hypertension)   . Hyperlipidemia   . Insomnia, unspecified   . Obesity, unspecified   . Osteoarthritis   . Other abnormality of red blood cells    Microcytosis  . Peripheral vascular disease, unspecified (HCC)   . Vertigo, benign positional    Head to left side   Past Surgical History:  Procedure Laterality Date  . ABLATION ON ENDOMETRIOSIS  1980  . CHOLECYSTECTOMY,  LAPAROSCOPIC  1989  . COLONOSCOPY  2008  . COLONOSCOPY  2013  . TOTAL ABDOMINAL HYSTERECTOMY  1971    Allergies  Allergen Reactions  . Naproxen   . Nitrofuran Derivatives Other (See Comments)    Knots on body  . Propoxyphene     Darvocet  . Azithromycin Other (See Comments)    headache, excessive sleepiness    Outpatient Encounter Medications as of 08/28/2020  Medication Sig  . aspirin 81 MG tablet Take 81 mg by mouth daily.  Marland Kitchen Bioflavonoid Products (ESTER C PO) Take 1 tablet (1000 mg ) by mouth once a day  . CALCIUM CITRATE PO Take 1 tablet (1000 mg ) by mouth daily  . Cholecalciferol 4000 units TABS Take 1 tablet by mouth daily  . Cod Liver Oil CAPS Take 1 tablet by mouth daily  . ibandronate (BONIVA) 150 MG tablet TAKE 1 EVERY 30 DAYS IN THE AM WITH FULL GLASS OF WATER ON EMPTY STOMACH. DO NOT LIE DOWN X30 MINUTE  . ibuprofen (ADVIL) 800 MG tablet Take 1 tablet (800 mg total) by mouth 2 (two) times daily as needed.  . irbesartan (AVAPRO) 300 MG tablet Take 1 tablet (300 mg total) by mouth daily.  . Naphazoline HCl (CLEAR EYES OP) Apply to eye as needed.   . pantoprazole (PROTONIX) 40 MG tablet TAKE 1 TABLET BY MOUTH DAILY.  . pravastatin (PRAVACHOL) 40 MG tablet Take 1 tablet (40 mg total) by mouth daily.   No facility-administered encounter medications on file as of 08/28/2020.    Review of Systems  Constitutional: Negative for appetite change, chills, fatigue, fever  and unexpected weight change.  Eyes: Negative for pain, discharge, redness, itching and visual disturbance.  Respiratory: Negative for cough, chest tightness, shortness of breath and wheezing.   Cardiovascular: Negative for chest pain, palpitations and leg swelling.  Gastrointestinal: Negative for abdominal distention, abdominal pain, blood in stool, constipation, diarrhea, nausea and vomiting.  Genitourinary: Negative for difficulty urinating, dysuria, flank pain, frequency and urgency.  Musculoskeletal:  Negative for arthralgias, back pain, gait problem, joint swelling, myalgias, neck pain and neck stiffness.  Skin: Negative for color change, pallor, rash and wound.  Neurological: Negative for dizziness, syncope, speech difficulty, weakness, light-headedness, numbness and headaches.  Hematological: Does not bruise/bleed easily.    Immunization History  Administered Date(s) Administered  . Influenza,inj,Quad PF,6+ Mos 02/28/2016  . PFIZER(Purple Top)SARS-COV-2 Vaccination 06/18/2019, 07/13/2019, 02/09/2020  . Pneumococcal Conjugate-13 05/24/2016   Pertinent  Health Maintenance Due  Topic Date Due  . PNA vac Low Risk Adult (2 of 2 - PPSV23) 06/22/2021 (Originally 05/24/2017)  . INFLUENZA VACCINE  11/20/2020  . DEXA SCAN  Completed   Fall Risk  08/28/2020 06/30/2020 06/22/2020 06/14/2019 01/22/2019  Falls in the past year? 0 0 0 0 0  Number falls in past yr: 0 0 0 0 -  Injury with Fall? 0 0 0 0 0   Functional Status Survey:    Vitals:   08/28/20 1313  BP: (!) 142/68  Pulse: 66  Resp: 18  Temp: (!) 97.3 F (36.3 C)  TempSrc: Temporal  SpO2: 97%  Weight: 192 lb 9.6 oz (87.4 kg)  Height: 5' (1.524 m)   Body mass index is 37.61 kg/m. Physical Exam Vitals reviewed.  Constitutional:      General: She is not in acute distress.    Appearance: Normal appearance. She is obese. She is not ill-appearing or diaphoretic.  HENT:     Head: Normocephalic.     Mouth/Throat:     Mouth: Mucous membranes are moist.     Pharynx: Oropharynx is clear. No oropharyngeal exudate or posterior oropharyngeal erythema.  Eyes:     General: No scleral icterus.       Right eye: No discharge.        Left eye: No discharge.     Extraocular Movements: Extraocular movements intact.     Conjunctiva/sclera: Conjunctivae normal.     Pupils: Pupils are equal, round, and reactive to light.  Neck:     Vascular: No carotid bruit.  Cardiovascular:     Rate and Rhythm: Normal rate and regular rhythm.     Pulses:  Normal pulses.     Heart sounds: Normal heart sounds. No murmur heard. No friction rub. No gallop.   Pulmonary:     Effort: Pulmonary effort is normal. No respiratory distress.     Breath sounds: Normal breath sounds. No wheezing, rhonchi or rales.  Chest:     Chest wall: No tenderness.  Abdominal:     General: Bowel sounds are normal. There is no distension.     Palpations: Abdomen is soft. There is no mass.     Tenderness: There is no abdominal tenderness. There is no right CVA tenderness, left CVA tenderness, guarding or rebound.  Musculoskeletal:        General: No swelling or tenderness. Normal range of motion.     Cervical back: Normal range of motion. No rigidity or tenderness.     Right lower leg: No edema.     Left lower leg: No edema.  Lymphadenopathy:  Cervical: No cervical adenopathy.  Skin:    General: Skin is warm and dry.     Coloration: Skin is not pale.     Findings: No bruising, erythema, lesion or rash.  Neurological:     Mental Status: She is alert and oriented to person, place, and time.     Cranial Nerves: No cranial nerve deficit.     Motor: No weakness.     Coordination: Coordination normal.     Gait: Gait normal.  Psychiatric:        Mood and Affect: Mood normal.        Speech: Speech normal.        Behavior: Behavior normal.        Thought Content: Thought content normal.        Judgment: Judgment normal.    Labs reviewed: No results for input(s): NA, K, CL, CO2, GLUCOSE, BUN, CREATININE, CALCIUM, MG, PHOS in the last 8760 hours. No results for input(s): AST, ALT, ALKPHOS, BILITOT, PROT, ALBUMIN in the last 8760 hours. No results for input(s): WBC, NEUTROABS, HGB, HCT, MCV, PLT in the last 8760 hours. Lab Results  Component Value Date   TSH 0.90 07/16/2019   Lab Results  Component Value Date   HGBA1C 5.9 (H) 01/08/2019   Lab Results  Component Value Date   CHOL 196 07/16/2019   HDL 50 07/16/2019   LDLCALC 116 (H) 07/16/2019   TRIG 178  (H) 07/16/2019   CHOLHDL 3.9 07/16/2019    Significant Diagnostic Results in last 30 days:  No results found.  Assessment/Plan  1. Gastroesophageal reflux disease without esophagitis Has worsen. - advised to avoid aggravating foods.Avoid eating meals late in the evening prior to bed time.  - pantoprazole (PROTONIX) 40 MG tablet; TAKE 1 TABLET BY MOUTH DAILY.  Dispense: 30 tablet; Refill: 5 - Additional information provided on AVS   2. Essential hypertension B/p has improved  - continue on irbesartan  - continue on ASA - Advised to check Blood pressure at home and record on log provided and notify provider if B/p > 140/90   Family/ staff Communication: Reviewed plan of care with patient verbalized understanding.   Labs/tests ordered: None   Next Appointment: As needed if symptoms worsen or fail to improve    Caesar Bookman, NP

## 2020-09-04 ENCOUNTER — Ambulatory Visit: Payer: Medicare Other | Admitting: Family

## 2020-10-09 ENCOUNTER — Other Ambulatory Visit: Payer: Self-pay | Admitting: Family

## 2020-10-09 DIAGNOSIS — I10 Essential (primary) hypertension: Secondary | ICD-10-CM

## 2020-10-20 ENCOUNTER — Other Ambulatory Visit: Payer: Self-pay | Admitting: Family

## 2020-10-20 DIAGNOSIS — I10 Essential (primary) hypertension: Secondary | ICD-10-CM

## 2020-12-05 ENCOUNTER — Other Ambulatory Visit (INDEPENDENT_AMBULATORY_CARE_PROVIDER_SITE_OTHER): Payer: Self-pay | Admitting: Otolaryngology

## 2020-12-08 ENCOUNTER — Telehealth: Payer: Medicare Other | Admitting: *Deleted

## 2020-12-08 MED ORDER — FLUTICASONE PROPIONATE 50 MCG/ACT NA SUSP: 2.0000 | Freq: Every day | NASAL | 1 refills | Status: DC

## 2020-12-08 NOTE — Telephone Encounter (Signed)
Medication list updated and Rx sent to pharmacy  

## 2020-12-08 NOTE — Telephone Encounter (Signed)
Add Flonase 50 mcg nasal spray instil 2 sprays into each nares

## 2020-12-08 NOTE — Telephone Encounter (Signed)
Patient called requesting refill on her nasal spray Fluticasone .   Medication is not in current medication list. Is this ok to add and send for refill.   Please Advise.

## 2020-12-26 ENCOUNTER — Other Ambulatory Visit: Payer: Self-pay | Admitting: Family

## 2020-12-26 DIAGNOSIS — E782 Mixed hyperlipidemia: Secondary | ICD-10-CM

## 2020-12-26 DIAGNOSIS — I10 Essential (primary) hypertension: Secondary | ICD-10-CM

## 2020-12-29 ENCOUNTER — Other Ambulatory Visit: Payer: Medicare Other

## 2021-01-01 ENCOUNTER — Ambulatory Visit (INDEPENDENT_AMBULATORY_CARE_PROVIDER_SITE_OTHER): Payer: Medicare Other | Admitting: Family

## 2021-01-01 ENCOUNTER — Encounter: Payer: Self-pay | Admitting: Family

## 2021-01-01 ENCOUNTER — Other Ambulatory Visit: Payer: Self-pay

## 2021-01-01 VITALS — BP 138/74 | HR 71 | Temp 98.2°F | Resp 16 | Ht 60.0 in | Wt 187.0 lb

## 2021-01-01 DIAGNOSIS — E782 Mixed hyperlipidemia: Secondary | ICD-10-CM

## 2021-01-01 DIAGNOSIS — M159 Polyosteoarthritis, unspecified: Secondary | ICD-10-CM

## 2021-01-01 DIAGNOSIS — M8949 Other hypertrophic osteoarthropathy, multiple sites: Secondary | ICD-10-CM | POA: Diagnosis not present

## 2021-01-01 DIAGNOSIS — I10 Essential (primary) hypertension: Secondary | ICD-10-CM

## 2021-01-01 DIAGNOSIS — R197 Diarrhea, unspecified: Secondary | ICD-10-CM

## 2021-01-01 DIAGNOSIS — K219 Gastro-esophageal reflux disease without esophagitis: Secondary | ICD-10-CM | POA: Diagnosis not present

## 2021-01-01 LAB — CBC WITH DIFFERENTIAL/PLATELET
Absolute Monocytes: 840 cells/uL (ref 200–950)
Basophils Absolute: 32 cells/uL (ref 0–200)
Basophils Relative: 0.3 %
Eosinophils Absolute: 147 cells/uL (ref 15–500)
Eosinophils Relative: 1.4 %
HCT: 38.4 % (ref 35.0–45.0)
Hemoglobin: 11.8 g/dL (ref 11.7–15.5)
Lymphs Abs: 2058 cells/uL (ref 850–3900)
MCH: 23.2 pg — ABNORMAL LOW (ref 27.0–33.0)
MCHC: 30.7 g/dL — ABNORMAL LOW (ref 32.0–36.0)
MCV: 75.4 fL — ABNORMAL LOW (ref 80.0–100.0)
MPV: 9.8 fL (ref 7.5–12.5)
Monocytes Relative: 8 %
Neutro Abs: 7424 cells/uL (ref 1500–7800)
Neutrophils Relative %: 70.7 %
Platelets: 376 10*3/uL (ref 140–400)
RBC: 5.09 10*6/uL (ref 3.80–5.10)
RDW: 14.4 % (ref 11.0–15.0)
Total Lymphocyte: 19.6 %
WBC: 10.5 10*3/uL (ref 3.8–10.8)

## 2021-01-01 LAB — COMPLETE METABOLIC PANEL WITH GFR
AG Ratio: 1.5 (calc) (ref 1.0–2.5)
ALT: 15 U/L (ref 6–29)
AST: 18 U/L (ref 10–35)
Albumin: 4.4 g/dL (ref 3.6–5.1)
Alkaline phosphatase (APISO): 63 U/L (ref 37–153)
BUN: 12 mg/dL (ref 7–25)
CO2: 24 mmol/L (ref 20–32)
Calcium: 9.6 mg/dL (ref 8.6–10.4)
Chloride: 104 mmol/L (ref 98–110)
Creat: 0.62 mg/dL (ref 0.60–0.95)
Globulin: 2.9 g/dL (calc) (ref 1.9–3.7)
Glucose, Bld: 89 mg/dL (ref 65–139)
Potassium: 3.6 mmol/L (ref 3.5–5.3)
Sodium: 138 mmol/L (ref 135–146)
Total Bilirubin: 0.4 mg/dL (ref 0.2–1.2)
Total Protein: 7.3 g/dL (ref 6.1–8.1)
eGFR: 89 mL/min/{1.73_m2} (ref >=60)

## 2021-01-01 LAB — LIPID PANEL
Cholesterol: 183 mg/dL (ref <200)
HDL: 53 mg/dL (ref >=50)
LDL Cholesterol (Calc): 106 mg/dL (calc) — ABNORMAL HIGH
Non-HDL Cholesterol (Calc): 130 mg/dL (calc) — ABNORMAL HIGH (ref <130)
Total CHOL/HDL Ratio: 3.5 (calc) (ref <5.0)
Triglycerides: 125 mg/dL (ref <150)

## 2021-01-01 LAB — TSH: TSH: 1.47 mIU/L (ref 0.40–4.50)

## 2021-01-01 MED ORDER — IRBESARTAN 300 MG PO TABS: 300.0000 mg | ORAL_TABLET | Freq: Every day | ORAL | 1 refills | Status: DC

## 2021-01-01 MED ORDER — PRAVASTATIN SODIUM 40 MG PO TABS: 40.0000 mg | ORAL_TABLET | Freq: Every day | ORAL | 1 refills | Status: DC

## 2021-01-01 MED ORDER — PANTOPRAZOLE SODIUM 40 MG PO TBEC: DELAYED_RELEASE_TABLET | ORAL | 5 refills | Status: DC

## 2021-01-01 NOTE — Patient Instructions (Signed)
Bland Diet A bland diet consists of foods that are often soft and do not have a lot of fat, fiber, or extra seasonings. Foods without fat, fiber, or seasoning are easier for the body to digest. They are also less likely to irritate your mouth, throat, stomach, and other parts of your digestive system. A bland dietis sometimes called a BRAT diet. What is my plan? Your health care provider or food and nutrition specialist (dietitian) may recommend specific changes to your diet to prevent symptoms or to treat your symptoms. These changes may include: Eating small meals often. Cooking food until it is soft enough to chew easily. Chewing your food well. Drinking fluids slowly. Not eating foods that are very spicy, sour, or fatty. Not eating citrus fruits, such as oranges and grapefruit. What do I need to know about this diet? Eat a variety of foods from the bland diet food list. Do not follow a bland diet longer than needed. Ask your health care provider whether you should take vitamins or supplements. What foods can I eat? Grains  Hot cereals, such as cream of wheat. Rice. Bread, crackers, or tortillas madefrom refined white flour. Vegetables Canned or cooked vegetables. Mashed or boiled potatoes. Fruits  Bananas. Applesauce. Other types of cooked or canned fruit with the skin andseeds removed, such as canned peaches or pears. Meats and other proteins  Scrambled eggs. Creamy peanut butter or other nut butters. Lean, well-cookedmeats, such as chicken or fish. Tofu. Soups or broths. Dairy Low-fat dairy products, such as milk, cottage cheese, or yogurt. Beverages  Water. Herbal tea. Apple juice. Fats and oils Mild salad dressings. Canola or olive oil. Sweets and desserts Pudding. Custard. Fruit gelatin. Ice cream. The items listed above may not be a complete list of recommended foods and beverages. Contact a dietitian for more options. What foods are not recommended? Grains Whole grain  breads and cereals. Vegetables Raw vegetables. Fruits Raw fruits, especially citrus, berries, or dried fruits. Dairy Whole fat dairy foods. Beverages Caffeinated drinks. Alcohol. Seasonings and condiments Strongly flavored seasonings or condiments. Hot sauce. Salsa. Other foods Spicy foods. Fried foods. Sour foods, such as pickled or fermented foods. Foodswith high sugar content. Foods high in fiber. The items listed above may not be a complete list of foods and beverages to avoid. Contact a dietitian for more information. Summary A bland diet consists of foods that are often soft and do not have a lot of fat, fiber, or extra seasonings. Foods without fat, fiber, or seasoning are easier for the body to digest. Check with your health care provider to see how long you should follow this diet plan. It is not meant to be followed for long periods. This information is not intended to replace advice given to you by your health care provider. Make sure you discuss any questions you have with your healthcare provider. Document Revised: 05/07/2017 Document Reviewed: 05/07/2017 Elsevier Patient Education  2022 [employer] Inc.  

## 2021-01-01 NOTE — Progress Notes (Signed)
Provider: Marlowe Sax FNP-C   Breean Nannini, Nelda Bucks, NP  Patient Care Team: Cleone Hulick, Nelda Bucks, NP as PCP - General (Family Medicine) Sharyne Peach, MD as Consulting Physician (Ophthalmology)  Extended Emergency Contact Information Primary Emergency Contact: [name],[name] Address: [address] [place],  [address] [place] Phone: [phone] Mobile Phone: [phone] Relation: Daughter  Code Status:  Full Code  Goals of care: Advanced Directive information Advanced Directives 12/29/2020  Does Patient Have a Medical Advance Directive? No  Does patient want to make changes to medical advance directive? -  Would patient like information on creating a medical advance directive? No - Patient declined     Chief Complaint  Patient presents with   Medical Management of Chronic Issues    6 month follow up   Immunizations    Discuss the need for shingrix vaccine, influenza vaccine, and 2nd covid booster.     HPI:  Pt is a 80 y.o. female seen today for 6 months for medical management of chronic diseases.   Had diarrhea since 12/29/2020 which has improved with imodium. No cramping or N/V. States daughter bought some rice with broccoli that was so greasy.  Has schedule with Dr.Jones for lower back pain.was advised to call back if pain worsen states MRI will be done. Left knee bothers her gives out and pops in place.follows up with Orthopedic. Takes Ibuprofen which helps with the pain. Continues to go to the Gym 3-4 times walking,pulling weights,rowing machine. No weight fall episode. Has lost 6 lbs over 4 months.Describes Appetite as good.Does not eat out or fast food.  Sometimes has to use a Tum or two for acid reflux.  Declined Shingrix,Tdap,PNA and Influenza vaccine. States will get her COVID-19 2 nd booster vaccine.    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   CHOLECYSTECTOMY, LAPAROSCOPIC  1989   COLONOSCOPY  2008   COLONOSCOPY  2013   TOTAL ABDOMINAL HYSTERECTOMY  1971    Allergies  Allergen Reactions   Naproxen    Nitrofuran Derivatives Other (See Comments)    Knots on body   Propoxyphene     Darvocet   Azithromycin Other (See Comments)    headache, excessive sleepiness    Allergies as of 01/01/2021       Reactions   Naproxen    Nitrofuran Derivatives Other (See Comments)   Knots on body   Propoxyphene    Darvocet   Azithromycin Other (See Comments)   headache, excessive sleepiness        Medication List        Accurate as of January 01, 2021  1:30 PM. If you have any questions, ask your nurse or doctor.          aspirin 81 MG tablet Take 81 mg by mouth daily.   CALCIUM CITRATE PO Take 1 tablet (1000 mg ) by mouth daily   Cholecalciferol 100 MCG (4000 UT) Tabs Take 1 tablet by mouth daily  CLEAR EYES OP Apply to eye as needed.   Cod Liver Oil Caps Take 1 tablet by mouth daily   ESTER C PO Take 1 tablet (1000 mg ) by mouth once a day   fluticasone 50 MCG/ACT nasal spray Commonly known as: FLONASE Place 2 sprays into both nostrils daily.   ibandronate 150 MG tablet Commonly known as: BONIVA TAKE 1 EVERY 30 DAYS IN THE AM WITH FULL GLASS OF WATER ON EMPTY STOMACH. DO NOT LIE DOWN X30 MINUTE   ibuprofen 800 MG tablet Commonly known as: ADVIL Take 1 tablet (800 mg total) by mouth 2 (two) times daily as needed.   irbesartan 300 MG tablet Commonly known as: Avapro Take 1 tablet (300 mg total) by mouth daily.   irbesartan 150 MG tablet Commonly known as: AVAPRO TAKE 1 TABLET BY  MOUTH EVERY DAY   pantoprazole 40 MG tablet Commonly known as: PROTONIX TAKE 1 TABLET BY MOUTH DAILY.   pravastatin 40 MG tablet Commonly known as: PRAVACHOL Take 1 tablet (40 mg total) by mouth daily.        Review of Systems  Constitutional:  Negative for appetite change, chills, fatigue, fever and unexpected weight change.  HENT:  Positive for tinnitus. Negative for congestion, dental problem, ear discharge, ear pain, facial swelling, hearing loss, nosebleeds, postnasal drip, rhinorrhea, sinus pressure, sinus pain, sneezing, sore throat and trouble swallowing.   Eyes:  Positive for visual disturbance. Negative for pain, discharge, redness and itching.       Wears eye glasses   Respiratory:  Negative for cough, chest tightness, shortness of breath and wheezing.   Cardiovascular:  Negative for chest pain, palpitations and leg swelling.  Gastrointestinal:  Positive for diarrhea. Negative for abdominal distention, abdominal pain, constipation, nausea and vomiting.  Endocrine: Negative for cold intolerance, heat intolerance, polydipsia, polyphagia and polyuria.  Genitourinary:  Negative for difficulty urinating, dysuria, flank pain, frequency and urgency.  Musculoskeletal:  Positive for arthralgias and back pain. Negative for gait problem, joint swelling, myalgias, neck pain and neck stiffness.  Skin:  Negative for color change, pallor, rash and wound.  Neurological:  Negative for dizziness, syncope, speech difficulty, weakness, light-headedness, numbness and headaches.  Hematological:  Does not bruise/bleed easily.  Psychiatric/Behavioral:  Negative for agitation, behavioral problems, confusion, hallucinations, self-injury, sleep disturbance and suicidal ideas. The patient is not nervous/anxious.    Immunization History  Administered Date(s) Administered   Influenza,inj,Quad PF,6+ Mos 02/28/2016   PFIZER(Purple Top)SARS-COV-2 Vaccination 06/18/2019, 07/13/2019, 02/09/2020    Pneumococcal Conjugate-13 05/24/2016   Pertinent  Health Maintenance Due  Topic Date Due   INFLUENZA VACCINE  11/20/2020   PNA vac Low Risk Adult (2 of 2 - PPSV23) 06/22/2021 (Originally 05/24/2017)   DEXA SCAN  Completed   Fall Risk  12/29/2020 08/28/2020 06/30/2020 06/22/2020 06/14/2019  Falls in the past year? 0 0 0 0 0  Number falls in past yr: 0 0 0 0 0  Injury with Fall? 0 0 0 0 0  Risk for fall due to : No Fall Risks - - - -  Follow up Falls evaluation completed - - - -   Functional Status Survey:    Vitals:   01/01/21 1322  BP: 138/74  Pulse: 71  Resp: 16  Temp: 98.2 F (36.8 C)  SpO2: 96%  Weight: 187 lb (84.8 kg)  Height: 5' (1.524 m)   Body mass index is 36.52 kg/m. Physical Exam Vitals reviewed.  Constitutional:      General: She is  not in acute distress.    Appearance: Normal appearance. She is normal weight. She is not ill-appearing or diaphoretic.  HENT:     Head: Normocephalic.     Right Ear: Tympanic membrane, ear canal and external ear normal. There is no impacted cerumen.     Left Ear: Tympanic membrane, ear canal and external ear normal. There is no impacted cerumen.     Nose: Nose normal. No congestion or rhinorrhea.     Mouth/Throat:     Mouth: Mucous membranes are moist.     Pharynx: Oropharynx is clear. No oropharyngeal exudate or posterior oropharyngeal erythema.  Eyes:     General: No scleral icterus.       Right eye: No discharge.        Left eye: No discharge.     Extraocular Movements: Extraocular movements intact.     Conjunctiva/sclera: Conjunctivae normal.     Pupils: Pupils are equal, round, and reactive to light.     Comments: Corrective lens in place   Neck:     Vascular: No carotid bruit.  Cardiovascular:     Rate and Rhythm: Normal rate and regular rhythm.     Pulses: Normal pulses.     Heart sounds: Normal heart sounds. No murmur heard.   No friction rub. No gallop.  Pulmonary:     Effort: Pulmonary effort is normal. No  respiratory distress.     Breath sounds: Normal breath sounds. No wheezing, rhonchi or rales.  Chest:     Chest wall: No tenderness.  Abdominal:     General: Bowel sounds are normal. There is no distension.     Palpations: Abdomen is soft. There is no mass.     Tenderness: There is no abdominal tenderness. There is no right CVA tenderness, left CVA tenderness, guarding or rebound.  Musculoskeletal:        General: No swelling or tenderness. Normal range of motion.     Cervical back: Normal range of motion. No rigidity or tenderness.     Right lower leg: No edema.     Left lower leg: No edema.  Lymphadenopathy:     Cervical: No cervical adenopathy.  Skin:    General: Skin is warm and dry.     Coloration: Skin is not pale.     Findings: No bruising, erythema, lesion or rash.  Neurological:     Mental Status: She is alert and oriented to person, place, and time.     Cranial Nerves: No cranial nerve deficit.     Sensory: No sensory deficit.     Motor: No weakness.     Coordination: Coordination normal.     Gait: Gait normal.  Psychiatric:        Mood and Affect: Mood normal.        Speech: Speech normal.        Behavior: Behavior normal.        Thought Content: Thought content normal.        Judgment: Judgment normal.    Labs reviewed: No results for input(s): NA, K, CL, CO2, GLUCOSE, BUN, CREATININE, CALCIUM, MG, PHOS in the last 8760 hours. No results for input(s): AST, ALT, ALKPHOS, BILITOT, PROT, ALBUMIN in the last 8760 hours. No results for input(s): WBC, NEUTROABS, HGB, HCT, MCV, PLT in the last 8760 hours. Lab Results  Component Value Date   TSH 0.90 07/16/2019   Lab Results  Component Value Date   HGBA1C 5.9 (H) 01/08/2019   Lab  Results  Component Value Date   CHOL 196 07/16/2019   HDL 50 07/16/2019   LDLCALC 116 (H) 07/16/2019   TRIG 178 (H) 07/16/2019   CHOLHDL 3.9 07/16/2019    Significant Diagnostic Results in last 30 days:  No results  found.  Assessment/Plan 1. Essential hypertension B/p well controlled  Continue on irbesartan  - irbesartan (AVAPRO) 300 MG tablet; Take 1 tablet (300 mg total) by mouth daily.  Dispense: 90 tablet; Refill: 1 - TSH - CMP with eGFR([facility]) - CBC with Differential/Platelet  2. Mixed hyperlipidemia LDL not at goal  Continue on Pravastatin  - Dietary modification and exercise at least 3 times per week for 30 minutes advised. - pravastatin (PRAVACHOL) 40 MG tablet; Take 1 tablet (40 mg total) by mouth daily.  Dispense: 90 tablet; Refill: 1 - Lipid panel  3. Gastroesophageal reflux disease without esophagitis Symptoms stable  - pantoprazole (PROTONIX) 40 MG tablet; TAKE 1 TABLET BY MOUTH DAILY.  Dispense: 30 tablet; Refill: 5 - CBC with Differential/Platelet  4. Primary osteoarthritis involving multiple joints Continue on current pain regimen  Continue to follow up with Orthopedic   5. Diarrhea, unspecified type Thinks from ingestion of food bought by daughter which was greasy. Diarrhea has improved on imodium. Advised to notify provider if symptoms worsen or not resolved.will culture stool.  - encouraged hydration   Family/ staff Communication: Reviewed plan of care with patient verbalized hydration   Labs/tests ordered:  - CBC with Differential/Platelet - CMP with eGFR([facility]) - TSH - Lipid panel  Next Appointment : 6 months for medical management of chronic issues.Fasting Labs prior to visit.    Sandrea Hughs, NP

## 2021-01-02 ENCOUNTER — Other Ambulatory Visit: Payer: Self-pay | Admitting: Family

## 2021-01-18 ENCOUNTER — Other Ambulatory Visit: Payer: Self-pay | Admitting: Family

## 2021-01-18 DIAGNOSIS — I10 Essential (primary) hypertension: Secondary | ICD-10-CM

## 2021-02-05 ENCOUNTER — Telehealth: Payer: Self-pay | Admitting: *Deleted

## 2021-02-05 NOTE — Telephone Encounter (Signed)
May reduce irbesartan to 150 mg tablet daily.will need to come to the office to evaluate blood pressure in 2 weeks after reducing irbesartan.

## 2021-02-05 NOTE — Telephone Encounter (Signed)
Patient called and stated that she would like to go back to the Irbesartan 150mg  her old dosage.   Stated that she cannot tolerate the Irbesartan 300mg , states that it makes her Whoozy headed.   Please Advise.

## 2021-02-06 NOTE — Telephone Encounter (Signed)
Left message on voicemail for patient to return call when available    Medication list updated to reflect change.   2 week follow-up appt scheduled (need to confirm with patient and change if doesn't agree with her schedule)

## 2021-02-06 NOTE — Telephone Encounter (Signed)
LMOM to return call.

## 2021-02-12 NOTE — Telephone Encounter (Signed)
LMOM to return call.

## 2021-02-12 NOTE — Telephone Encounter (Signed)
Patient notified and agreed. Confirmed appointment for 11/1

## 2021-02-20 ENCOUNTER — Encounter: Payer: Self-pay | Admitting: Family

## 2021-02-20 ENCOUNTER — Other Ambulatory Visit: Payer: Self-pay

## 2021-02-20 ENCOUNTER — Ambulatory Visit (INDEPENDENT_AMBULATORY_CARE_PROVIDER_SITE_OTHER): Payer: Medicare Other | Admitting: Family

## 2021-02-20 ENCOUNTER — Other Ambulatory Visit: Payer: Self-pay | Admitting: Family

## 2021-02-20 VITALS — BP 178/80 | HR 68 | Temp 97.1°F | Resp 16 | Ht 60.0 in | Wt 188.4 lb

## 2021-02-20 DIAGNOSIS — I10 Essential (primary) hypertension: Secondary | ICD-10-CM | POA: Diagnosis not present

## 2021-02-20 DIAGNOSIS — M545 Low back pain, unspecified: Secondary | ICD-10-CM

## 2021-02-20 DIAGNOSIS — G8929 Other chronic pain: Secondary | ICD-10-CM | POA: Diagnosis not present

## 2021-02-20 MED ORDER — ACETAMINOPHEN 500 MG PO TABS: 1000.0000 mg | ORAL_TABLET | Freq: Two times a day (BID) | ORAL | 1 refills | Status: DC | PRN

## 2021-02-20 MED ORDER — IRBESARTAN 150 MG PO TABS: 150.0000 mg | ORAL_TABLET | Freq: Two times a day (BID) | ORAL | 1 refills | Status: DC

## 2021-02-20 NOTE — Patient Instructions (Signed)
- Advised to check Blood pressure at home and record on log provided and notify provider if B/p > 140/90   DASH Eating Plan DASH stands for Dietary Approaches to Stop Hypertension. The DASH eating plan is a healthy eating plan that has been shown to: Reduce high blood pressure (hypertension). Reduce your risk for type 2 diabetes, heart disease, and stroke. Help with weight loss. What are tips for following this plan? Reading food labels Check food labels for the amount of salt (sodium) per serving. Choose foods with less than 5 percent of the Daily Value of sodium. Generally, foods with less than 300 milligrams (mg) of sodium per serving fit into this eating plan. To find whole grains, look for the word "whole" as the first word in the ingredient list. Shopping Buy products labeled as "low-sodium" or "no salt added." Buy fresh foods. Avoid canned foods and pre-made or frozen meals. Cooking Avoid adding salt when cooking. Use salt-free seasonings or herbs instead of table salt or sea salt. Check with your health care provider or pharmacist before using salt substitutes. Do not fry foods. Cook foods using healthy methods such as baking, boiling, grilling, roasting, and broiling instead. Cook with heart-healthy oils, such as olive, canola, avocado, soybean, or sunflower oil. Meal planning  Eat a balanced diet that includes: 4 or more servings of fruits and 4 or more servings of vegetables each day. Try to fill one-half of your plate with fruits and vegetables. 6-8 servings of whole grains each day. Less than 6 oz (170 g) of lean meat, poultry, or fish each day. A 3-oz (85-g) serving of meat is about the same size as a deck of cards. One egg equals 1 oz (28 g). 2-3 servings of low-fat dairy each day. One serving is 1 cup (237 mL). 1 serving of nuts, seeds, or beans 5 times each week. 2-3 servings of heart-healthy fats. Healthy fats called omega-3 fatty acids are found in foods such as walnuts,  flaxseeds, fortified milks, and eggs. These fats are also found in cold-water fish, such as sardines, salmon, and mackerel. Limit how much you eat of: Canned or prepackaged foods. Food that is high in trans fat, such as some fried foods. Food that is high in saturated fat, such as fatty meat. Desserts and other sweets, sugary drinks, and other foods with added sugar. Full-fat dairy products. Do not salt foods before eating. Do not eat more than 4 egg yolks a week. Try to eat at least 2 vegetarian meals a week. Eat more home-cooked food and less restaurant, buffet, and fast food. Lifestyle When eating at a restaurant, ask that your food be prepared with less salt or no salt, if possible. If you drink alcohol: Limit how much you use to: 0-1 drink a day for women who are not pregnant. 0-2 drinks a day for men. Be aware of how much alcohol is in your drink. In the [place] one drink equals one 12 oz bottle of beer (355 mL), one 5 oz glass of wine (148 mL), or one 1 oz glass of hard liquor (44 mL). General information Avoid eating more than 2,300 mg of salt a day. If you have hypertension, you may need to reduce your sodium intake to 1,500 mg a day. Work with your health care provider to maintain a healthy body weight or to lose weight. Ask what an ideal weight is for you. Get at least 30 minutes of exercise that causes your heart to beat faster (aerobic   exercise) most days of the week. Activities may include walking, swimming, or biking. Work with your health care provider or dietitian to adjust your eating plan to your individual calorie needs. What foods should I eat? Fruits All fresh, dried, or frozen fruit. Canned fruit in natural juice (without added sugar). Vegetables Fresh or frozen vegetables (raw, steamed, roasted, or grilled). Low-sodium or reduced-sodium tomato and vegetable juice. Low-sodium or reduced-sodium tomato sauce and tomato paste. Low-sodium or reduced-sodium canned  vegetables. Grains Whole-grain or whole-wheat bread. Whole-grain or whole-wheat pasta. Brown rice. Oatmeal. Quinoa. Bulgur. Whole-grain and low-sodium cereals. Pita bread. Low-fat, low-sodium crackers. Whole-wheat flour tortillas. Meats and other proteins Skinless chicken or turkey. Ground chicken or turkey. Pork with fat trimmed off. Fish and seafood. Egg whites. Dried beans, peas, or lentils. Unsalted nuts, nut butters, and seeds. Unsalted canned beans. Lean cuts of beef with fat trimmed off. Low-sodium, lean precooked or cured meat, such as sausages or meat loaves. Dairy Low-fat (1%) or fat-free (skim) milk. Reduced-fat, low-fat, or fat-free cheeses. Nonfat, low-sodium ricotta or cottage cheese. Low-fat or nonfat yogurt. Low-fat, low-sodium cheese. Fats and oils Soft margarine without trans fats. Vegetable oil. Reduced-fat, low-fat, or light mayonnaise and salad dressings (reduced-sodium). Canola, safflower, olive, avocado, soybean, and sunflower oils. Avocado. Seasonings and condiments Herbs. Spices. Seasoning mixes without salt. Other foods Unsalted popcorn and pretzels. Fat-free sweets. The items listed above may not be a complete list of foods and beverages you can eat. Contact a dietitian for more information. What foods should I avoid? Fruits Canned fruit in a light or heavy syrup. Fried fruit. Fruit in cream or butter sauce. Vegetables Creamed or fried vegetables. Vegetables in a cheese sauce. Regular canned vegetables (not low-sodium or reduced-sodium). Regular canned tomato sauce and paste (not low-sodium or reduced-sodium). Regular tomato and vegetable juice (not low-sodium or reduced-sodium). Pickles. Olives. Grains Baked goods made with fat, such as croissants, muffins, or some breads. Dry pasta or rice meal packs. Meats and other proteins Fatty cuts of meat. Ribs. Fried meat. Bacon. Bologna, salami, and other precooked or cured meats, such as sausages or meat loaves. Fat from  the back of a pig (fatback). Bratwurst. Salted nuts and seeds. Canned beans with added salt. Canned or smoked fish. Whole eggs or egg yolks. Chicken or turkey with skin. Dairy Whole or 2% milk, cream, and half-and-half. Whole or full-fat cream cheese. Whole-fat or sweetened yogurt. Full-fat cheese. Nondairy creamers. Whipped toppings. Processed cheese and cheese spreads. Fats and oils Butter. Stick margarine. Lard. Shortening. Ghee. Bacon fat. Tropical oils, such as coconut, palm kernel, or palm oil. Seasonings and condiments Onion salt, garlic salt, seasoned salt, table salt, and sea salt. Worcestershire sauce. Tartar sauce. Barbecue sauce. Teriyaki sauce. Soy sauce, including reduced-sodium. Steak sauce. Canned and packaged gravies. Fish sauce. Oyster sauce. Cocktail sauce. Store-bought horseradish. Ketchup. Mustard. Meat flavorings and tenderizers. Bouillon cubes. Hot sauces. Pre-made or packaged marinades. Pre-made or packaged taco seasonings. Relishes. Regular salad dressings. Other foods Salted popcorn and pretzels. The items listed above may not be a complete list of foods and beverages you should avoid. Contact a dietitian for more information. Where to find more information National Heart, Lung, and Blood Institute: www.nhlbi.nih.gov American Heart Association: www.heart.org Academy of Nutrition and Dietetics: www.eatright.org National Kidney Foundation: www.kidney.org Summary The DASH eating plan is a healthy eating plan that has been shown to reduce high blood pressure (hypertension). It may also reduce your risk for type 2 diabetes, heart disease, and stroke. When on the DASH   eating plan, aim to eat more fresh fruits and vegetables, whole grains, lean proteins, low-fat dairy, and heart-healthy fats. With the DASH eating plan, you should limit salt (sodium) intake to 2,300 mg a day. If you have hypertension, you may need to reduce your sodium intake to 1,500 mg a day. Work with your  health care provider or dietitian to adjust your eating plan to your individual calorie needs. This information is not intended to replace advice given to you by your health care provider. Make sure you discuss any questions you have with your health care provider. Document Revised: 03/12/2019 Document Reviewed: 03/12/2019 Elsevier Patient Education  2022 [employer]   

## 2021-02-20 NOTE — Progress Notes (Signed)
Provider: Richarda Blade FNP-C  Wandra Babin, Donalee Citrin, NP  Patient Care Team: Nolan Lasser, Donalee Citrin, NP as PCP - General (Family Medicine) Elise Benne, MD as Consulting Physician (Ophthalmology)  Extended Emergency Contact Information Primary Emergency Contact: [name],[name] Address: [address]          [place], [place] [address] [name] [place] of [place] Home Phone: [phone] Mobile Phone: [phone] Relation: Daughter  Code Status:  DNR Goals of care: Advanced Directive information Advanced Directives 12/29/2020  Does Patient Have a Medical Advance Directive? No  Does patient want to make changes to medical advance directive? -  Would patient like information on creating a medical advance directive? No - Patient declined     Chief Complaint  Patient presents with   Follow-up    2 week follow up on BP.    HPI:  Pt is a 80 y.o. female seen today for an acute visit for follow up blood pressure.states took irbesartan 300 mg tablet but would wake with headache at the back of her head.so she stopped taking Irbesartan.Has been off medication for a while now.not checking her blood pressure at home.Checked B/p at [employer] and was elevated in the 170's. States did not have headache while taking irbesartan 150 mg tablet twice daily.states feels fine though aware high blood pressure present with slight symptoms.she continues to exercise at the Gym 3-4 times per week paddling the Bike and lifting weight. She denies any dizziness,vision changes,fatigue,chest tightness,palpitation,chest pain or shortness of breath.     Had injection on her knees that helped with her pain on her lower back.MRI was scheduled but could not do since she did not have any pain when she was asked on a scale of 0-10 she said pain was a zero.Has a follow up with Orthopedic Dr.Jones to see if she can get an MRI.Has pain on the tailbone which is worst with sitting.    Past Medical History:  Diagnosis Date    GERD (gastroesophageal reflux disease)    HTN (hypertension)    Hyperlipidemia    Insomnia, unspecified    Obesity, unspecified    Osteoarthritis    Other abnormality of red blood cells    Microcytosis   Peripheral vascular disease, unspecified (HCC)    Vertigo, benign positional    Head to left side   Past Surgical History:  Procedure Laterality Date   ABLATION ON ENDOMETRIOSIS  1980   CHOLECYSTECTOMY, LAPAROSCOPIC  1989   COLONOSCOPY  2008   COLONOSCOPY  2013   TOTAL ABDOMINAL HYSTERECTOMY  1971    Allergies  Allergen Reactions   Naproxen    Nitrofuran Derivatives Other (See Comments)    Knots on body   Propoxyphene     Darvocet   Azithromycin Other (See Comments)    headache, excessive sleepiness    Outpatient Encounter Medications as of 02/20/2021  Medication Sig   aspirin 81 MG tablet Take 81 mg by mouth daily.   Bioflavonoid Products (ESTER C PO) Take 1 tablet (1000 mg ) by mouth once a day   CALCIUM CITRATE PO Take 1 tablet (1000 mg ) by mouth daily   Cholecalciferol 4000 units TABS Take 1 tablet by mouth daily   Cod Liver Oil CAPS Take 1 tablet by mouth daily   fluticasone (FLONASE) 50 MCG/ACT nasal spray SPRAY 2 SPRAYS INTO EACH NOSTRIL EVERY DAY   ibandronate (BONIVA) 150 MG tablet TAKE 1 EVERY 30 DAYS IN THE AM WITH FULL GLASS OF WATER ON EMPTY  STOMACH. DO NOT LIE DOWN X30 MINUTE   ibuprofen (ADVIL) 800 MG tablet Take 1 tablet (800 mg total) by mouth 2 (two) times daily as needed.   irbesartan (AVAPRO) 150 MG tablet Take 150 mg by mouth daily.   Naphazoline HCl (CLEAR EYES OP) Apply to eye as needed.    pantoprazole (PROTONIX) 40 MG tablet TAKE 1 TABLET BY MOUTH DAILY.   pravastatin (PRAVACHOL) 40 MG tablet Take 1 tablet (40 mg total) by mouth daily.   No facility-administered encounter medications on file as of 02/20/2021.    Review of Systems  Constitutional:  Negative for appetite change, chills, fatigue, fever and unexpected weight change.  HENT:   Negative for congestion, dental problem, ear discharge, ear pain, facial swelling, hearing loss, nosebleeds, postnasal drip, rhinorrhea, sinus pressure, sinus pain, sneezing and sore throat.   Eyes:  Negative for pain, discharge, redness, itching and visual disturbance.  Respiratory:  Negative for cough, chest tightness, shortness of breath and wheezing.   Cardiovascular:  Negative for chest pain, palpitations and leg swelling.  Gastrointestinal:  Negative for abdominal distention, abdominal pain, constipation, nausea and vomiting.  Musculoskeletal:  Positive for arthralgias. Negative for gait problem, joint swelling, myalgias, neck pain and neck stiffness.       Sacral pain follows up with Orthopedic   Skin:  Negative for color change, pallor, rash and wound.  Neurological:  Negative for dizziness, syncope, speech difficulty, weakness, light-headedness and numbness.       Headache while on irbesartan 300 mg tablet so she stopped medication with improvement   Hematological:  Does not bruise/bleed easily.  Psychiatric/Behavioral:  Negative for agitation, behavioral problems, confusion, hallucinations and sleep disturbance. The patient is not nervous/anxious.    Immunization History  Administered Date(s) Administered   Influenza, High Dose Seasonal PF 02/14/2021   Influenza,inj,Quad PF,6+ Mos 02/28/2016   PFIZER(Purple Top)SARS-COV-2 Vaccination 06/18/2019, 07/13/2019, 02/09/2020   Pneumococcal Conjugate-13 05/24/2016   Pertinent  Health Maintenance Due  Topic Date Due   DEXA SCAN  Completed   INFLUENZA VACCINE  Discontinued   Fall Risk 06/14/2019 06/22/2020 06/30/2020 08/28/2020 12/29/2020  Falls in the past year? 0 0 0 0 0  Was there an injury with Fall? 0 0 0 0 0  Fall Risk Category Calculator 0 0 0 0 0  Fall Risk Category Low Low Low Low Low  Patient Fall Risk Level Low fall risk Low fall risk Low fall risk Low fall risk Low fall risk  Patient at Risk for Falls Due to - - - - No Fall Risks   Fall risk Follow up - - - - Falls evaluation completed   Functional Status Survey:    Vitals:   02/20/21 1135  BP: (!) 178/80  Pulse: 68  Resp: 16  Temp: (!) 97.1 F (36.2 C)  SpO2: 97%  Weight: 188 lb 6.4 oz (85.5 kg)  Height: 5' (1.524 m)   Body mass index is 36.79 kg/m. Physical Exam Vitals reviewed.  Constitutional:      General: She is not in acute distress.    Appearance: Normal appearance. She is obese. She is not ill-appearing or diaphoretic.  HENT:     Head: Normocephalic.  Eyes:     General: No scleral icterus.       Right eye: No discharge.        Left eye: No discharge.     Conjunctiva/sclera: Conjunctivae normal.     Pupils: Pupils are equal, round, and reactive to light.  Neck:  Vascular: No carotid bruit.  Cardiovascular:     Rate and Rhythm: Normal rate and regular rhythm.     Pulses: Normal pulses.     Heart sounds: Normal heart sounds. No murmur heard.   No friction rub. No gallop.  Pulmonary:     Effort: Pulmonary effort is normal. No respiratory distress.     Breath sounds: Normal breath sounds. No wheezing, rhonchi or rales.  Chest:     Chest wall: No tenderness.  Abdominal:     General: Bowel sounds are normal. There is no distension.     Palpations: Abdomen is soft. There is no mass.     Tenderness: There is no abdominal tenderness. There is no right CVA tenderness, left CVA tenderness, guarding or rebound.  Musculoskeletal:        General: No swelling or tenderness. Normal range of motion.     Cervical back: Normal range of motion. No rigidity.     Right lower leg: No edema.     Left lower leg: No edema.  Skin:    General: Skin is warm and dry.     Coloration: Skin is not pale.     Findings: No erythema.  Neurological:     Mental Status: She is alert and oriented to person, place, and time.     Motor: No weakness.     Gait: Gait normal.  Psychiatric:        Mood and Affect: Mood normal.        Speech: Speech normal.         Behavior: Behavior normal.        Thought Content: Thought content normal.        Judgment: Judgment normal.    Labs reviewed: Recent Labs    01/01/21 1414  NA 138  K 3.6  CL 104  CO2 24  GLUCOSE 89  BUN 12  CREATININE 0.62  CALCIUM 9.6   Recent Labs    01/01/21 1414  AST 18  ALT 15  BILITOT 0.4  PROT 7.3   Recent Labs    01/01/21 1414  WBC 10.5  NEUTROABS 7,424  HGB 11.8  HCT 38.4  MCV 75.4*  PLT 376   Lab Results  Component Value Date   TSH 1.47 01/01/2021   Lab Results  Component Value Date   HGBA1C 5.9 (H) 01/08/2019   Lab Results  Component Value Date   CHOL 183 01/01/2021   HDL 53 01/01/2021   LDLCALC 106 (H) 01/01/2021   TRIG 125 01/01/2021   CHOLHDL 3.5 01/01/2021    Significant Diagnostic Results in last 30 days:  No results found.  Assessment/Plan 1. Essential hypertension B/p elevated today.stopped taking Irbesartan 300 mg tablet due to headaches.Had no headache taking Irbesartan 150 mg tablet twice daily.will restart 150 mg twice daily and discontinue the 300 mg tablet daily.  - Advised to check Blood pressure at home and record on log provided and notify provider if B/p > 140/90  - irbesartan (AVAPRO) 150 MG tablet; Take 1 tablet (150 mg total) by mouth 2 (two) times daily.  Dispense: 90 tablet; Refill: 1  2. Chronic bilateral low back pain without sciatica Chronic  - continue to follow up with Orthopedic  - acetaminophen (TYLENOL) 500 MG tablet; Take 2 tablets (1,000 mg total) by mouth 2 (two) times daily as needed.  Dispense: 60 tablet; Refill: 1 - discontinue Ibuprofen   Family/ staff Communication: Reviewed plan of care with patient verbalized understanding  Labs/tests ordered: None   Next Appointment: Has appointment   Caesar Bookman, NP

## 2021-03-16 ENCOUNTER — Other Ambulatory Visit: Payer: Self-pay | Admitting: Family

## 2021-03-16 DIAGNOSIS — M810 Age-related osteoporosis without current pathological fracture: Secondary | ICD-10-CM

## 2021-03-29 ENCOUNTER — Other Ambulatory Visit: Payer: Self-pay | Admitting: *Deleted

## 2021-03-29 DIAGNOSIS — I10 Essential (primary) hypertension: Secondary | ICD-10-CM

## 2021-03-29 MED ORDER — IRBESARTAN 150 MG PO TABS: 150.0000 mg | ORAL_TABLET | Freq: Two times a day (BID) | ORAL | 1 refills | Status: DC

## 2021-03-29 NOTE — Telephone Encounter (Signed)
Patient called and stated that she has not received her Rx for the Irbesartan. Stated that the pharmacy never received Rx. And Crystal Preston prescribed at [facility] on 02/20/2021.  Pended Rx and sent to [facility] for approval due to HIGH ALERT Warning.

## 2021-03-30 ENCOUNTER — Telehealth: Payer: Self-pay | Admitting: *Deleted

## 2021-03-30 NOTE — Telephone Encounter (Signed)
Patient called and stated that we called in the wrong Irbesartan, stated that the pharmacy gave her 300mg  and she is suppose to have 150mg .   I explained to patient that we sent the Irbesartan 150mg    I called [facility] [address] [phone] and spoke with and she stated that they did give patient Irbesartan 300mg  because it was on automatic refill and the other Rx was put to the side.  Stated that patient can bring that Rx back and they will correct it.   I called and spoke with patient and explained to her what happened, she stated that she will take the medication back tomorrow and exchange it for the correct one.

## 2021-04-03 ENCOUNTER — Other Ambulatory Visit: Payer: Self-pay

## 2021-04-03 ENCOUNTER — Ambulatory Visit (INDEPENDENT_AMBULATORY_CARE_PROVIDER_SITE_OTHER): Payer: Medicare Other | Admitting: Family

## 2021-04-03 ENCOUNTER — Encounter: Payer: Self-pay | Admitting: Family

## 2021-04-03 DIAGNOSIS — B370 Candidal stomatitis: Secondary | ICD-10-CM

## 2021-04-03 MED ORDER — NYSTATIN 100000 UNIT/ML MT SUSP: 5.0000 mL | Freq: Four times a day (QID) | OROMUCOSAL | 0 refills | Status: DC

## 2021-04-03 NOTE — Progress Notes (Signed)
This service is provided via telemedicine  No vital signs collected/recorded due to the encounter was a telemedicine visit.   Location of patient (ex: home, work):  Home.  Patient consents to a telephone visit:  Yes  Location of the provider (ex: office, home):  [employer].   Name of any referring provider:  Maritssa Haughton, Donalee Citrin, NP   Names of all persons participating in the telemedicine service and their role in the encounter:  Patient, [name], RMA, Rosa Wyly, Carilyn Goodpasture, NP.    Time spent on call: 8 minutes spent on the phone with Medical Assistant.     Location:      Place of Service:    Provider: Rilley Poulter FNP-C  Charliee Krenz, Donalee Citrin, NP  Patient Care Team: Anwar Crill, Donalee Citrin, NP as PCP - General (Family Medicine) Elise Benne, MD as Consulting Physician (Ophthalmology)  Extended Emergency Contact Information Primary Emergency Contact: [name],[name] Address: [address]          [place], [place] [address] [name] of [place] Home Phone: [phone] Mobile Phone: [phone] Relation: Daughter  Code Status:  Full Code  Goals of care: Advanced Directive information Advanced Directives 04/03/2021  Does Patient Have a Medical Advance Directive? No  Does patient want to make changes to medical advance directive? -  Would patient like information on creating a medical advance directive? No - Patient declined     Chief Complaint  Patient presents with   Acute Visit    Patient complains of possible thrush in mouth. Patient states inside mouth is rough, nasty taste. Patient states that symptoms started last week on Wednesday 03/28/2021    HPI:  Pt is a 80 y.o. female seen today for an acute visit for evaluation of oral thrush x 3 days.has had whitish color in the mouth described as rough with nasty taste.food do not taste good but tries to eat.  Had cortisol injection last Wednesday 03/28/2021.Had similar symptoms several years after get  injection to the knees.  She denies any fever,chills or URI symptoms.    Past Medical History:  Diagnosis Date   GERD (gastroesophageal reflux disease)    HTN (hypertension)    Hyperlipidemia    Insomnia, unspecified    Obesity, unspecified    Osteoarthritis    Other abnormality of red blood cells    Microcytosis   Peripheral vascular disease, unspecified (HCC)    Vertigo, benign positional    Head to left side   Past Surgical History:  Procedure Laterality Date   ABLATION ON ENDOMETRIOSIS  1980   CHOLECYSTECTOMY, LAPAROSCOPIC  1989   COLONOSCOPY  2008   COLONOSCOPY  2013   TOTAL ABDOMINAL HYSTERECTOMY  1971    Allergies  Allergen Reactions   Naproxen    Nitrofuran Derivatives Other (See Comments)    Knots on body   Propoxyphene     Darvocet   Azithromycin Other (See Comments)    headache, excessive sleepiness    Outpatient Encounter Medications as of 04/03/2021  Medication Sig   acetaminophen (TYLENOL) 500 MG tablet Take 2 tablets (1,000 mg total) by mouth 2 (two) times daily as needed.   aspirin 81 MG tablet Take 81 mg by mouth daily.   Bioflavonoid Products (ESTER C PO) Take 1 tablet (1000 mg ) by mouth once a day   CALCIUM CITRATE PO Take 1 tablet (1000 mg ) by mouth daily   Cholecalciferol 4000 units TABS Take 1 tablet by mouth daily  Cod Liver Oil CAPS Take 1 tablet by mouth daily   fluticasone (FLONASE) 50 MCG/ACT nasal spray SPRAY 2 SPRAYS INTO EACH NOSTRIL EVERY DAY   ibandronate (BONIVA) 150 MG tablet TAKE 1 EVERY 30 DAYS IN THE AM WITH FULL GLASS OF WATER ON EMPTY STOMACH. DO NOT LIE DOWN X30 MINUTE   irbesartan (AVAPRO) 150 MG tablet Take 1 tablet (150 mg total) by mouth 2 (two) times daily.   Naphazoline HCl (CLEAR EYES OP) Apply to eye as needed.    pantoprazole (PROTONIX) 40 MG tablet TAKE 1 TABLET BY MOUTH DAILY.   pravastatin (PRAVACHOL) 40 MG tablet Take 1 tablet (40 mg total) by mouth daily.   No facility-administered encounter medications on  file as of 04/03/2021.    Review of Systems  Constitutional:  Negative for appetite change, chills, fatigue and fever.  HENT:  Negative for congestion, postnasal drip, rhinorrhea, sinus pressure, sinus pain, sneezing and sore throat.        Whitish color in the mouth  Respiratory:  Negative for cough, chest tightness, shortness of breath and wheezing.   Cardiovascular:  Negative for chest pain, palpitations and leg swelling.  Gastrointestinal:  Negative for abdominal distention, abdominal pain, constipation, diarrhea, nausea and vomiting.  Skin:  Negative for color change, pallor, rash and wound.  Neurological:  Negative for dizziness, light-headedness and headaches.   Immunization History  Administered Date(s) Administered   Influenza, High Dose Seasonal PF 02/14/2021   Influenza,inj,Quad PF,6+ Mos 02/28/2016   PFIZER(Purple Top)SARS-COV-2 Vaccination 06/18/2019, 07/13/2019, 02/09/2020   Pneumococcal Conjugate-13 05/24/2016   Pertinent  Health Maintenance Due  Topic Date Due   DEXA SCAN  Completed   INFLUENZA VACCINE  Discontinued   Fall Risk 06/22/2020 06/30/2020 08/28/2020 12/29/2020 04/03/2021  Falls in the past year? 0 0 0 0 0  Was there an injury with Fall? 0 0 0 0 0  Fall Risk Category Calculator 0 0 0 0 0  Fall Risk Category Low Low Low Low Low  Patient Fall Risk Level Low fall risk Low fall risk Low fall risk Low fall risk Low fall risk  Patient at Risk for Falls Due to - - - No Fall Risks No Fall Risks  Fall risk Follow up - - - Falls evaluation completed Falls evaluation completed   Functional Status Survey:    There were no vitals filed for this visit. There is no height or weight on file to calculate BMI. Physical Exam  Unable to complete on telephone visit.   Labs reviewed: Recent Labs    01/01/21 1414  NA 138  K 3.6  CL 104  CO2 24  GLUCOSE 89  BUN 12  CREATININE 0.62  CALCIUM 9.6   Recent Labs    01/01/21 1414  AST 18  ALT 15  BILITOT 0.4  PROT  7.3   Recent Labs    01/01/21 1414  WBC 10.5  NEUTROABS 7,424  HGB 11.8  HCT 38.4  MCV 75.4*  PLT 376   Lab Results  Component Value Date   TSH 1.47 01/01/2021   Lab Results  Component Value Date   HGBA1C 5.9 (H) 01/08/2019   Lab Results  Component Value Date   CHOL 183 01/01/2021   HDL 53 01/01/2021   LDLCALC 106 (H) 01/01/2021   TRIG 125 01/01/2021   CHOLHDL 3.5 01/01/2021    Significant Diagnostic Results in last 30 days:  No results found.  Assessment/Plan   Oropharyngeal candidiasis Reports whitish mouth coating post cortisol  injection.Has had similar symptoms in the past treated with ? Mouth wash. Suspect oral candidiasis. Will start on Nystatin as below Advised to avoid eating or drinking anything for 30 minutes after using Nystatin suspension swish.   - nystatin (MYCOSTATIN) 100000 UNIT/ML suspension; Take 5 mLs (500,000 Units total) by mouth 4 (four) times daily.  Dispense: 60 mL; Refill: 0  - Notify provider if symptoms worsen or fail to improve.    Family/ staff Communication: Reviewed plan of care with patient verbalized understanding.   Labs/tests ordered: None   Next Appointment: As needed if symptoms worsen or fail to improve   I connected with  Crystal Preston on 04/03/21 by a Telephone enabled telemedicine application and verified that I am speaking with the correct person using two identifiers.   I discussed the limitations of evaluation and management by telemedicine. The patient expressed understanding and agreed to proceed.  Spent 11 minutes of non-face to face with patient  >50% time spent counseling; reviewing medical record; tests; labs; and developing future plan of care.   Caesar Bookman, NP

## 2021-04-03 NOTE — Patient Instructions (Signed)

## 2021-05-30 ENCOUNTER — Other Ambulatory Visit: Payer: Self-pay | Admitting: Student

## 2021-05-30 DIAGNOSIS — M4316 Spondylolisthesis, lumbar region: Secondary | ICD-10-CM

## 2021-06-23 ENCOUNTER — Other Ambulatory Visit: Payer: Medicare Other

## 2021-06-24 ENCOUNTER — Other Ambulatory Visit: Payer: Self-pay | Admitting: Family

## 2021-06-24 DIAGNOSIS — E782 Mixed hyperlipidemia: Secondary | ICD-10-CM

## 2021-06-25 ENCOUNTER — Other Ambulatory Visit: Payer: Self-pay | Admitting: *Deleted

## 2021-06-25 DIAGNOSIS — I10 Essential (primary) hypertension: Secondary | ICD-10-CM

## 2021-06-25 MED ORDER — IRBESARTAN 150 MG PO TABS: 150.0000 mg | ORAL_TABLET | Freq: Two times a day (BID) | ORAL | 1 refills | Status: DC

## 2021-06-25 NOTE — Telephone Encounter (Signed)
Pharmacy requested refill.  Pended Rx and sent to [facility] for approval due to HIGH ALERT Warning.  

## 2021-06-27 ENCOUNTER — Other Ambulatory Visit: Payer: Medicare Other

## 2021-06-27 ENCOUNTER — Ambulatory Visit
Admission: RE | Admit: 2021-06-27 | Discharge: 2021-06-27 | Disposition: A | Discharge status: Home / Self Care | Payer: Medicare Other | Source: Ambulatory Visit | Attending: Student | Admitting: Student

## 2021-06-27 ENCOUNTER — Other Ambulatory Visit: Payer: Self-pay

## 2021-06-27 DIAGNOSIS — M4316 Spondylolisthesis, lumbar region: Secondary | ICD-10-CM

## 2021-06-27 DIAGNOSIS — E2839 Other primary ovarian failure: Secondary | ICD-10-CM

## 2021-06-27 DIAGNOSIS — Z79899 Other long term (current) drug therapy: Secondary | ICD-10-CM

## 2021-06-27 DIAGNOSIS — E782 Mixed hyperlipidemia: Secondary | ICD-10-CM

## 2021-06-27 DIAGNOSIS — I10 Essential (primary) hypertension: Secondary | ICD-10-CM

## 2021-06-28 ENCOUNTER — Other Ambulatory Visit: Payer: Self-pay | Admitting: Family

## 2021-06-28 DIAGNOSIS — K219 Gastro-esophageal reflux disease without esophagitis: Secondary | ICD-10-CM

## 2021-06-29 ENCOUNTER — Encounter: Payer: Medicare Other | Admitting: Family

## 2021-06-29 ENCOUNTER — Other Ambulatory Visit: Payer: Medicare Other

## 2021-07-02 ENCOUNTER — Encounter: Payer: Self-pay | Admitting: Family

## 2021-07-02 ENCOUNTER — Other Ambulatory Visit: Payer: Self-pay

## 2021-07-02 ENCOUNTER — Ambulatory Visit (INDEPENDENT_AMBULATORY_CARE_PROVIDER_SITE_OTHER): Payer: Medicare Other | Admitting: Family

## 2021-07-02 ENCOUNTER — Other Ambulatory Visit: Payer: Medicare Other

## 2021-07-02 VITALS — BP 150/100 | HR 75 | Temp 97.6°F | Ht 60.0 in | Wt 181.8 lb

## 2021-07-02 DIAGNOSIS — M545 Low back pain, unspecified: Secondary | ICD-10-CM

## 2021-07-02 DIAGNOSIS — E782 Mixed hyperlipidemia: Secondary | ICD-10-CM

## 2021-07-02 DIAGNOSIS — Z79899 Other long term (current) drug therapy: Secondary | ICD-10-CM

## 2021-07-02 DIAGNOSIS — I10 Essential (primary) hypertension: Secondary | ICD-10-CM | POA: Diagnosis not present

## 2021-07-02 DIAGNOSIS — K219 Gastro-esophageal reflux disease without esophagitis: Secondary | ICD-10-CM

## 2021-07-02 DIAGNOSIS — M810 Age-related osteoporosis without current pathological fracture: Secondary | ICD-10-CM

## 2021-07-02 DIAGNOSIS — E2839 Other primary ovarian failure: Secondary | ICD-10-CM

## 2021-07-02 DIAGNOSIS — G8929 Other chronic pain: Secondary | ICD-10-CM

## 2021-07-02 MED ORDER — AMLODIPINE BESYLATE 5 MG PO TABS: 5.0000 mg | ORAL_TABLET | Freq: Every day | ORAL | 0 refills | Status: DC

## 2021-07-02 NOTE — Progress Notes (Signed)
Provider: Richarda Blade FNP-C   Vanshika Jastrzebski, Donalee Citrin, NP  Patient Care Team: Maher Shon, Donalee Citrin, NP as PCP - General (Family Medicine) Elise Benne, MD as Consulting Physician (Ophthalmology)  Extended Emergency Contact Information Primary Emergency Contact: Crystal Preston Address: [address]          [place], [place] [address] [name] of [place] Home Phone: [phone] Mobile Phone: [phone] Relation: Daughter  Code Status:  Full Code  Goals of care: Advanced Directive information Advanced Directives 07/02/2021  Does Patient Have a Medical Advance Directive? No  Does patient want to make changes to medical advance directive? -  Would patient like information on creating a medical advance directive? No - Patient declined     Chief Complaint  Patient presents with   Medical Management of Chronic Issues    6 month follow up.   Immunizations    Discuss the need for Tetanus vaccine, Pne vaccine, and Covid Booster.     HPI:  Pt is a 81 y.o. female seen today for 70-month follow-up for medical management of chronic diseases. She has a medical history of hypertension, hyperlipidemia, low back pain without sciatica, osteoarthritis, osteoporosis among others. She missed 2 of her appointment 1 for fasting blood work but she does not recall missing appointment. She will schedule to come back for fasting blood work.Denies to be having problems with transportation states has to wait for her sister schedule to bring to visits.  Hypertension -blood pressure elevated today.  No home blood pressure readings for review. States has been taking irbesartan in 150 mg tablet twice daily.denies any headache,dizziness,vision changes,fatigue,chest tightness,palpitation,chest pain or shortness of breath.      Hyperlipidemia -on pravastatin 40 mg daily.Continues to exercise 3 times per week in the gym.States walks on the treadmill and does some light weight lifting.  Exercise limited by  left knee pain but tries to take it easy.  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 Also has advanced neural foraminal minimal stenosis on the right at L4-5 and left at L5 to S1 management.Has follow for up with Dr. Yetta [facility]re at [facility] in one week for results.   Osteoarthritis - left knee swells.Takes Tylenol and Ibuprofen.Follows up with orthopedic  Osteoporosis - on Boniva 150 mg every 30 days.No recent bone density for evaluation  Due for Tetanus ,PNA and COVID-19.she declines Tetanus and PNA vaccine. Place   Past Medical History:  Diagnosis Date   GERD (gastroesophageal reflux disease)    HTN (hypertension)    Hyperlipidemia    Insomnia, unspecified    Obesity, unspecified    Osteoarthritis    Other abnormality of red blood cells    Microcytosis   Peripheral vascular disease, unspecified (HCC)    Vertigo, benign positional    Head to left side   Past Surgical History:  Procedure Laterality Date   ABLATION ON ENDOMETRIOSIS  1980   CHOLECYSTECTOMY, LAPAROSCOPIC  1989   COLONOSCOPY  2008   COLONOSCOPY  2013   TOTAL ABDOMINAL HYSTERECTOMY  1971    Allergies  Allergen Reactions   Naproxen    Nitrofuran Derivatives Other (See Comments)    Knots on body   Propoxyphene     Darvocet   Azithromycin Other (See Comments)    headache, excessive sleepiness    Allergies as of  07/02/2021       Reactions   Naproxen    Nitrofuran Derivatives Other (See Comments)   Knots on body   Propoxyphene    Darvocet   Azithromycin Other (See Comments)   headache, excessive sleepiness        Medication List        Accurate as of July 02, 2021  9:11 PM. If you have any questions, ask your nurse or doctor.          STOP taking these medications    ESTER C PO Stopped by:  Donalee Citrin Amarionna Arca, NP   nystatin 100000 UNIT/ML suspension Commonly known as: MYCOSTATIN Stopped by: Caesar Bookman, NP       TAKE these medications    acetaminophen 500 MG tablet Commonly known as: TYLENOL Take 2 tablets (1,000 mg total) by mouth 2 (two) times daily as needed.   amLODipine 5 MG tablet Commonly known as: NORVASC Take 1 tablet (5 mg total) by mouth daily. Started by: Caesar Bookman, NP   aspirin 81 MG tablet Take 81 mg by mouth daily.   CALCIUM CITRATE PO Take 1 tablet (1000 mg ) by mouth daily   Cholecalciferol 100 MCG (4000 UT) Tabs Take 1 tablet by mouth daily   CLEAR EYES OP Apply to eye as needed.   Cod Liver Oil Caps Take 1 tablet by mouth daily   fluticasone 50 MCG/ACT nasal spray Commonly known as: FLONASE SPRAY 2 SPRAYS INTO EACH NOSTRIL EVERY DAY   ibandronate 150 MG tablet Commonly known as: BONIVA TAKE 1 EVERY 30 DAYS IN THE AM WITH FULL GLASS OF WATER ON EMPTY STOMACH. DO NOT LIE DOWN X30 MINUTE   irbesartan 150 MG tablet Commonly known as: AVAPRO Take 1 tablet (150 mg total) by mouth 2 (two) times daily.   pantoprazole 40 MG tablet Commonly known as: PROTONIX TAKE 1 TABLET BY MOUTH EVERY DAY   pravastatin 40 MG tablet Commonly known as: PRAVACHOL TAKE 1 TABLET BY MOUTH EVERY DAY        Review of Systems  Constitutional:  Negative for appetite change, chills, fatigue, fever and unexpected weight change.  HENT:  Negative for congestion, dental problem, ear discharge, ear pain, facial swelling, hearing loss, nosebleeds, postnasal drip, rhinorrhea, sinus pressure, sinus pain, sneezing, sore throat, tinnitus and trouble swallowing.   Eyes:  Negative for pain, discharge, redness, itching and visual disturbance.  Respiratory:  Negative for cough, chest tightness, shortness of breath and wheezing.   Cardiovascular:  Negative for chest pain, palpitations and leg swelling.  Gastrointestinal:  Negative for abdominal distention,  abdominal pain, blood in stool, constipation, diarrhea, nausea and vomiting.  Endocrine: Negative for cold intolerance, heat intolerance, polydipsia, polyphagia and polyuria.  Genitourinary:  Negative for difficulty urinating, dysuria, flank pain, frequency and urgency.  Musculoskeletal:  Positive for arthralgias and back pain. Negative for gait problem, joint swelling, myalgias, neck pain and neck stiffness.  Skin:  Negative for color change, pallor, rash and wound.  Neurological:  Negative for dizziness, syncope, speech difficulty, weakness, light-headedness, numbness and headaches.  Hematological:  Does not bruise/bleed easily.  Psychiatric/Behavioral:  Negative for agitation, behavioral problems, confusion, hallucinations, self-injury, sleep disturbance and suicidal ideas. The patient is not nervous/anxious.    Immunization History  Administered Date(s) Administered   Influenza, High Dose Seasonal PF 02/14/2021   Influenza,inj,Quad PF,6+ Mos 02/28/2016   PFIZER(Purple Top)SARS-COV-2 Vaccination 06/18/2019, 07/13/2019, 02/09/2020   Pneumococcal Conjugate-13 05/24/2016   Pertinent  Health Maintenance Due  Topic Date Due   DEXA SCAN  Completed   INFLUENZA VACCINE  Discontinued   Fall Risk 08/28/2020 12/29/2020 04/03/2021 07/02/2021 07/02/2021  Falls in the past year? 0 0 0 0 0  Was there an injury with Fall? 0 0 0 0 0  Fall Risk Category Calculator 0 0 0 0 0  Fall Risk Category Low Low Low Low Low  Patient Fall Risk Level Low fall risk Low fall risk Low fall risk Low fall risk Low fall risk  Patient at Risk for Falls Due to - No Fall Risks No Fall Risks No Fall Risks No Fall Risks  Fall risk Follow up - Falls evaluation completed Falls evaluation completed Falls evaluation completed Falls evaluation completed   Functional Status Survey:    Vitals:   07/02/21 1258  BP: (!) 150/100  Pulse: 75  Temp: 97.6 F (36.4 C)  SpO2: 98%  Weight: 181 lb 12.8 oz (82.5 kg)  Height: 5' (1.524 m)    Body mass index is 35.51 kg/m. Physical Exam Vitals reviewed.  Constitutional:      General: She is not in acute distress.    Appearance: Normal appearance. She is normal weight. She is not ill-appearing or diaphoretic.  HENT:     Head: Normocephalic.     Right Ear: Tympanic membrane, ear canal and external ear normal. There is no impacted cerumen.     Left Ear: Tympanic membrane, ear canal and external ear normal. There is no impacted cerumen.     Nose: Nose normal. No congestion or rhinorrhea.     Mouth/Throat:     Mouth: Mucous membranes are moist.     Pharynx: Oropharynx is clear. No oropharyngeal exudate or posterior oropharyngeal erythema.  Eyes:     General: No scleral icterus.       Right eye: No discharge.        Left eye: No discharge.     Extraocular Movements: Extraocular movements intact.     Conjunctiva/sclera: Conjunctivae normal.     Pupils: Pupils are equal, round, and reactive to light.  Neck:     Vascular: No carotid bruit.  Cardiovascular:     Rate and Rhythm: Normal rate and regular rhythm.     Pulses: Normal pulses.     Heart sounds: Normal heart sounds. No murmur heard.   No friction rub. No gallop.  Pulmonary:     Effort: Pulmonary effort is normal. No respiratory distress.     Breath sounds: Normal breath sounds. No wheezing, rhonchi or rales.  Chest:     Chest wall: No tenderness.  Abdominal:     General: Bowel sounds are normal. There is no distension.     Palpations: Abdomen is soft. There is no mass.     Tenderness: There is no abdominal tenderness. There is no right CVA tenderness, left CVA tenderness, guarding or rebound.  Musculoskeletal:        General: No swelling or tenderness. Normal range of motion.     Cervical back: Normal range of motion. No rigidity or tenderness.     Right knee: Crepitus present. No swelling, effusion or erythema. Normal range of motion. No tenderness.     Left knee: Swelling and crepitus present. No effusion or  erythema. No tenderness.     Right lower leg: No edema.     Left lower leg: No edema.  Lymphadenopathy:     Cervical: No cervical adenopathy.  Skin:    General: Skin is warm and dry.  Coloration: Skin is not pale.     Findings: No bruising, erythema, lesion or rash.  Neurological:     Mental Status: She is alert and oriented to person, place, and time.     Cranial Nerves: No cranial nerve deficit.     Sensory: No sensory deficit.     Motor: No weakness.     Coordination: Coordination normal.     Gait: Gait normal.  Psychiatric:        Mood and Affect: Mood normal.        Speech: Speech normal.        Behavior: Behavior normal.        Thought Content: Thought content normal.        Judgment: Judgment normal.    Labs reviewed: Recent Labs    01/01/21 1414  NA 138  K 3.6  CL 104  CO2 24  GLUCOSE 89  BUN 12  CREATININE 0.62  CALCIUM 9.6   Recent Labs    01/01/21 1414  AST 18  ALT 15  BILITOT 0.4  PROT 7.3   Recent Labs    01/01/21 1414  WBC 10.5  NEUTROABS 7,424  HGB 11.8  HCT 38.4  MCV 75.4*  PLT 376   Lab Results  Component Value Date   TSH 1.47 01/01/2021   Lab Results  Component Value Date   HGBA1C 5.9 (H) 01/08/2019   Lab Results  Component Value Date   CHOL 183 01/01/2021   HDL 53 01/01/2021   LDLCALC 106 (H) 01/01/2021   TRIG 125 01/01/2021   CHOLHDL 3.5 01/01/2021    Significant Diagnostic Results in last 30 days:  MR LUMBAR SPINE WO CONTRAST  Result Date: 06/27/2021 CLINICAL DATA:  Lumbar spondylolisthesis. Chronic low back pain. Numbness in the legs. EXAM: MRI LUMBAR SPINE WITHOUT CONTRAST TECHNIQUE: Multiplanar, multisequence MR imaging of the lumbar spine was performed. No intravenous contrast was administered. COMPARISON:  Lumbar spine MRI 07/27/2019 FINDINGS: Segmentation:  Standard. Alignment: Chronic straightening of the normal lumbar lordosis, mild lumbar levoscoliosis, and minimal anterolisthesis of L2 on L3 and L4 on L5.  Vertebrae: No fracture, suspicious marrow lesion, or significant marrow edema. Conus medullaris and cauda equina: Conus extends to the L1 level. Conus and cauda equina appear normal. Paraspinal and other soft tissues: 1 cm T1 and T2 hyperintense focus in the upper pole of the right kidney, incompletely evaluated but may reflect a proteinaceous or hemorrhagic cyst. Disc levels: Disc desiccation and mild disc space narrowing from L2-3 to L5-S1. Diffuse congenital narrowing of the lumbar spinal canal due to short pedicles. T11-12: Only imaged sagittally. Mild disc bulging without evidence of significant stenosis. T12-L1: Negative. L1-2: Minimal disc bulging, stable to minimally increased. Mild facet and ligamentum flavum hypertrophy. No stenosis. L2-3: Disc bulging, a broad central disc extrusion with slight cranial migration, mildly prominent dorsal epidural fat, and severe facet and ligamentum flavum hypertrophy result in severe spinal stenosis and mild bilateral neural foraminal stenosis, progressed from prior. L3-4: Left eccentric disc bulging, mildly prominent dorsal epidural fat, and severe facet and ligamentum flavum hypertrophy result in moderate spinal stenosis and mild left neural foraminal stenosis, mildly progressed from prior. L4-5: Disc bulging, a right foraminal disc protrusion, and severe facet and ligamentum flavum hypertrophy result in severe spinal stenosis and severe right and mild left neural foraminal stenosis, mildly progressed from prior. L5-S1: Disc bulging, a left foraminal disc protrusion, and moderate facet hypertrophy result in moderate to severe left neural foraminal stenosis without spinal  stenosis, unchanged. IMPRESSION: 1. 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 2. Severe spinal stenosis at L2-3 and L4-5 and moderate spinal stenosis at L3-4. 3. Advanced neural foraminal stenosis on the right at L4-5 and left at L5-S1.  Electronically Signed   By: Sebastian AcheAllen  Grady M.D.   On: 06/27/2021 14:47    Assessment/Plan 1. Essential hypertension Blood pressure elevated.  Discussed adding amlodipine to current regimen but patient reluctant starting on another medication.  Discussed with her the importance of controlling blood pressure she accepts to start on amlodipine low-dose as below.  Side effects discussed. - we will follow-up in 2 weeks for reevaluation - amLODipine (NORVASC) 5 MG tablet; Take 1 tablet (5 mg total) by mouth daily.  Dispense: 90 tablet; Refill: 0  2. Mixed hyperlipidemia No recent LDL for review. We will schedule for fasting blood work. Continue on pravastatin Continue on dietary modification and exercise  3. Gastroesophageal reflux disease without esophagitis Symptoms stable Reports no tarry or black stool Continue on Protonix 40 mg tablet daily  4. Chronic bilateral low back pain without sciatica Chronic Continue to follow-up with neuro surgery and spine specialist Dr. Yetta BarreJones Has upcoming appointment to discuss MRI results  5. Age related osteoporosis, unspecified pathological fracture presence No recent bone density for review does not recall when she last had one. We will order bone density made aware that imaging place will call her to schedule [facility] - DG Bone Density; Future  Family/ staff Communication: Reviewed plan of care with patient verbalized understanding  Labs/tests ordered: Has lab orders in place to be done on Monday the 20th she missed previous lab appointment.  Next Appointment : 6 months for medical management of chronic issues.Fasting Labs and recheck Blood pressure on July 09, 2021.   Caesar Bookmaninah C Lander Eslick, NP

## 2021-07-09 ENCOUNTER — Other Ambulatory Visit: Payer: Medicare Other

## 2021-07-09 ENCOUNTER — Other Ambulatory Visit: Payer: Self-pay

## 2021-07-10 LAB — COMPLETE METABOLIC PANEL WITH GFR
AG Ratio: 1.6 (calc) (ref 1.0–2.5)
ALT: 16 U/L (ref 6–29)
AST: 19 U/L (ref 10–35)
Albumin: 4.5 g/dL (ref 3.6–5.1)
Alkaline phosphatase (APISO): 56 U/L (ref 37–153)
BUN: 9 mg/dL (ref 7–25)
CO2: 27 mmol/L (ref 20–32)
Calcium: 9.8 mg/dL (ref 8.6–10.4)
Chloride: 104 mmol/L (ref 98–110)
Creat: 0.65 mg/dL (ref 0.60–0.95)
Globulin: 2.8 g/dL (calc) (ref 1.9–3.7)
Glucose, Bld: 98 mg/dL (ref 65–99)
Potassium: 3.4 mmol/L — ABNORMAL LOW (ref 3.5–5.3)
Sodium: 143 mmol/L (ref 135–146)
Total Bilirubin: 0.3 mg/dL (ref 0.2–1.2)
Total Protein: 7.3 g/dL (ref 6.1–8.1)
eGFR: 88 mL/min/{1.73_m2} (ref >=60)

## 2021-07-10 LAB — CBC WITH DIFFERENTIAL/PLATELET
Absolute Monocytes: 707 cells/uL (ref 200–950)
Basophils Absolute: 46 cells/uL (ref 0–200)
Basophils Relative: 0.4 %
Eosinophils Absolute: 148 cells/uL (ref 15–500)
Eosinophils Relative: 1.3 %
HCT: 38.5 % (ref 35.0–45.0)
Hemoglobin: 11.8 g/dL (ref 11.7–15.5)
Lymphs Abs: 2041 cells/uL (ref 850–3900)
MCH: 22.7 pg — ABNORMAL LOW (ref 27.0–33.0)
MCHC: 30.6 g/dL — ABNORMAL LOW (ref 32.0–36.0)
MCV: 74.2 fL — ABNORMAL LOW (ref 80.0–100.0)
MPV: 9.5 fL (ref 7.5–12.5)
Monocytes Relative: 6.2 %
Neutro Abs: 8459 cells/uL — ABNORMAL HIGH (ref 1500–7800)
Neutrophils Relative %: 74.2 %
Platelets: 395 10*3/uL (ref 140–400)
RBC: 5.19 10*6/uL — ABNORMAL HIGH (ref 3.80–5.10)
RDW: 14.3 % (ref 11.0–15.0)
Total Lymphocyte: 17.9 %
WBC: 11.4 10*3/uL — ABNORMAL HIGH (ref 3.8–10.8)

## 2021-07-10 LAB — LIPID PANEL
Cholesterol: 187 mg/dL (ref <200)
HDL: 51 mg/dL (ref >=50)
LDL Cholesterol (Calc): 108 mg/dL (calc) — ABNORMAL HIGH
Non-HDL Cholesterol (Calc): 136 mg/dL (calc) — ABNORMAL HIGH (ref <130)
Total CHOL/HDL Ratio: 3.7 (calc) (ref <5.0)
Triglycerides: 167 mg/dL — ABNORMAL HIGH (ref <150)

## 2021-07-10 LAB — TSH: TSH: 1.09 mIU/L (ref 0.40–4.50)

## 2021-07-13 ENCOUNTER — Other Ambulatory Visit: Payer: Self-pay | Admitting: Student

## 2021-07-13 DIAGNOSIS — M4316 Spondylolisthesis, lumbar region: Secondary | ICD-10-CM

## 2021-07-17 ENCOUNTER — Other Ambulatory Visit: Payer: Self-pay

## 2021-07-17 ENCOUNTER — Ambulatory Visit
Admission: RE | Admit: 2021-07-17 | Discharge: 2021-07-17 | Disposition: A | Discharge status: Home / Self Care | Payer: Medicare Other | Source: Ambulatory Visit | Attending: Student | Admitting: Student

## 2021-07-17 ENCOUNTER — Other Ambulatory Visit: Payer: Self-pay | Admitting: Student

## 2021-07-17 DIAGNOSIS — M4316 Spondylolisthesis, lumbar region: Secondary | ICD-10-CM

## 2021-07-17 NOTE — Discharge Instructions (Signed)

## 2021-07-20 ENCOUNTER — Telehealth: Payer: Medicare Other | Admitting: Family

## 2021-07-20 ENCOUNTER — Encounter: Payer: Self-pay | Admitting: Family

## 2021-07-20 ENCOUNTER — Encounter: Payer: Medicare Other | Admitting: Family

## 2021-07-22 NOTE — Progress Notes (Signed)
This encounter was created in error - please disregard. ?Rescheduled.  ?

## 2021-07-23 ENCOUNTER — Ambulatory Visit (INDEPENDENT_AMBULATORY_CARE_PROVIDER_SITE_OTHER): Payer: Medicare Other | Admitting: Family

## 2021-07-23 DIAGNOSIS — R300 Dysuria: Secondary | ICD-10-CM | POA: Diagnosis not present

## 2021-07-23 DIAGNOSIS — E782 Mixed hyperlipidemia: Secondary | ICD-10-CM | POA: Diagnosis not present

## 2021-07-23 DIAGNOSIS — E876 Hypokalemia: Secondary | ICD-10-CM | POA: Diagnosis not present

## 2021-07-23 DIAGNOSIS — D72829 Elevated white blood cell count, unspecified: Secondary | ICD-10-CM | POA: Diagnosis not present

## 2021-07-23 NOTE — Progress Notes (Signed)
? ?Provider: Marlowe Sax FNP-C ? ?Crystal Preston, Nelda Bucks, NP ? ?Patient Care Team: ?Wess Baney, Nelda Bucks, NP as PCP - General (Family Medicine) ?Sharyne Peach, MD as Consulting Physician (Ophthalmology) ? ?Extended Emergency Contact Information ?Primary Emergency Contact: [name],[name] ?Address: [address] ?         [place], [place] [address] [place] ?Home Phone: [phone] ?Mobile Phone: [phone] ?Relation: Daughter ? ?Code Status:  Full Code  ?Goals of care: Advanced Directive information ? ?  07/02/2021  ?  9:29 AM  ?Advanced Directives  ?Does Patient Have a Medical Advance Directive? No  ?Would patient like information on creating a medical advance directive? No - Patient declined  ? ? ? ?Chief Complaint  ?Patient presents with  ? lab results  ? ? ?HPI:  ?Pt is a 81 y.o. female seen today for an acute visit to discuss lab results.  She had labs done 07/09/2021 as follows: ? ?WBC 11.4 - status post x 2 cortisol injection prior to lab.  Also states has a burning sensation with urination.  She denies any fever or chills abdominal pain, nausea vomiting or flank pain. ?Also denies any symptoms of upper respiratory infection. ? ?Hyperlipidemia - TRG 167,LDL 108 previous LDL was 106; 116 ? ?Potassium 3.4  ? ? ? ?Past Medical History:  ?Diagnosis Date  ? GERD (gastroesophageal reflux disease)   ? HTN (hypertension)   ? Hyperlipidemia   ? Insomnia, unspecified   ? Obesity, unspecified   ? Osteoarthritis   ? Other abnormality of red blood cells   ? Microcytosis  ? Peripheral vascular disease, unspecified ([place])   ? Vertigo, benign positional   ? Head to left side  ? ?Past Surgical History:  ?Procedure Laterality Date  ? ABLATION ON ENDOMETRIOSIS  1980  ? CHOLECYSTECTOMY, LAPAROSCOPIC  1989  ? COLONOSCOPY  2008  ? COLONOSCOPY  2013  ? TOTAL ABDOMINAL HYSTERECTOMY  1971  ? ? ?Allergies  ?Allergen Reactions  ? Naproxen   ? Nitrofuran Derivatives Other (See Comments)  ?  Knots on body  ? Propoxyphene   ?   Darvocet  ? Azithromycin Other (See Comments)  ?  headache, excessive sleepiness  ? ? ?Outpatient Encounter Medications as of 07/23/2021  ?Medication Sig  ? acetaminophen (TYLENOL) 500 MG tablet Take 2 tablets (1,000 mg total) by mouth 2 (two) times daily as needed.  ? amLODipine (NORVASC) 5 MG tablet Take 1 tablet (5 mg total) by mouth daily.  ? aspirin 81 MG tablet Take 81 mg by mouth daily.  ? CALCIUM CITRATE PO Take 1 tablet (1000 mg ) by mouth daily  ? Cholecalciferol 4000 units TABS Take 1 tablet by mouth daily  ? Cod Liver Oil CAPS Take 1 tablet by mouth daily  ? fluticasone (FLONASE) 50 MCG/ACT nasal spray SPRAY 2 SPRAYS INTO EACH NOSTRIL EVERY DAY  ? ibandronate (BONIVA) 150 MG tablet TAKE 1 EVERY 30 DAYS IN THE AM WITH FULL GLASS OF WATER ON EMPTY STOMACH. DO NOT LIE DOWN X30 MINUTE  ? irbesartan (AVAPRO) 150 MG tablet Take 1 tablet (150 mg total) by mouth 2 (two) times daily.  ? Naphazoline HCl (CLEAR EYES OP) Apply to eye as needed.   ? pantoprazole (PROTONIX) 40 MG tablet TAKE 1 TABLET BY MOUTH EVERY DAY  ? pravastatin (PRAVACHOL) 40 MG tablet TAKE 1 TABLET BY MOUTH EVERY DAY  ? ?No facility-administered encounter medications on file as of 07/23/2021.  ? ? ?Review of Systems  ?Constitutional:  Negative for appetite change, chills, fatigue, fever and unexpected weight change.  ?HENT:  Negative for congestion, dental problem, ear discharge, ear pain, facial swelling, hearing loss, nosebleeds, postnasal drip, rhinorrhea, sinus pressure, sinus pain, sneezing, sore throat, tinnitus and trouble swallowing.   ?Eyes:  Negative for pain, discharge, redness, itching and visual disturbance.  ?Respiratory:  Negative for cough, chest tightness, shortness of breath and wheezing.   ?Cardiovascular:  Negative for chest pain, palpitations and leg swelling.  ?Gastrointestinal:  Negative for abdominal distention, abdominal pain, blood in stool, constipation, diarrhea, nausea and vomiting.  ?Endocrine: Negative for cold  intolerance, heat intolerance, polydipsia, polyphagia and polyuria.  ?Genitourinary:  Positive for dysuria. Negative for difficulty urinating, flank pain, frequency and urgency.  ?Musculoskeletal:  Negative for arthralgias, back pain, gait problem, joint swelling, myalgias, neck pain and neck stiffness.  ?Skin:  Negative for color change, pallor, rash and wound.  ?Neurological:  Negative for dizziness, syncope, speech difficulty, weakness, light-headedness, numbness and headaches.  ?Hematological:  Does not bruise/bleed easily.  ?Psychiatric/Behavioral:  Negative for agitation, behavioral problems, confusion, hallucinations, self-injury, sleep disturbance and suicidal ideas. The patient is not nervous/anxious.   ? ?Immunization History  ?Administered Date(s) Administered  ? Influenza, High Dose Seasonal PF 02/14/2021  ? Influenza,inj,Quad PF,6+ Mos 02/28/2016  ? PFIZER(Purple Top)SARS-COV-2 Vaccination 06/18/2019, 07/13/2019, 02/09/2020  ? Pneumococcal Conjugate-13 05/24/2016  ? ?Pertinent  Health Maintenance Due  ?Topic Date Due  ? DEXA SCAN  Completed  ? INFLUENZA VACCINE  Discontinued  ? ? ?  08/28/2020  ?  1:13 PM 12/29/2020  ?  5:17 PM 04/03/2021  ?  3:23 PM 07/02/2021  ?  9:29 AM 07/02/2021  ? 12:56 PM  ?Fall Risk  ?Falls in the past year? 0 0 0 0 0  ?Was there an injury with Fall? 0 0 0 0 0  ?Fall Risk Category Calculator 0 0 0 0 0  ?Fall Risk Category Low Low Low Low Low  ?Patient Fall Risk Level Low fall risk Low fall risk Low fall risk Low fall risk Low fall risk  ?Patient at Risk for Falls Due to  No Fall Risks No Fall Risks No Fall Risks No Fall Risks  ?Fall risk Follow up  Falls evaluation completed Falls evaluation completed Falls evaluation completed Falls evaluation completed  ? ?Functional Status Survey: ?  ? ?There were no vitals filed for this visit. ?There is no height or weight on file to calculate BMI. ? ?Physical Exam ?Unable to complete on Telephone  ? ?Labs reviewed: ?Recent Labs  ?   01/01/21 ?1414 07/09/21 ?1343  ?NA 138 143  ?K 3.6 3.4*  ?CL 104 104  ?CO2 24 27  ?GLUCOSE 89 98  ?BUN 12 9  ?CREATININE 0.62 0.65  ?CALCIUM 9.6 9.8  ? ?Recent Labs  ?  01/01/21 ?1414 07/09/21 ?1343  ?AST 18 19  ?ALT 15 16  ?BILITOT 0.4 0.3  ?PROT 7.3 7.3  ? ?Recent Labs  ?  01/01/21 ?1414 07/09/21 ?1343  ?WBC 10.5 11.4*  ?NEUTROABS 7,424 8,459*  ?HGB 11.8 11.8  ?HCT 38.4 38.5  ?MCV 75.4* 74.2*  ?PLT 376 395  ? ?Lab Results  ?Component Value Date  ? TSH 1.09 07/09/2021  ? ?Lab Results  ?Component Value Date  ? HGBA1C 5.9 (H) 01/08/2019  ? ?Lab Results  ?Component Value Date  ? CHOL 187 07/09/2021  ? HDL 51 07/09/2021  ? LDLCALC 108 (H) 07/09/2021  ? TRIG 167 (H) 07/09/2021  ? CHOLHDL 3.7 07/09/2021  ? ? ?  Significant Diagnostic Results in last 30 days:  ?MR LUMBAR SPINE WO CONTRAST ? ?Result Date: 06/27/2021 ?CLINICAL DATA:  Lumbar spondylolisthesis. Chronic low back pain. Numbness in the legs. EXAM: MRI LUMBAR SPINE WITHOUT CONTRAST TECHNIQUE: Multiplanar, multisequence MR imaging of the lumbar spine was performed. No intravenous contrast was administered. COMPARISON:  Lumbar spine MRI 07/27/2019 FINDINGS: Segmentation:  Standard. Alignment: Chronic straightening of the normal lumbar lordosis, mild lumbar levoscoliosis, and minimal anterolisthesis of L2 on L3 and L4 on L5. Vertebrae: No fracture, suspicious marrow lesion, or significant marrow edema. Conus medullaris and cauda equina: Conus extends to the L1 level. Conus and cauda equina appear normal. Paraspinal and other soft tissues: 1 cm T1 and T2 hyperintense focus in the upper pole of the right kidney, incompletely evaluated but may reflect a proteinaceous or hemorrhagic cyst. Disc levels: Disc desiccation and mild disc space narrowing from L2-3 to L5-S1. Diffuse congenital narrowing of the lumbar spinal canal due to short pedicles. T11-12: Only imaged sagittally. Mild disc bulging without evidence of significant stenosis. T12-L1: Negative. L1-2: Minimal disc  bulging, stable to minimally increased. Mild facet and ligamentum flavum hypertrophy. No stenosis. L2-3: Disc bulging, a broad central disc extrusion with slight cranial migration, mildly prominent dorsal epidural fat,

## 2021-07-31 ENCOUNTER — Other Ambulatory Visit: Payer: Medicare Other

## 2021-07-31 DIAGNOSIS — D72829 Elevated white blood cell count, unspecified: Secondary | ICD-10-CM

## 2021-07-31 DIAGNOSIS — R300 Dysuria: Secondary | ICD-10-CM

## 2021-07-31 DIAGNOSIS — E876 Hypokalemia: Secondary | ICD-10-CM

## 2021-08-01 LAB — CBC WITH DIFFERENTIAL/PLATELET
Absolute Monocytes: 1067 cells/uL — ABNORMAL HIGH (ref 200–950)
Basophils Absolute: 41 cells/uL (ref 0–200)
Basophils Relative: 0.3 %
Eosinophils Absolute: 230 cells/uL (ref 15–500)
Eosinophils Relative: 1.7 %
HCT: 38.8 % (ref 35.0–45.0)
Hemoglobin: 12 g/dL (ref 11.7–15.5)
Lymphs Abs: 2646 cells/uL (ref 850–3900)
MCH: 22.9 pg — ABNORMAL LOW (ref 27.0–33.0)
MCHC: 30.9 g/dL — ABNORMAL LOW (ref 32.0–36.0)
MCV: 73.9 fL — ABNORMAL LOW (ref 80.0–100.0)
MPV: 9.4 fL (ref 7.5–12.5)
Monocytes Relative: 7.9 %
Neutro Abs: 9518 cells/uL — ABNORMAL HIGH (ref 1500–7800)
Neutrophils Relative %: 70.5 %
Platelets: 435 10*3/uL — ABNORMAL HIGH (ref 140–400)
RBC: 5.25 10*6/uL — ABNORMAL HIGH (ref 3.80–5.10)
RDW: 14.2 % (ref 11.0–15.0)
Total Lymphocyte: 19.6 %
WBC: 13.5 10*3/uL — ABNORMAL HIGH (ref 3.8–10.8)

## 2021-08-01 LAB — BASIC METABOLIC PANEL WITH GFR
BUN: 21 mg/dL (ref 7–25)
CO2: 25 mmol/L (ref 20–32)
Calcium: 10 mg/dL (ref 8.6–10.4)
Chloride: 104 mmol/L (ref 98–110)
Creat: 0.81 mg/dL (ref 0.60–0.95)
Glucose, Bld: 126 mg/dL (ref 65–139)
Potassium: 4 mmol/L (ref 3.5–5.3)
Sodium: 140 mmol/L (ref 135–146)
eGFR: 73 mL/min/{1.73_m2} (ref >=60)

## 2021-08-02 LAB — URINE CULTURE
MICRO NUMBER:: 13248484
SPECIMEN QUALITY:: ADEQUATE

## 2021-08-03 ENCOUNTER — Other Ambulatory Visit: Payer: Self-pay

## 2021-08-03 MED ORDER — CIPROFLOXACIN HCL 500 MG PO TABS
500.0000 mg | ORAL_TABLET | Freq: Two times a day (BID) | ORAL | 0 refills | Status: AC
Start: 2021-08-03 — End: 2021-08-10

## 2021-08-03 MED ORDER — SACCHAROMYCES BOULARDII 250 MG PO CAPS: 250.0000 mg | ORAL_CAPSULE | Freq: Two times a day (BID) | ORAL | 0 refills | Status: AC

## 2021-09-08 ENCOUNTER — Other Ambulatory Visit: Payer: Self-pay | Admitting: Family

## 2021-09-08 DIAGNOSIS — I10 Essential (primary) hypertension: Secondary | ICD-10-CM

## 2021-09-12 ENCOUNTER — Encounter: Payer: Self-pay | Admitting: Family

## 2021-09-12 ENCOUNTER — Ambulatory Visit (INDEPENDENT_AMBULATORY_CARE_PROVIDER_SITE_OTHER): Payer: Medicare Other | Admitting: Family

## 2021-09-12 ENCOUNTER — Telehealth: Payer: Self-pay

## 2021-09-12 VITALS — BP 90/70 | HR 78 | Temp 97.8°F | Resp 16 | Ht 60.0 in | Wt 184.3 lb

## 2021-09-12 DIAGNOSIS — I10 Essential (primary) hypertension: Secondary | ICD-10-CM

## 2021-09-12 NOTE — Telephone Encounter (Signed)
Patient walked into office with concerns of Blood pressure. Patient states that she's been having high blood pressure for the past 2-3 days. Patient doesn't have blood pressure monitor at home.Patient states she took an extra blood pressure pill. Patient currently takes Amlodipine 5mg , and Irbesartan 150mg . Patient vitals are as follows.   BP: 90/70 Temp: 97.8 Ox: 96 Pulse: 78 Resp: 16 Weight: 184.3lbs  I checked patient into room and she decided that she wanted to go home and lay down. I advised patient to stay and be seen. Patient refused and left. I told patient that I would inform PCP [name], Dinah C, NP of our encounter. Message routed to PCP.

## 2021-09-12 NOTE — Telephone Encounter (Signed)
Patient called and notified.

## 2021-09-12 NOTE — Progress Notes (Signed)
Patient did not want to be seen after [facility] checked her vital signs.told CMA she was going home to sleep.

## 2021-09-12 NOTE — Telephone Encounter (Signed)
Advise to schedule appointment if B/p still high at home.call provider prior to taking extra blood pressure medication.

## 2021-09-13 ENCOUNTER — Emergency Department (HOSPITAL_BASED_OUTPATIENT_CLINIC_OR_DEPARTMENT_OTHER): Payer: Medicare Other

## 2021-09-13 ENCOUNTER — Encounter (HOSPITAL_BASED_OUTPATIENT_CLINIC_OR_DEPARTMENT_OTHER): Payer: Self-pay | Admitting: Emergency Medicine

## 2021-09-13 ENCOUNTER — Emergency Department (HOSPITAL_BASED_OUTPATIENT_CLINIC_OR_DEPARTMENT_OTHER)
Admission: EM | Admit: 2021-09-13 | Discharge: 2021-09-13 | Disposition: A | Discharge status: Home / Self Care | Payer: Medicare Other | Attending: Emergency Medicine | Admitting: Emergency Medicine

## 2021-09-13 ENCOUNTER — Other Ambulatory Visit: Payer: Self-pay

## 2021-09-13 DIAGNOSIS — Z79899 Other long term (current) drug therapy: Secondary | ICD-10-CM | POA: Insufficient documentation

## 2021-09-13 DIAGNOSIS — Z7982 Long term (current) use of aspirin: Secondary | ICD-10-CM | POA: Diagnosis not present

## 2021-09-13 DIAGNOSIS — N3000 Acute cystitis without hematuria: Secondary | ICD-10-CM

## 2021-09-13 DIAGNOSIS — I10 Essential (primary) hypertension: Secondary | ICD-10-CM | POA: Diagnosis not present

## 2021-09-13 DIAGNOSIS — R42 Dizziness and giddiness: Secondary | ICD-10-CM | POA: Diagnosis present

## 2021-09-13 LAB — CBC WITH DIFFERENTIAL/PLATELET
Abs Immature Granulocytes: 0.07 10*3/uL (ref 0.00–0.07)
Basophils Absolute: 0.1 10*3/uL (ref 0.0–0.1)
Basophils Relative: 0 %
Eosinophils Absolute: 0.2 10*3/uL (ref 0.0–0.5)
Eosinophils Relative: 2 %
HCT: 38.4 % (ref 36.0–46.0)
Hemoglobin: 12.2 g/dL (ref 12.0–15.0)
Immature Granulocytes: 1 %
Lymphocytes Relative: 33 %
Lymphs Abs: 4.1 10*3/uL — ABNORMAL HIGH (ref 0.7–4.0)
MCH: 23 pg — ABNORMAL LOW (ref 26.0–34.0)
MCHC: 31.8 g/dL (ref 30.0–36.0)
MCV: 72.5 fL — ABNORMAL LOW (ref 80.0–100.0)
Monocytes Absolute: 0.9 10*3/uL (ref 0.1–1.0)
Monocytes Relative: 8 %
Neutro Abs: 7 10*3/uL (ref 1.7–7.7)
Neutrophils Relative %: 56 %
Platelets: 389 10*3/uL (ref 150–400)
RBC: 5.3 MIL/uL — ABNORMAL HIGH (ref 3.87–5.11)
RDW: 15 % (ref 11.5–15.5)
WBC: 12.3 10*3/uL — ABNORMAL HIGH (ref 4.0–10.5)
nRBC: 0 % (ref 0.0–0.2)

## 2021-09-13 LAB — COMPREHENSIVE METABOLIC PANEL
ALT: 19 U/L (ref 0–44)
AST: 21 U/L (ref 15–41)
Albumin: 4.1 g/dL (ref 3.5–5.0)
Alkaline Phosphatase: 57 U/L (ref 38–126)
Anion gap: 9 (ref 5–15)
BUN: 16 mg/dL (ref 8–23)
CO2: 23 mmol/L (ref 22–32)
Calcium: 9.2 mg/dL (ref 8.9–10.3)
Chloride: 105 mmol/L (ref 98–111)
Creatinine, Ser: 0.68 mg/dL (ref 0.44–1.00)
GFR, Estimated: >60 mL/min (ref >60)
Glucose, Bld: 134 mg/dL — ABNORMAL HIGH (ref 70–99)
Potassium: 3.6 mmol/L (ref 3.5–5.1)
Sodium: 137 mmol/L (ref 135–145)
Total Bilirubin: 0.4 mg/dL (ref 0.3–1.2)
Total Protein: 8 g/dL (ref 6.5–8.1)

## 2021-09-13 LAB — URINALYSIS, ROUTINE W REFLEX MICROSCOPIC
Bilirubin Urine: NEGATIVE
Glucose, UA: NEGATIVE mg/dL
Ketones, ur: NEGATIVE mg/dL
Nitrite: POSITIVE — AB
Protein, ur: NEGATIVE mg/dL
Specific Gravity, Urine: 1.02 (ref 1.005–1.030)
pH: 6 (ref 5.0–8.0)

## 2021-09-13 LAB — URINALYSIS, MICROSCOPIC (REFLEX)

## 2021-09-13 LAB — TROPONIN I (HIGH SENSITIVITY): Troponin I (High Sensitivity): 5 ng/L (ref <18)

## 2021-09-13 MED ORDER — CEPHALEXIN 250 MG PO CAPS
500.0000 mg | ORAL_CAPSULE | Freq: Once | ORAL | Status: AC
  Administered 2021-09-13: 500 mg via ORAL
  Filled 2021-09-13: qty 2

## 2021-09-13 MED ORDER — CEPHALEXIN 500 MG PO CAPS: 500.0000 mg | ORAL_CAPSULE | Freq: Four times a day (QID) | ORAL | 0 refills | Status: DC

## 2021-09-13 NOTE — ED Notes (Signed)
Pt ambulated to restroom to provide urine sample, insufficient amount for testing.

## 2021-09-13 NOTE — ED Provider Notes (Signed)
[facility] EMERGENCY DEPARTMENT Provider Note   CSN: [id number] Arrival date & time: 09/13/21  0341     History  Chief Complaint  Patient presents with   Dizziness    Crystal Preston is a 81 y.o. female.  The history is provided by the patient.  Dizziness Quality:  Lightheadedness Severity:  Mild Onset quality:  Gradual Duration:  1 week Timing:  Constant Progression:  Unchanged Chronicity:  New Context: physical activity and standing up   Relieved by:  Nothing Worsened by:  Nothing Ineffective treatments:  None tried Associated symptoms: no blood in stool, no chest pain, no diarrhea, no headaches, no hearing loss, no nausea, no palpitations, no shortness of breath, no syncope, no vision changes, no vomiting and no weakness   Risk factors: no hx of vertigo   Patient with HTN presents with a week of lightheadedness.  No associated symptoms.      Home Medications Prior to Admission medications   Medication Sig Start Date End Date Taking? Authorizing Provider  acetaminophen (TYLENOL) 500 MG tablet Take 2 tablets (1,000 mg total) by mouth 2 (two) times daily as needed. 02/20/21   [name], Dinah C, NP  amLODipine (NORVASC) 5 MG tablet Take 1 tablet (5 mg total) by mouth daily. 07/02/21   [name], Dinah C, NP  aspirin 81 MG tablet Take 81 mg by mouth daily.    [provider]  CALCIUM CITRATE PO Take 1 tablet (1000 mg ) by mouth daily    [provider]  Cholecalciferol 4000 units TABS Take 1 tablet by mouth daily    [provider]  Cod Liver Oil CAPS Take 1 tablet by mouth daily    [provider]  fluticasone (FLONASE) 50 MCG/ACT nasal spray SPRAY 2 SPRAYS INTO EACH NOSTRIL EVERY DAY 01/02/21   [name], Dinah C, NP  ibandronate (BONIVA) 150 MG tablet TAKE 1 EVERY 30 DAYS IN THE AM WITH FULL GLASS OF WATER ON EMPTY STOMACH. DO NOT LIE DOWN X30 MINUTE 03/19/21   [name], Dinah C, NP  irbesartan (AVAPRO) 150 MG tablet TAKE 1 TABLET BY  MOUTH TWICE A DAY 09/10/21   [name], Dinah C, NP  Naphazoline HCl (CLEAR EYES OP) Apply to eye as needed.     [provider]  pantoprazole (PROTONIX) 40 MG tablet TAKE 1 TABLET BY MOUTH EVERY DAY 06/29/21   [name], Dinah C, NP  pravastatin (PRAVACHOL) 40 MG tablet TAKE 1 TABLET BY MOUTH EVERY DAY 06/25/21   [name], Dinah C, NP  saccharomyces boulardii (FLORASTOR) 250 MG capsule Take 1 capsule (250 mg total) by mouth 2 (two) times daily. Take along with Cipro 500mg  to prevent any diarrhea associated with antibiotic. 08/03/21   [name], Dinah C, NP      Allergies    Naproxen, Nitrofuran derivatives, Propoxyphene, and Azithromycin    Review of Systems   Review of Systems  Constitutional:  Negative for fever.  HENT:  Negative for hearing loss.   Eyes:  Negative for redness.  Respiratory:  Negative for shortness of breath, wheezing and stridor.   Cardiovascular:  Negative for chest pain, palpitations and syncope.  Gastrointestinal:  Negative for blood in stool, diarrhea, nausea and vomiting.  Neurological:  Positive for light-headedness. Negative for facial asymmetry, weakness and headaches.   Physical Exam Updated Vital Signs BP (!) 148/72   Pulse 69   Temp 99.3 F (37.4 C) (Oral)   Resp (!) 21   Ht 5' (1.524 m)   Wt  84 kg   SpO2 95%   BMI 36.17 kg/m  Physical Exam Vitals and nursing note reviewed.  Constitutional:      General: She is not in acute distress.    Appearance: Normal appearance.  HENT:     Head: Normocephalic and atraumatic.     Nose: Nose normal.  Eyes:     Conjunctiva/sclera: Conjunctivae normal.     Pupils: Pupils are equal, round, and reactive to light.  Cardiovascular:     Rate and Rhythm: Normal rate and regular rhythm.     Pulses: Normal pulses.     Heart sounds: Normal heart sounds.  Pulmonary:     Effort: Pulmonary effort is normal.     Breath sounds: Normal breath sounds.  Abdominal:     General: Abdomen is flat. Bowel sounds are normal.      Palpations: Abdomen is soft.     Tenderness: There is no abdominal tenderness. There is no guarding.  Musculoskeletal:        General: Normal range of motion.     Cervical back: Normal range of motion and neck supple.  Skin:    General: Skin is warm and dry.     Capillary Refill: Capillary refill takes less than 2 seconds.  Neurological:     General: No focal deficit present.     Mental Status: She is alert and oriented to person, place, and time.     Deep Tendon Reflexes: Reflexes normal.  Psychiatric:        Mood and Affect: Mood normal.        Behavior: Behavior normal.    ED Results / Procedures / Treatments   Labs (all labs ordered are listed, but only abnormal results are displayed) Results for orders placed or performed during the hospital encounter of 09/13/21  CBC with Differential  Result Value Ref Range   WBC 12.3 (H) 4.0 - 10.5 K/uL   RBC 5.30 (H) 3.87 - 5.11 MIL/uL   Hemoglobin 12.2 12.0 - 15.0 g/dL   HCT 38.4 36.0 - 46.0 %   MCV 72.5 (L) 80.0 - 100.0 fL   MCH 23.0 (L) 26.0 - 34.0 pg   MCHC 31.8 30.0 - 36.0 g/dL   RDW 15.0 11.5 - 15.5 %   Platelets 389 150 - 400 K/uL   nRBC 0.0 0.0 - 0.2 %   Neutrophils Relative % 56 %   Neutro Abs 7.0 1.7 - 7.7 K/uL   Lymphocytes Relative 33 %   Lymphs Abs 4.1 (H) 0.7 - 4.0 K/uL   Monocytes Relative 8 %   Monocytes Absolute 0.9 0.1 - 1.0 K/uL   Eosinophils Relative 2 %   Eosinophils Absolute 0.2 0.0 - 0.5 K/uL   Basophils Relative 0 %   Basophils Absolute 0.1 0.0 - 0.1 K/uL   Immature Granulocytes 1 %   Abs Immature Granulocytes 0.07 0.00 - 0.07 K/uL  Comprehensive metabolic panel  Result Value Ref Range   Sodium 137 135 - 145 mmol/L   Potassium 3.6 3.5 - 5.1 mmol/L   Chloride 105 98 - 111 mmol/L   CO2 23 22 - 32 mmol/L   Glucose, Bld 134 (H) 70 - 99 mg/dL   BUN 16 8 - 23 mg/dL   Creatinine, Ser 0.68 0.44 - 1.00 mg/dL   Calcium 9.2 8.9 - 10.3 mg/dL   Total Protein 8.0 6.5 - 8.1 g/dL   Albumin 4.1 3.5 - 5.0  g/dL   AST 21 15 - 41 U/L  ALT 19 0 - 44 U/L   Alkaline Phosphatase 57 38 - 126 U/L   Total Bilirubin 0.4 0.3 - 1.2 mg/dL   GFR, Estimated >60 >60 mL/min   Anion gap 9 5 - 15  Troponin I (High Sensitivity)  Result Value Ref Range   Troponin I (High Sensitivity) 5 <18 ng/L   DG Chest Portable 1 View  Result Date: 09/13/2021 CLINICAL DATA:  Lightheadedness. EXAM: PORTABLE CHEST 1 VIEW COMPARISON:  03/11/2017 FINDINGS: 0437 hours. Mild asymmetric elevation right hemidiaphragm, similar to prior. SIRT normal lungs Cardiopericardial silhouette is at upper limits of normal for size. The visualized bony structures of the thorax are unremarkable. Telemetry leads overlie the chest. IMPRESSION: Stable exam. No acute cardiopulmonary findings. Electronically Signed   By: Misty Stanley M.D.   On: 09/13/2021 05:06     EKG EKG Interpretation  Date/Time:  Thursday Sep 13 2021 03:52:11 EDT Ventricular Rate:  81 PR Interval:  155 QRS Duration: 79 QT Interval:  413 QTC Calculation: 480 R Axis:   9 Text Interpretation: Sinus rhythm Confirmed by Dory Horn) on 09/13/2021 3:59:56 AM  Radiology DG Chest Portable 1 View  Result Date: 09/13/2021 CLINICAL DATA:  Lightheadedness. EXAM: PORTABLE CHEST 1 VIEW COMPARISON:  03/11/2017 FINDINGS: 0437 hours. Mild asymmetric elevation right hemidiaphragm, similar to prior. S[facility] normal lungs Cardiopericardial silhouette is at upper limits of normal for size. The visualized bony structures of the thorax are unremarkable. Telemetry leads overlie the chest. IMPRESSION: Stable exam. No acute cardiopulmonary findings. Electronically Signed   By: Misty Stanley M.D.   On: 09/13/2021 05:06    Procedures Procedures    Medications Ordered in ED Medications - No data to display  ED Course/ Medical Decision Making/ A&P                           Medical Decision Making Lightheadeness for a week  Amount and/or Complexity of Data Reviewed Independent  Historian:     Details: family member see above External Data Reviewed: notes.    Details: previous notes reviewed Labs: ordered.    Details: all labs reviewed by me: hemoglobin 12.2 normal platetes 389k, normal troponin 5.  normal sodium 137, normal potassium 3.6, normal BUN and creatinine .68.  normal liver functions panel. Radiology: ordered and independent interpretation performed.    Details: normal CXR by me  Risk Prescription drug management. Risk Details: See orthostatic vitals, patient is not orthostatic.  Patient has a UTI.  Urine was cultured and antibiotics initiated.  No signs of sepsis.  Stable for discharge with close follow up.  Strict return precautions given.       Final Clinical Impression(s) / ED Diagnoses Final diagnoses:  None  Return for intractable cough, coughing up blood, fevers > 100.4 unrelieved by medication, shortness of breath, intractable vomiting, chest pain, shortness of breath, weakness, numbness, changes in speech, facial asymmetry, abdominal pain, passing out, Inability to tolerate liquids or food, cough, altered mental status or any concerns. No signs of systemic illness or infection. The patient is nontoxic-appearing on exam and vital signs are within normal limits.  I have reviewed the triage vital signs and the nursing notes. Pertinent labs & imaging results that were available during my care of the patient were reviewed by me and considered in my medical decision making (see chart for details). After history, exam, and medical workup I feel the patient has been appropriately medically screened and is safe for  discharge home. Pertinent diagnoses were discussed with the patient. Patient was given return precautions.   Rx / DC Orders ED Discharge Orders     None         Marisella Puccio, MD 09/13/21 [phone]

## 2021-09-13 NOTE — ED Triage Notes (Signed)
Pt reports feeling light headed x 5 days. Denies any pain.

## 2021-09-15 LAB — URINE CULTURE
Culture: >=100000 — AB
Special Requests: NORMAL

## 2021-09-16 ENCOUNTER — Telehealth (HOSPITAL_BASED_OUTPATIENT_CLINIC_OR_DEPARTMENT_OTHER): Payer: Self-pay | Admitting: *Deleted

## 2021-09-16 NOTE — Telephone Encounter (Signed)
Post ED Visit - Positive Culture Follow-up  Culture report reviewed by antimicrobial stewardship pharmacist: [facility] []  , Pharm.D. []  Enzo Bi, Pharm.D., BCPS AQ-ID []  , Pharm.D., BCPS []  Celedonio Miyamoto, Pharm.D., BCPS []  Juliaetta, Garvin Fila.D., BCPS, AAHIVP []  , Pharm.D., BCPS, AAHIVP []  Georgina Pillion, PharmD, BCPS []  , PharmD, BCPS []  [place], PharmD, BCPS []  [address], PharmD []  , PharmD, BCPS [x]  Estella Husk, PharmD  Pharmacy Team []  Lysle Pearl, PharmD []  , PharmD []  Phillips Climes, PharmD []  , Rph []  Agapito Games) , PharmD []  Verlan Friends, PharmD []  , PharmD []  Mervyn Gay, PharmD []  , PharmD []  Daylene Posey, PharmD []  [facility], PharmD []  , PharmD []  Len Childs, PharmD   Positive urine culture Treated with Cephalexin, organism sensitive to the same and no further patient follow-up is required at this time.  09/16/2021, 11:53 AM

## 2021-09-28 ENCOUNTER — Other Ambulatory Visit: Payer: Self-pay | Admitting: Family

## 2021-09-28 DIAGNOSIS — I10 Essential (primary) hypertension: Secondary | ICD-10-CM

## 2021-10-24 ENCOUNTER — Telehealth: Payer: Self-pay

## 2021-10-24 DIAGNOSIS — H9311 Tinnitus, right ear: Secondary | ICD-10-CM | POA: Insufficient documentation

## 2021-10-24 NOTE — Telephone Encounter (Signed)
ENT referral ordered.

## 2021-10-24 NOTE — Telephone Encounter (Signed)
Patient states she continues having problems with her ears and Dr. Ezzard Standing is retired. Patient requesting Dinah refer her to someone urgently.

## 2021-10-25 NOTE — Telephone Encounter (Signed)
Called patient. No answer. Left message on voicemail referral has been placed.

## 2021-12-10 ENCOUNTER — Other Ambulatory Visit: Payer: Self-pay | Admitting: Family

## 2021-12-10 DIAGNOSIS — I10 Essential (primary) hypertension: Secondary | ICD-10-CM

## 2021-12-14 ENCOUNTER — Other Ambulatory Visit: Payer: Self-pay | Admitting: Family

## 2021-12-14 DIAGNOSIS — E782 Mixed hyperlipidemia: Secondary | ICD-10-CM

## 2021-12-14 DIAGNOSIS — G8929 Other chronic pain: Secondary | ICD-10-CM

## 2021-12-31 NOTE — Progress Notes (Unsigned)
Provider: Richarda Blade FNP-C   Jimy Gates, Donalee Citrin, NP  Patient Care Team: Travonna Swindle, Donalee Citrin, NP as PCP - General (Family Medicine) Elise Benne, MD as Consulting Physician (Ophthalmology)  Extended Emergency Contact Information Primary Emergency Contact: [name],[name] Address: [address]          [place], [place] [address] [name] Home Phone: [phone] Mobile Phone: [phone] Relation: Daughter  Code Status:  *** Goals of care: Advanced Directive information    09/13/2021    3:51 AM  Advanced Directives  Does Patient Have a Medical Advance Directive? No     No chief complaint on file.   HPI:  Pt is a 81 y.o. female seen today for medical management of chronic diseases.     Past Medical History:  Diagnosis Date   GERD (gastroesophageal reflux disease)    HTN (hypertension)    Hyperlipidemia    Insomnia, unspecified    Obesity, unspecified    Osteoarthritis    Other abnormality of red blood cells    Microcytosis   Peripheral vascular disease, unspecified (HCC)    Vertigo, benign positional    Head to left side   Past Surgical History:  Procedure Laterality Date   ABLATION ON ENDOMETRIOSIS  1980   CHOLECYSTECTOMY, LAPAROSCOPIC  1989   COLONOSCOPY  2008   COLONOSCOPY  2013   TOTAL ABDOMINAL HYSTERECTOMY  1971    Allergies  Allergen Reactions   Naproxen    Nitrofuran Derivatives Other (See Comments)    Knots on body   Propoxyphene     Darvocet   Azithromycin Other (See Comments)    headache, excessive sleepiness    Allergies as of 01/01/2022       Reactions   Naproxen    Nitrofuran Derivatives Other (See Comments)   Knots on body   Propoxyphene    Darvocet   Azithromycin Other (See Comments)   headache, excessive sleepiness        Medication List        Accurate as of December 31, 2021  4:03 PM. If you have any questions, ask your nurse or doctor.          Acetaminophen Extra Strength 500 MG  Tabs TAKE 2 TABLETS (1,000 MG TOTAL) BY MOUTH 2 (TWO) TIMES DAILY AS NEEDED.   amLODipine 5 MG tablet Commonly known as: NORVASC TAKE 1 TABLET (5 MG TOTAL) BY MOUTH DAILY.   aspirin 81 MG tablet Take 81 mg by mouth daily.   CALCIUM CITRATE PO Take 1 tablet (1000 mg ) by mouth daily   cephALEXin 500 MG capsule Commonly known as: KEFLEX Take 1 capsule (500 mg total) by mouth 4 (four) times daily.   Cholecalciferol 100 MCG (4000 UT) Tabs Take 1 tablet by mouth daily   CLEAR EYES OP Apply to eye as needed.   Cod Liver Oil Caps Take 1 tablet by mouth daily   fluticasone 50 MCG/ACT nasal spray Commonly known as: FLONASE SPRAY 2 SPRAYS INTO EACH NOSTRIL EVERY DAY   ibandronate 150 MG tablet Commonly known as: BONIVA TAKE 1 EVERY 30 DAYS IN THE AM WITH FULL GLASS OF WATER ON EMPTY STOMACH. DO NOT LIE DOWN X30 MINUTE   irbesartan 150 MG tablet Commonly known as: AVAPRO TAKE 1 TABLET BY MOUTH TWICE A DAY   pantoprazole 40 MG tablet Commonly known as: PROTONIX TAKE 1 TABLET BY MOUTH EVERY DAY   pravastatin 40 MG tablet Commonly known as: PRAVACHOL TAKE 1  TABLET BY MOUTH EVERY DAY   saccharomyces boulardii 250 MG capsule Commonly known as: Florastor Take 1 capsule (250 mg total) by mouth 2 (two) times daily. Take along with Cipro 500mg  to prevent any diarrhea associated with antibiotic.        Review of Systems  Immunization History  Administered Date(s) Administered   Influenza, High Dose Seasonal PF 02/14/2021   Influenza,inj,Quad PF,6+ Mos 02/28/2016   PFIZER(Purple Top)SARS-COV-2 Vaccination 06/18/2019, 07/13/2019, 02/09/2020   Pneumococcal Conjugate-13 05/24/2016   Pertinent  Health Maintenance Due  Topic Date Due   DEXA SCAN  Completed   INFLUENZA VACCINE  Discontinued      12/29/2020    5:17 PM 04/03/2021    3:23 PM 07/02/2021    9:29 AM 07/02/2021   12:56 PM 09/13/2021    3:51 AM  Fall Risk  Falls in the past year? 0 0 0 0   Was there an injury with  Fall? 0 0 0 0   Fall Risk Category Calculator 0 0 0 0   Fall Risk Category Low Low Low Low   Patient Fall Risk Level Low fall risk Low fall risk Low fall risk Low fall risk Low fall risk  Patient at Risk for Falls Due to No Fall Risks No Fall Risks No Fall Risks No Fall Risks   Fall risk Follow up Falls evaluation completed Falls evaluation completed Falls evaluation completed Falls evaluation completed    Functional Status Survey:    There were no vitals filed for this visit. There is no height or weight on file to calculate BMI. Physical Exam  Labs reviewed: Recent Labs    07/09/21 1343 07/31/21 1345 09/13/21 0355  NA 143 140 137  K 3.4* 4.0 3.6  CL 104 104 105  CO2 27 25 23   GLUCOSE 98 126 134*  BUN 9 21 16   CREATININE 0.65 0.81 0.68  CALCIUM 9.8 10.0 9.2   Recent Labs    01/01/21 1414 07/09/21 1343 09/13/21 0355  AST 18 19 21   ALT 15 16 19   ALKPHOS  --   --  57  BILITOT 0.4 0.3 0.4  PROT 7.3 7.3 8.0  ALBUMIN  --   --  4.1   Recent Labs    07/09/21 1343 07/31/21 1345 09/13/21 0355  WBC 11.4* 13.5* 12.3*  NEUTROABS 8,459* 9,518* 7.0  HGB 11.8 12.0 12.2  HCT 38.5 38.8 38.4  MCV 74.2* 73.9* 72.5*  PLT 395 435* 389   Lab Results  Component Value Date   TSH 1.09 07/09/2021   Lab Results  Component Value Date   HGBA1C 5.9 (H) 01/08/2019   Lab Results  Component Value Date   CHOL 187 07/09/2021   HDL 51 07/09/2021   LDLCALC 108 (H) 07/09/2021   TRIG 167 (H) 07/09/2021   CHOLHDL 3.7 07/09/2021    Significant Diagnostic Results in last 30 days:  No results found.  Assessment/Plan 1. Essential hypertension ***  2. Mixed hyperlipidemia ***  3. Gastroesophageal reflux disease without esophagitis ***  4. Primary osteoarthritis involving multiple joints ***  5. Age related osteoporosis, unspecified pathological fracture presence ***    Family/ staff Communication: Reviewed plan of care with patient  Labs/tests ordered: None   Next  Appointment :   07/11/2021, NP

## 2021-12-31 NOTE — Progress Notes (Unsigned)
This encounter was created in error - please disregard. No show

## 2022-01-01 ENCOUNTER — Encounter: Payer: Medicare Other | Admitting: Family

## 2022-01-01 DIAGNOSIS — M159 Polyosteoarthritis, unspecified: Secondary | ICD-10-CM

## 2022-01-01 DIAGNOSIS — I10 Essential (primary) hypertension: Secondary | ICD-10-CM

## 2022-01-01 DIAGNOSIS — K219 Gastro-esophageal reflux disease without esophagitis: Secondary | ICD-10-CM

## 2022-01-01 DIAGNOSIS — M810 Age-related osteoporosis without current pathological fracture: Secondary | ICD-10-CM

## 2022-01-01 DIAGNOSIS — E782 Mixed hyperlipidemia: Secondary | ICD-10-CM

## 2022-03-06 IMAGING — XA Imaging study
2 series · 2 of 2 positions shown · non-contrast
Comparison: none

CLINICAL DATA: Lumbosacral spondylosis without myelopathy.
Bilateral low back and right leg pain. 50% improvement for 3 weeks
following the epidural injection last month. Severe spinal stenosis
at L4-5.

[Series 1: ortho adipose · 1 of 1 slices shown (1 of 2)]
[im 1/1]
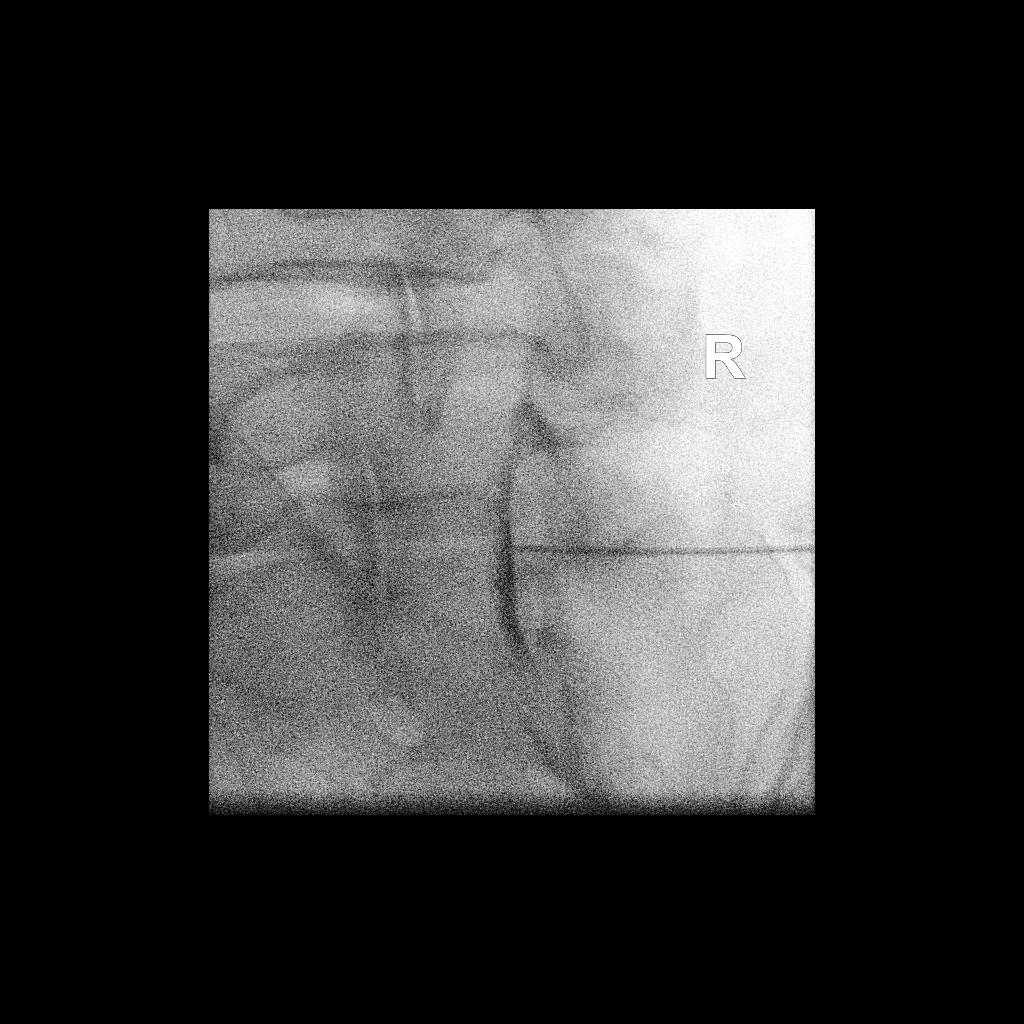

[Series 2: ortho adipose · 1 of 1 slices shown (2 of 2)]
[im 1/1]
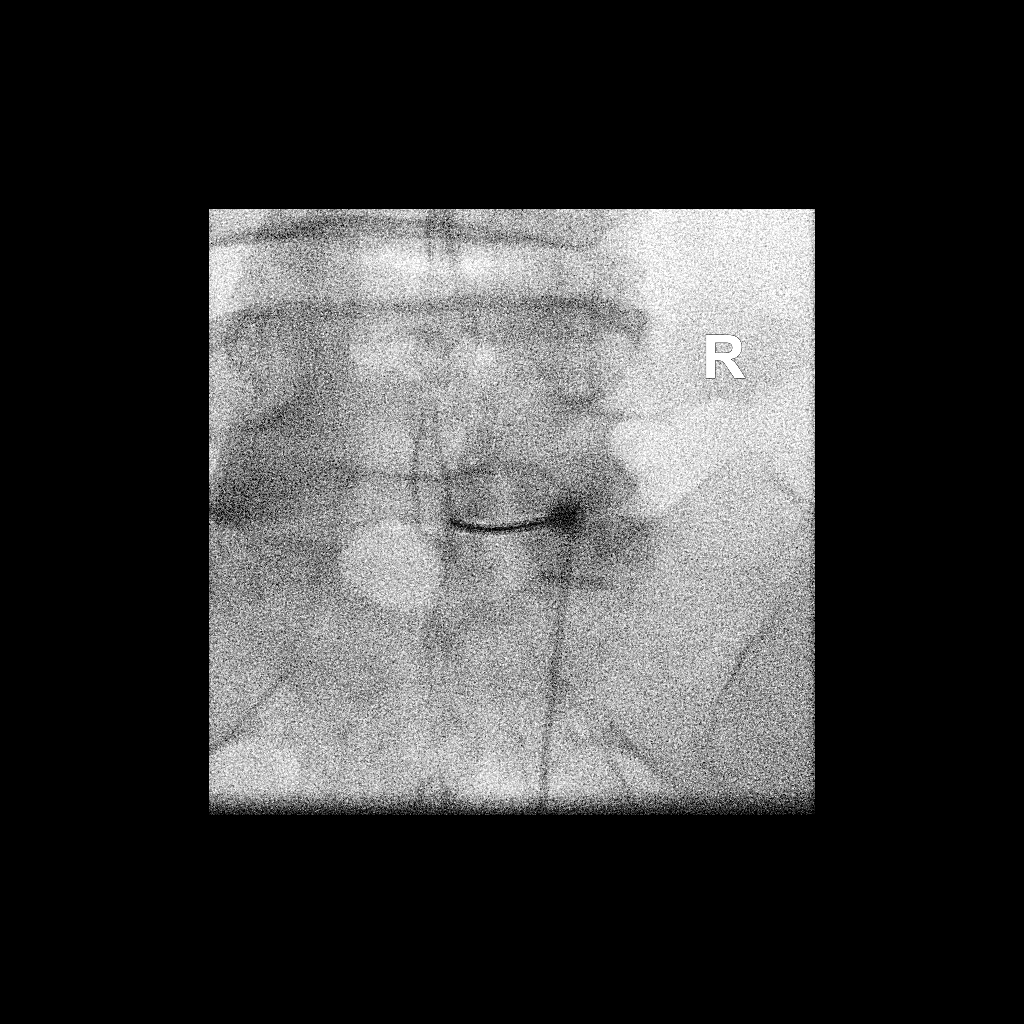

[2 of 2 positions shown; findings below may reference images not displayed]

FLUOROSCOPY TIME:  Fluoroscopy Time: 13 seconds

Radiation Exposure Index: 15.00 microGray*m^2

PROCEDURE:
The procedure, risks, benefits, and alternatives were explained to
the patient. Questions regarding the procedure were encouraged and
answered. The patient understands and consents to the procedure.

LUMBAR EPIDURAL INJECTION:

An interlaminar approach was performed on the right at L5-S1. The
overlying skin was cleansed and anesthetized. A 3.5 inch 20 gauge
epidural needle was advanced using loss-of-resistance technique.

DIAGNOSTIC EPIDURAL INJECTION:

Injection of Isovue-M 200 shows a good epidural pattern with spread
above and below the level of needle placement, primarily on the
right. No vascular opacification is seen.

THERAPEUTIC EPIDURAL INJECTION:

120 mg of Depo-Medrol mixed with 3 mL of 1% lidocaine were
instilled. The procedure was well-tolerated, and the patient was
discharged thirty minutes following the injection in good condition.

COMPLICATIONS:
None
IMPRESSION: Technically successful interlaminar epidural injection on the right
at L5-S1.

## 2022-03-21 ENCOUNTER — Other Ambulatory Visit: Payer: Self-pay | Admitting: Family

## 2022-03-21 DIAGNOSIS — I10 Essential (primary) hypertension: Secondary | ICD-10-CM

## 2022-03-21 NOTE — Telephone Encounter (Signed)
Courtesy need appt for further refills

## 2022-04-04 ENCOUNTER — Other Ambulatory Visit: Payer: Self-pay | Admitting: Family

## 2022-04-04 DIAGNOSIS — I10 Essential (primary) hypertension: Secondary | ICD-10-CM

## 2022-05-16 ENCOUNTER — Other Ambulatory Visit: Payer: Self-pay | Admitting: Family

## 2022-05-16 DIAGNOSIS — K219 Gastro-esophageal reflux disease without esophagitis: Secondary | ICD-10-CM

## 2022-05-16 DIAGNOSIS — M810 Age-related osteoporosis without current pathological fracture: Secondary | ICD-10-CM

## 2022-05-17 ENCOUNTER — Other Ambulatory Visit: Payer: Self-pay | Admitting: Orthopedic Surgery

## 2022-05-17 DIAGNOSIS — M25512 Pain in left shoulder: Secondary | ICD-10-CM

## 2022-05-31 ENCOUNTER — Other Ambulatory Visit: Payer: Medicare Other

## 2022-06-07 ENCOUNTER — Ambulatory Visit
Admission: RE | Admit: 2022-06-07 | Discharge: 2022-06-07 | Disposition: A | Discharge status: Home / Self Care | Payer: Medicare Other | Source: Ambulatory Visit | Attending: Orthopedic Surgery | Admitting: Orthopedic Surgery

## 2022-06-07 DIAGNOSIS — M25512 Pain in left shoulder: Secondary | ICD-10-CM

## 2022-09-11 ENCOUNTER — Other Ambulatory Visit: Payer: Self-pay | Admitting: Family

## 2022-09-11 DIAGNOSIS — K219 Gastro-esophageal reflux disease without esophagitis: Secondary | ICD-10-CM

## 2022-12-12 ENCOUNTER — Other Ambulatory Visit: Payer: Self-pay | Admitting: Family

## 2022-12-12 DIAGNOSIS — E782 Mixed hyperlipidemia: Secondary | ICD-10-CM

## 2023-11-14 ENCOUNTER — Other Ambulatory Visit: Payer: Self-pay

## 2023-11-14 DIAGNOSIS — D72829 Elevated white blood cell count, unspecified: Secondary | ICD-10-CM

## 2023-11-17 ENCOUNTER — Inpatient Hospital Stay

## 2023-11-17 ENCOUNTER — Inpatient Hospital Stay: Attending: Internal Medicine | Admitting: Internal Medicine

## 2023-11-17 VITALS — BP 131/67 | HR 68 | Temp 97.5°F | Resp 16 | Ht 60.0 in | Wt 159.0 lb

## 2023-11-17 DIAGNOSIS — I10 Essential (primary) hypertension: Secondary | ICD-10-CM

## 2023-11-17 DIAGNOSIS — I739 Peripheral vascular disease, unspecified: Secondary | ICD-10-CM | POA: Insufficient documentation

## 2023-11-17 DIAGNOSIS — D72829 Elevated white blood cell count, unspecified: Secondary | ICD-10-CM | POA: Insufficient documentation

## 2023-11-17 DIAGNOSIS — R7303 Prediabetes: Secondary | ICD-10-CM

## 2023-11-17 DIAGNOSIS — Z801 Family history of malignant neoplasm of trachea, bronchus and lung: Secondary | ICD-10-CM

## 2023-11-17 LAB — CBC WITH DIFFERENTIAL (CANCER CENTER ONLY)
Abs Immature Granulocytes: 0.1 K/uL — ABNORMAL HIGH (ref 0.00–0.07)
Basophils Absolute: 0 K/uL (ref 0.0–0.1)
Basophils Relative: 0 %
Eosinophils Absolute: 0.2 K/uL (ref 0.0–0.5)
Eosinophils Relative: 1 %
HCT: 37.8 % (ref 36.0–46.0)
Hemoglobin: 12.2 g/dL (ref 12.0–15.0)
Immature Granulocytes: 1 %
Lymphocytes Relative: 14 %
Lymphs Abs: 2.2 K/uL (ref 0.7–4.0)
MCH: 22.2 pg — ABNORMAL LOW (ref 26.0–34.0)
MCHC: 32.3 g/dL (ref 30.0–36.0)
MCV: 68.9 fL — ABNORMAL LOW (ref 80.0–100.0)
Monocytes Absolute: 1.1 K/uL — ABNORMAL HIGH (ref 0.1–1.0)
Monocytes Relative: 7 %
Neutro Abs: 11.6 K/uL — ABNORMAL HIGH (ref 1.7–7.7)
Neutrophils Relative %: 77 %
Platelet Count: 427 K/uL — ABNORMAL HIGH (ref 150–400)
RBC: 5.49 MIL/uL — ABNORMAL HIGH (ref 3.87–5.11)
RDW: 15.7 % — ABNORMAL HIGH (ref 11.5–15.5)
Smear Review: NORMAL
WBC Count: 15.2 K/uL — ABNORMAL HIGH (ref 4.0–10.5)
nRBC: 0 % (ref 0.0–0.2)

## 2023-11-17 LAB — CMP (CANCER CENTER ONLY)
ALT: 22 U/L (ref 0–44)
AST: 20 U/L (ref 15–41)
Albumin: 4.4 g/dL (ref 3.5–5.0)
Alkaline Phosphatase: 66 U/L (ref 38–126)
Anion gap: 8 (ref 5–15)
BUN: 8 mg/dL (ref 8–23)
CO2: 26 mmol/L (ref 22–32)
Calcium: 9.3 mg/dL (ref 8.9–10.3)
Chloride: 106 mmol/L (ref 98–111)
Creatinine: 0.68 mg/dL (ref 0.44–1.00)
GFR, Estimated: >60 mL/min (ref >60)
Glucose, Bld: 105 mg/dL — ABNORMAL HIGH (ref 70–99)
Potassium: 3.3 mmol/L — ABNORMAL LOW (ref 3.5–5.1)
Sodium: 140 mmol/L (ref 135–145)
Total Bilirubin: 0.4 mg/dL (ref 0.0–1.2)
Total Protein: 7.9 g/dL (ref 6.5–8.1)

## 2023-11-17 NOTE — Progress Notes (Signed)
 [facility] [phone]   [phone]  CONSULT NOTE  REFERRING PHYSICIAN: Roxan Plough, NP  REASON FOR CONSULTATION:  83 years old African-American female with persistent leukocytosis  HPI Crystal Preston is a 83 y.o. female.   HPI  Discussed the use of AI scribe software for clinical note transcription with the patient, who gave verbal consent to proceed.  History of Present Illness Crystal Preston is an 83 year old female who presents with persistent leukocytosis. She was referred by her primary care provider at [facility] for evaluation of elevated white blood cell count.  She has a history of persistent leukocytosis, with elevated white blood cell counts noted for at least six to seven years. Recent blood work showed a white blood cell count of 15.2, with previous counts of 13.5 in April 2023 and 12.3 in May 2023. Six years ago, her count was 16,000. She received a letter to see a hematologist but was not informed of the specific reason by her primary care provider.  Her current medications include ibuprofen , acetaminophen , Norvasc , and Avapro . She has received steroid injections in her knee and hip for arthritis, which occurred last year and recently. She has a history of dental procedures, including the extraction of all her teeth, for which she was prescribed Keflex  as a precaution against infection. No recent dental infections.  No chest pain, breathing issues, recent weight loss, night sweats, headaches, changes in vision, nausea, vomiting, or diarrhea. She feels 'kind of chilly all the time' and has a history of cataracts and watery eyes.  Her past medical history includes acid reflux, high blood pressure, high cholesterol, arthritis, peripheral vascular disease, and positional vertigo. She has borderline diabetes and received a steroid injection in her knee for arthritis last Wednesday.  Family history is significant for her father having  had throat cancer, her mother having had cervical cancer, and a sister with a blood cancer who receives treatment at the same facility.     Past Medical History:  Diagnosis Date   GERD (gastroesophageal reflux disease)    HTN (hypertension)    Hyperlipidemia    Insomnia, unspecified    Obesity, unspecified    Osteoarthritis    Other abnormality of red blood cells    Microcytosis   Peripheral vascular disease, unspecified (HCC)    Vertigo, benign positional    Head to left side      Past Surgical History:  Procedure Laterality Date   ABLATION ON ENDOMETRIOSIS  1980   CHOLECYSTECTOMY, LAPAROSCOPIC  1989   COLONOSCOPY  2008   COLONOSCOPY  2013   TOTAL ABDOMINAL HYSTERECTOMY  1971    Family History  Problem Relation Age of Onset   Cancer Father 23   Cancer Mother 68       cervical cancer   Glaucoma Brother     Social History Social History   Tobacco Use   Smoking status: Never   Smokeless tobacco: Never  Vaping Use   Vaping status: Never Used  Substance Use Topics   Alcohol use: No   Drug use: No    Allergies  Allergen Reactions   Naproxen    Nitrofuran Derivatives Other (See Comments)    Knots on body   Propoxyphene     Darvocet   Azithromycin Other (See Comments)    headache, excessive sleepiness    Current Outpatient Medications  Medication Sig Dispense Refill   Acetaminophen  Extra Strength 500 MG TABS TAKE 2 TABLETS (1,000 MG  TOTAL) BY MOUTH 2 (TWO) TIMES DAILY AS NEEDED. 60 tablet 11   amLODipine  (NORVASC ) 5 MG tablet TAKE 1 TABLET (5 MG TOTAL) BY MOUTH DAILY. 30 tablet 0   aspirin 81 MG tablet Take 81 mg by mouth daily.     CALCIUM CITRATE PO Take 1 tablet (1000 mg ) by mouth daily     cephALEXin  (KEFLEX ) 500 MG capsule Take 1 capsule (500 mg total) by mouth 4 (four) times daily. 28 capsule 0   Cholecalciferol 4000 units TABS Take 1 tablet by mouth daily     Cod Liver Oil CAPS Take 1 tablet by mouth daily     fluticasone  (FLONASE ) 50 MCG/ACT  nasal spray SPRAY 2 SPRAYS INTO EACH NOSTRIL EVERY DAY 48 mL 3   ibandronate  (BONIVA ) 150 MG tablet TAKE 1 EVERY 30 DAYS IN THE AM WITH FULL GLASS OF WATER ON EMPTY STOMACH. DO NOT LIE DOWN X30 MINUTE 3 tablet 12   irbesartan  (AVAPRO ) 150 MG tablet TAKE 1 TABLET BY MOUTH TWICE A DAY 90 tablet 2   Naphazoline HCl (CLEAR EYES OP) Apply to eye as needed.      pantoprazole  (PROTONIX ) 40 MG tablet APPOINTMENT OVERDUE take 1 tablet by mouth every day 7 tablet 0   pravastatin  (PRAVACHOL ) 40 MG tablet TAKE 1 TABLET BY MOUTH EVERY DAY 90 tablet 1   saccharomyces boulardii (FLORASTOR) 250 MG capsule Take 1 capsule (250 mg total) by mouth 2 (two) times daily. Take along with Cipro  500mg  to prevent any diarrhea associated with antibiotic. 20 capsule 0   No current facility-administered medications for this visit.    Review of Systems  Constitutional: negative Eyes: negative Ears, nose, mouth, throat, and face: negative Respiratory: negative Cardiovascular: negative Gastrointestinal: negative Genitourinary:negative Integument/breast: negative Hematologic/lymphatic: negative Musculoskeletal:positive for arthralgias Neurological: negative Behavioral/Psych: negative Endocrine: negative Allergic/Immunologic: negative  Physical Exam  MJO:jozmu, healthy, no distress, well nourished, and well developed SKIN: skin color, texture, turgor are normal, no rashes or significant lesions HEAD: Normocephalic, No masses, lesions, tenderness or abnormalities EYES: normal, PERRLA, Conjunctiva are pink and non-injected EARS: External ears normal, Canals clear OROPHARYNX:no exudate, no erythema, and lips, buccal mucosa, and tongue normal  NECK: supple, no adenopathy, no JVD LYMPH:  no palpable lymphadenopathy, no hepatosplenomegaly BREAST:not examined LUNGS: clear to auscultation , and palpation HEART: regular rate & rhythm, no murmurs, and no gallops ABDOMEN:abdomen soft, non-tender, normal bowel sounds, and  no masses or organomegaly BACK: Back symmetric, no curvature., No CVA tenderness EXTREMITIES:no joint deformities, effusion, or inflammation, no edema  NEURO: alert & oriented x 3 with fluent speech, no focal motor/sensory deficits  PERFORMANCE STATUS: ECOG 1  LABORATORY DATA: Lab Results  Component Value Date   WBC 15.2 (H) 11/17/2023   HGB 12.2 11/17/2023   HCT 37.8 11/17/2023   MCV 68.9 (L) 11/17/2023   PLT 427 (H) 11/17/2023      Chemistry      Component Value Date/Time   NA 140 11/17/2023 1035   K 3.3 (L) 11/17/2023 1035   CL 106 11/17/2023 1035   CO2 26 11/17/2023 1035   BUN 8 11/17/2023 1035   CREATININE 0.68 11/17/2023 1035   CREATININE 0.81 07/31/2021 1345      Component Value Date/Time   CALCIUM 9.3 11/17/2023 1035   ALKPHOS 66 11/17/2023 1035   AST 20 11/17/2023 1035   ALT 22 11/17/2023 1035   BILITOT 0.4 11/17/2023 1035       RADIOGRAPHIC STUDIES: No results found.  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 Differential diagnosis includes chronic inflammation, possible bone marrow disorder, or effects from recent steroid injections. No acute symptoms suggestive of leukemia. - Order BCR/ABL test to rule out chronic myeloid leukemia and JAK 2 mutation panel.- Repeat blood work in one month to reassess white blood cell count.  Osteoarthritis Osteoarthritis with recent steroid injections to the knee and hip. Steroid injections may contribute to elevated white blood cell count.  Hypertension Hypertension managed with Norvasc  and Avapro . No acute issues reported.  Peripheral Vascular Disease Peripheral vascular disease with no acute symptoms reported during the visit.  Acid Reflux Acid reflux with no acute symptoms reported during the visit.  Positional Vertigo Positional vertigo with no acute symptoms  reported during the visit.  Cataract Diagnosed with cataract by Dr. Robinson. No acute visual changes reported.     The patient voices understanding of current disease status and treatment options and is in agreement with the current care plan.  All questions were answered. The patient knows to call the clinic with any problems, questions or concerns. We can certainly see the patient much sooner if necessary.  Thank you so much for allowing me to participate in the care of Crystal Preston. I will continue to follow up the patient with you and assist in her care.  The total time spent in the appointment was 60 minutes including review of chart and various tests results, discussions about plan of care and coordination of care plan .   Disclaimer: This note was dictated with voice recognition software. Similar sounding words can inadvertently be transcribed and may not be corrected upon review.   Sherrod Crystal Preston Sherrod November 17, 2023, 12:09 PM

## 2023-11-18 ENCOUNTER — Telehealth: Payer: Self-pay | Admitting: Internal Medicine

## 2023-11-18 LAB — SURGICAL PATHOLOGY

## 2023-11-18 NOTE — Telephone Encounter (Signed)
Scheduled appointments with the patient

## 2023-11-19 LAB — FLOW CYTOMETRY

## 2023-11-20 LAB — BCR-ABL1 FISH
Cells Analyzed: 200
Cells Counted: 200

## 2023-11-25 LAB — NGS JAK2 E12-15/CALR/MPL

## 2023-12-17 ENCOUNTER — Inpatient Hospital Stay: Admitting: Internal Medicine

## 2023-12-17 ENCOUNTER — Inpatient Hospital Stay: Attending: Internal Medicine

## 2023-12-17 VITALS — BP 136/79 | HR 73 | Temp 97.2°F | Resp 17 | Wt 159.2 lb

## 2023-12-17 DIAGNOSIS — D72829 Elevated white blood cell count, unspecified: Secondary | ICD-10-CM | POA: Insufficient documentation

## 2023-12-17 LAB — CBC WITH DIFFERENTIAL (CANCER CENTER ONLY)
Abs Immature Granulocytes: 0.06 K/uL (ref 0.00–0.07)
Basophils Absolute: 0 K/uL (ref 0.0–0.1)
Basophils Relative: 0 %
Eosinophils Absolute: 0.2 K/uL (ref 0.0–0.5)
Eosinophils Relative: 2 %
HCT: 37 % (ref 36.0–46.0)
Hemoglobin: 11.8 g/dL — ABNORMAL LOW (ref 12.0–15.0)
Immature Granulocytes: 1 %
Lymphocytes Relative: 15 %
Lymphs Abs: 1.8 K/uL (ref 0.7–4.0)
MCH: 22.2 pg — ABNORMAL LOW (ref 26.0–34.0)
MCHC: 31.9 g/dL (ref 30.0–36.0)
MCV: 69.7 fL — ABNORMAL LOW (ref 80.0–100.0)
Monocytes Absolute: 0.8 K/uL (ref 0.1–1.0)
Monocytes Relative: 7 %
Neutro Abs: 8.7 K/uL — ABNORMAL HIGH (ref 1.7–7.7)
Neutrophils Relative %: 75 %
Platelet Count: 418 K/uL — ABNORMAL HIGH (ref 150–400)
RBC: 5.31 MIL/uL — ABNORMAL HIGH (ref 3.87–5.11)
RDW: 16.1 % — ABNORMAL HIGH (ref 11.5–15.5)
WBC Count: 11.6 K/uL — ABNORMAL HIGH (ref 4.0–10.5)
nRBC: 0 % (ref 0.0–0.2)

## 2023-12-17 LAB — LACTATE DEHYDROGENASE: LDH: 208 U/L — ABNORMAL HIGH (ref 98–192)

## 2023-12-17 NOTE — Progress Notes (Signed)
 [facility] [phone]   [phone]  OFFICE PROGRESS NOTE  [name], [name], NP [address] 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  Likely reactive leukocytosis.  The patient has negative FISH study for BCR/ABL as well as JAK2 mutation panel.  Flow cytometry was also negative for any lymphoproliferative disorder.  PRIOR THERAPY: None  CURRENT THERAPY: Observation  INTERVAL HISTORY: Crystal Preston 83 y.o. female returns to the clinic today for follow-up visit. Discussed the use of AI scribe software for clinical note transcription with the patient, who gave verbal consent to proceed.  History of Present Illness Crystal Preston is an 83 year old female with chronic leukocytosis who presents for evaluation and repeat CBC and LDH.  She has a history of chronic leukocytosis for several years. Previous workup included a negative FISH study for PCR/ABL, a negative JAK2 mutation panel, and negative flow cytometry for any myeloproliferative disorder. Her white blood cell count was previously elevated at 15.2 on July 28, but has decreased to 11.6 today. She uses a nasal spray containing steroids, which may contribute to elevated counts.  She feels great with no current complaints. No chest pain, shortness of breath, cough, nausea, vomiting, diarrhea, fever, chills, or recent infections.  She has mild anemia. She has iron at home but had stopped taking it. She maintains an active lifestyle, regularly going to the gym for over fifteen to twenty years, and enjoys walking in the park.     MEDICAL HISTORY: Past Medical History:  Diagnosis Date   GERD (gastroesophageal reflux disease)    HTN (hypertension)    Hyperlipidemia    Insomnia, unspecified    Obesity, unspecified    Osteoarthritis    Other abnormality of red blood cells    Microcytosis   Peripheral  vascular disease, unspecified (HCC)    Vertigo, benign positional    Head to left side    ALLERGIES:  is allergic to naproxen, nitrofuran derivatives, propoxyphene, and azithromycin.  MEDICATIONS:  Current Outpatient Medications  Medication Sig Dispense Refill   Acetaminophen  Extra Strength 500 MG TABS TAKE 2 TABLETS (1,000 MG TOTAL) BY MOUTH 2 (TWO) TIMES DAILY AS NEEDED. 60 tablet 11   amLODipine  (NORVASC ) 5 MG tablet TAKE 1 TABLET (5 MG TOTAL) BY MOUTH DAILY. 30 tablet 0   aspirin 81 MG tablet Take 81 mg by mouth daily.     CALCIUM CITRATE PO Take 1 tablet (1000 mg ) by mouth daily     cephALEXin  (KEFLEX ) 500 MG capsule Take 1 capsule (500 mg total) by mouth 4 (four) times daily. 28 capsule 0   Cholecalciferol 4000 units TABS Take 1 tablet by mouth daily     Cod Liver Oil CAPS Take 1 tablet by mouth daily     fluticasone  (FLONASE ) 50 MCG/ACT nasal spray SPRAY 2 SPRAYS INTO EACH NOSTRIL EVERY DAY 48 mL 3   ibandronate  (BONIVA ) 150 MG tablet TAKE 1 EVERY 30 DAYS IN THE AM WITH FULL GLASS OF WATER ON EMPTY STOMACH. DO NOT LIE DOWN X30 MINUTE 3 tablet 12   irbesartan  (AVAPRO ) 150 MG tablet TAKE 1 TABLET BY MOUTH TWICE A DAY 90 tablet 2   Naphazoline HCl (CLEAR EYES OP) Apply to eye as needed.      pantoprazole  (PROTONIX ) 40 MG tablet APPOINTMENT OVERDUE take 1 tablet by mouth every day 7 tablet 0   pravastatin  (  PRAVACHOL ) 40 MG tablet TAKE 1 TABLET BY MOUTH EVERY DAY 90 tablet 1   saccharomyces boulardii (FLORASTOR) 250 MG capsule Take 1 capsule (250 mg total) by mouth 2 (two) times daily. Take along with Cipro  500mg  to prevent any diarrhea associated with antibiotic. 20 capsule 0   No current facility-administered medications for this visit.    SURGICAL HISTORY:  Past Surgical History:  Procedure Laterality Date   ABLATION ON ENDOMETRIOSIS  1980   CHOLECYSTECTOMY, LAPAROSCOPIC  1989   COLONOSCOPY  2008   COLONOSCOPY  2013   TOTAL ABDOMINAL HYSTERECTOMY  1971    REVIEW OF  SYSTEMS:  A comprehensive review of systems was negative except for: Constitutional: positive for fatigue   PHYSICAL EXAMINATION: General appearance: alert, appears stated age, fatigued, and no distress Head: Normocephalic, without obvious abnormality, atraumatic Neck: no adenopathy, no JVD, supple, symmetrical, trachea midline, and thyroid  not enlarged, symmetric, no tenderness/mass/nodules Lymph nodes: Cervical, supraclavicular, and axillary nodes normal. Resp: clear to auscultation bilaterally Back: symmetric, no curvature. ROM normal. No CVA tenderness. Cardio: regular rate and rhythm, S1, S2 normal, no murmur, click, rub or gallop GI: soft, non-tender; bowel sounds normal; no masses,  no organomegaly Extremities: extremities normal, atraumatic, no cyanosis or edema  ECOG PERFORMANCE STATUS: 1 - Symptomatic but completely ambulatory  Blood pressure 136/79, pulse 73, temperature (!) 97.2 F (36.2 C), resp. rate 17, weight 159 lb 3.2 oz (72.2 kg), SpO2 98%.  LABORATORY DATA: Lab Results  Component Value Date   WBC 15.2 (H) 11/17/2023   HGB 12.2 11/17/2023   HCT 37.8 11/17/2023   MCV 68.9 (L) 11/17/2023   PLT 427 (H) 11/17/2023      Chemistry      Component Value Date/Time   NA 140 11/17/2023 1035   K 3.3 (L) 11/17/2023 1035   CL 106 11/17/2023 1035   CO2 26 11/17/2023 1035   BUN 8 11/17/2023 1035   CREATININE 0.68 11/17/2023 1035   CREATININE 0.81 07/31/2021 1345      Component Value Date/Time   CALCIUM 9.3 11/17/2023 1035   ALKPHOS 66 11/17/2023 1035   AST 20 11/17/2023 1035   ALT 22 11/17/2023 1035   BILITOT 0.4 11/17/2023 1035       RADIOGRAPHIC STUDIES: No results found.  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  Likely reactive leukocytosis.  The patient has negative FISH study for BCR/ABL as well as JAK2 mutation panel.   Flow cytometry was also negative for any lymphoproliferative disorder.  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 Negative for leukemia, lymphoma, or myeloproliferative disorders based on previous studies including negative FISH for BCR/ABL, negative JAK2 mutation panel, and negative flow cytometry. Recent CBC shows improvement with white blood cell count decreasing from 15.2 to 11.6, approaching normal range. Nasal spray containing steroids may contribute to elevated counts. - Repeat CBC and LDH  Anemia Mild anemia identified. Recommended iron supplementation as she is not currently taking iron. Advised to take iron with orange juice to enhance absorption. - Advise to take over-the-counter iron supplements every other day with orange juice She was advised to call immediately if she has any concerning symptoms in the interval. The patient voices understanding of current disease status and treatment options and is in agreement with the current care plan.  All questions were answered. The  patient knows to call the clinic with any problems, questions or concerns. We can certainly see the patient much sooner if necessary.  The total time spent in the appointment was 20 minutes including review of chart and various tests results, discussions about plan of care and coordination of care plan .   Disclaimer: This note was dictated with voice recognition software. Similar sounding words can inadvertently be transcribed and may not be corrected upon review.

## 2024-04-19 ENCOUNTER — Emergency Department (HOSPITAL_COMMUNITY)
Admission: EM | Admit: 2024-04-19 | Discharge: 2024-04-20 | Attending: Emergency Medicine | Admitting: Emergency Medicine

## 2024-04-19 ENCOUNTER — Encounter (HOSPITAL_COMMUNITY): Payer: Self-pay

## 2024-04-19 ENCOUNTER — Emergency Department (HOSPITAL_COMMUNITY)

## 2024-04-19 ENCOUNTER — Other Ambulatory Visit: Payer: Self-pay

## 2024-04-19 DIAGNOSIS — R531 Weakness: Secondary | ICD-10-CM | POA: Insufficient documentation

## 2024-04-19 DIAGNOSIS — R112 Nausea with vomiting, unspecified: Secondary | ICD-10-CM | POA: Insufficient documentation

## 2024-04-19 DIAGNOSIS — Z5321 Procedure and treatment not carried out due to patient leaving prior to being seen by health care provider: Secondary | ICD-10-CM | POA: Insufficient documentation

## 2024-04-19 DIAGNOSIS — R42 Dizziness and giddiness: Secondary | ICD-10-CM | POA: Diagnosis present

## 2024-04-19 LAB — COMPREHENSIVE METABOLIC PANEL WITH GFR
ALT: 17 U/L (ref 0–44)
AST: 24 U/L (ref 15–41)
Albumin: 4.6 g/dL (ref 3.5–5.0)
Alkaline Phosphatase: 74 U/L (ref 38–126)
Anion gap: 13 (ref 5–15)
BUN: 8 mg/dL (ref 8–23)
CO2: 25 mmol/L (ref 22–32)
Calcium: 9.3 mg/dL (ref 8.9–10.3)
Chloride: 102 mmol/L (ref 98–111)
Creatinine, Ser: 0.56 mg/dL (ref 0.44–1.00)
GFR, Estimated: >60 mL/min
Glucose, Bld: 95 mg/dL (ref 70–99)
Potassium: 3.3 mmol/L — ABNORMAL LOW (ref 3.5–5.1)
Sodium: 140 mmol/L (ref 135–145)
Total Bilirubin: 0.3 mg/dL (ref 0.0–1.2)
Total Protein: 7.7 g/dL (ref 6.5–8.1)

## 2024-04-19 LAB — CBC
HCT: 39 % (ref 36.0–46.0)
Hemoglobin: 12.1 g/dL (ref 12.0–15.0)
MCH: 22.2 pg — ABNORMAL LOW (ref 26.0–34.0)
MCHC: 31 g/dL (ref 30.0–36.0)
MCV: 71.7 fL — ABNORMAL LOW (ref 80.0–100.0)
Platelets: 422 K/uL — ABNORMAL HIGH (ref 150–400)
RBC: 5.44 MIL/uL — ABNORMAL HIGH (ref 3.87–5.11)
RDW: 16.6 % — ABNORMAL HIGH (ref 11.5–15.5)
WBC: 12.6 K/uL — ABNORMAL HIGH (ref 4.0–10.5)
nRBC: 0 % (ref 0.0–0.2)

## 2024-04-19 NOTE — ED Triage Notes (Signed)
 Pt states the dizziness started upon waking at 1100 today. Pt's LKW today at 0000

## 2024-04-19 NOTE — ED Triage Notes (Signed)
 Patient states that she was dizzy and weak this morning all throughout the day and then tonight she's had 2 vomiting episodes.  Denies abdominal pain, fever or chills.

## 2024-04-19 NOTE — ED Provider Triage Note (Signed)
 Emergency Medicine Provider Triage Evaluation Note  Crystal Preston , a 83 y.o. female  was evaluated in triage.  Pt complains of dizzy. Report feeling like she's going to faint this morning when she got up, and it progress around noon.  Felt unsteady on feet, having to use her cane.  Did report a few vomiting episode last night.  No headache, cold sxs, but did mentioned her ears feel clogged up.  No urinary sxs, no focal weakness but did report occasional tingling sensation down L arm for the past several days  Review of Systems  Positive: As above Negative: As above  Physical Exam  BP 131/73   Pulse 79   Temp 98.1 F (36.7 C)   Resp 17   SpO2 99%  Gen:   Awake, no distress   Resp:  Normal effort  MSK:   Moves extremities without difficulty  Other:  Neurologic exam:  Speech clear, pupils equal round reactive to light, extraocular movements intact Normal peripheral visual fields Cranial nerves III through XII normal including no facial droop Follows commands, moves all extremities x4, normal strength to bilateral upper and lower extremities at all major muscle groups including grip Sensation normal to light touch  Coordination intact, no limb ataxia, finger-nose-finger normal Rapid alternating movements normal No pronator drift Gait steady with her cane.     Medical Decision Making  Medically screening exam initiated at 6:56 PM.  Appropriate orders placed.  Christyana M Horger was informed that the remainder of the evaluation will be completed by another provider, this initial triage assessment does not replace that evaluation, and the importance of remaining in the ED until their evaluation is complete.  No focal neuro deficit.     Nivia Colon, PA-C 04/19/24 1900

## 2024-04-20 MED ORDER — ACETAMINOPHEN 325 MG PO TABS
650.0000 mg | ORAL_TABLET | Freq: Once | ORAL | Status: AC
  Administered 2024-04-20: 650 mg via ORAL
  Filled 2024-04-20: qty 2

## 2024-04-20 NOTE — ED Notes (Signed)
Pt stated that she was leaving  

## 2024-04-21 ENCOUNTER — Other Ambulatory Visit: Payer: Self-pay

## 2024-04-21 ENCOUNTER — Emergency Department (HOSPITAL_BASED_OUTPATIENT_CLINIC_OR_DEPARTMENT_OTHER)

## 2024-04-21 ENCOUNTER — Emergency Department (HOSPITAL_BASED_OUTPATIENT_CLINIC_OR_DEPARTMENT_OTHER)
Admission: EM | Admit: 2024-04-21 | Discharge: 2024-04-21 | Disposition: A | Discharge status: Home / Self Care | Attending: Emergency Medicine | Admitting: Emergency Medicine

## 2024-04-21 ENCOUNTER — Encounter (HOSPITAL_BASED_OUTPATIENT_CLINIC_OR_DEPARTMENT_OTHER): Payer: Self-pay

## 2024-04-21 DIAGNOSIS — I10 Essential (primary) hypertension: Secondary | ICD-10-CM | POA: Diagnosis not present

## 2024-04-21 DIAGNOSIS — R42 Dizziness and giddiness: Secondary | ICD-10-CM | POA: Diagnosis present

## 2024-04-21 DIAGNOSIS — E876 Hypokalemia: Secondary | ICD-10-CM | POA: Insufficient documentation

## 2024-04-21 DIAGNOSIS — N3000 Acute cystitis without hematuria: Secondary | ICD-10-CM | POA: Insufficient documentation

## 2024-04-21 DIAGNOSIS — Z7982 Long term (current) use of aspirin: Secondary | ICD-10-CM | POA: Diagnosis not present

## 2024-04-21 DIAGNOSIS — E860 Dehydration: Secondary | ICD-10-CM | POA: Insufficient documentation

## 2024-04-21 DIAGNOSIS — Z79899 Other long term (current) drug therapy: Secondary | ICD-10-CM | POA: Insufficient documentation

## 2024-04-21 LAB — COMPREHENSIVE METABOLIC PANEL WITH GFR
ALT: 17 U/L (ref 0–44)
AST: 27 U/L (ref 15–41)
Albumin: 4.2 g/dL (ref 3.5–5.0)
Alkaline Phosphatase: 68 U/L (ref 38–126)
Anion gap: 12 (ref 5–15)
BUN: 9 mg/dL (ref 8–23)
CO2: 27 mmol/L (ref 22–32)
Calcium: 9.1 mg/dL (ref 8.9–10.3)
Chloride: 103 mmol/L (ref 98–111)
Creatinine, Ser: 0.53 mg/dL (ref 0.44–1.00)
GFR, Estimated: >60 mL/min
Glucose, Bld: 98 mg/dL (ref 70–99)
Potassium: 3.1 mmol/L — ABNORMAL LOW (ref 3.5–5.1)
Sodium: 142 mmol/L (ref 135–145)
Total Bilirubin: 0.4 mg/dL (ref 0.0–1.2)
Total Protein: 6.8 g/dL (ref 6.5–8.1)

## 2024-04-21 LAB — CBC
HCT: 39.5 % (ref 36.0–46.0)
Hemoglobin: 12.3 g/dL (ref 12.0–15.0)
MCH: 22 pg — ABNORMAL LOW (ref 26.0–34.0)
MCHC: 31.1 g/dL (ref 30.0–36.0)
MCV: 70.5 fL — ABNORMAL LOW (ref 80.0–100.0)
Platelets: 408 K/uL — ABNORMAL HIGH (ref 150–400)
RBC: 5.6 MIL/uL — ABNORMAL HIGH (ref 3.87–5.11)
RDW: 16.5 % — ABNORMAL HIGH (ref 11.5–15.5)
WBC: 10.2 K/uL (ref 4.0–10.5)
nRBC: 0 % (ref 0.0–0.2)

## 2024-04-21 LAB — TROPONIN T, HIGH SENSITIVITY: Troponin T High Sensitivity: <15 ng/L (ref 0–19)

## 2024-04-21 LAB — URINALYSIS, ROUTINE W REFLEX MICROSCOPIC
Bilirubin Urine: NEGATIVE
Glucose, UA: NEGATIVE mg/dL
Hgb urine dipstick: NEGATIVE
Ketones, ur: 15 mg/dL — AB
Nitrite: POSITIVE — AB
Protein, ur: NEGATIVE mg/dL
Specific Gravity, Urine: 1.015 (ref 1.005–1.030)
pH: 7 (ref 5.0–8.0)

## 2024-04-21 LAB — RESP PANEL BY RT-PCR (RSV, FLU A&B, COVID)  RVPGX2
Influenza A by PCR: NEGATIVE
Influenza B by PCR: NEGATIVE
Resp Syncytial Virus by PCR: NEGATIVE
SARS Coronavirus 2 by RT PCR: NEGATIVE

## 2024-04-21 LAB — URINALYSIS, MICROSCOPIC (REFLEX)

## 2024-04-21 MED ORDER — CEPHALEXIN 250 MG PO CAPS
500.0000 mg | ORAL_CAPSULE | Freq: Once | ORAL | Status: AC
  Administered 2024-04-21: 500 mg via ORAL
  Filled 2024-04-21: qty 2

## 2024-04-21 MED ORDER — LACTATED RINGERS IV BOLUS
500.0000 mL | Freq: Once | INTRAVENOUS | Status: AC
  Administered 2024-04-21: 500 mL via INTRAVENOUS

## 2024-04-21 MED ORDER — POTASSIUM CHLORIDE CRYS ER 20 MEQ PO TBCR
40.0000 meq | EXTENDED_RELEASE_TABLET | Freq: Once | ORAL | Status: AC
  Administered 2024-04-21: 40 meq via ORAL
  Filled 2024-04-21: qty 2

## 2024-04-21 MED ORDER — ONDANSETRON HCL 4 MG/2ML IJ SOLN
4.0000 mg | Freq: Once | INTRAMUSCULAR | Status: AC
  Administered 2024-04-21: 4 mg via INTRAVENOUS
  Filled 2024-04-21: qty 2

## 2024-04-21 MED ORDER — CEPHALEXIN 500 MG PO CAPS: 500.0000 mg | ORAL_CAPSULE | Freq: Four times a day (QID) | ORAL | 0 refills | Status: AC

## 2024-04-21 MED ORDER — ONDANSETRON 4 MG PO TBDP: 4.0000 mg | ORAL_TABLET | Freq: Three times a day (TID) | ORAL | 0 refills | Status: AC | PRN

## 2024-04-21 MED ORDER — MECLIZINE HCL 25 MG PO TABS
25.0000 mg | ORAL_TABLET | Freq: Once | ORAL | Status: AC
  Administered 2024-04-21: 25 mg via ORAL
  Filled 2024-04-21: qty 1

## 2024-04-21 MED ORDER — ACETAMINOPHEN 500 MG PO TABS
1000.0000 mg | ORAL_TABLET | Freq: Once | ORAL | Status: AC
  Administered 2024-04-21: 1000 mg via ORAL
  Filled 2024-04-21: qty 2

## 2024-04-21 MED ORDER — MECLIZINE HCL 25 MG PO TABS: 25.0000 mg | ORAL_TABLET | Freq: Three times a day (TID) | ORAL | 0 refills | Status: AC | PRN

## 2024-04-21 NOTE — ED Provider Notes (Signed)
 " [facility] Provider Note   CSN: [id number] Arrival date & time: 04/21/24  1234     Patient presents with: Dizziness   Crystal Preston is a 83 y.o. female.    Dizziness  Patient is an 83 year old female with a past medical history significant for HTN, reflux, BPPV, peripheral vascular disease  Patient presents emergency room today with complaint of some dizziness, fatigue, vomiting since 12/29 she actually came to the emergency room that day but left without being seen.  She denies any chest pain or difficulty breathing headaches fevers or shortness of breath.  She indicates that yesterday she had a moment of chest pain that resolved outside without any medication.  She denies any other symptoms.     Prior to Admission medications  Medication Sig Start Date End Date Taking? Authorizing Provider  amLODipine  (NORVASC ) 10 MG tablet Take 10 mg by mouth daily. 04/19/24  Yes [provider]  cephALEXin  (KEFLEX ) 500 MG capsule Take 1 capsule (500 mg total) by mouth 4 (four) times daily. 04/21/24  Yes Ayvah Caroll S, PA  ibuprofen  (ADVIL ) 800 MG tablet Take 800 mg by mouth daily as needed. 01/26/24  Yes [provider]  meclizine (ANTIVERT) 25 MG tablet Take 1 tablet (25 mg total) by mouth 3 (three) times daily as needed for dizziness. 04/21/24  Yes Keyante Durio S, PA  metFORMIN (GLUCOPHAGE-XR) 500 MG 24 hr tablet Take 500 mg by mouth every morning. 01/31/24  Yes [provider]  ondansetron (ZOFRAN-ODT) 4 MG disintegrating tablet Take 1 tablet (4 mg total) by mouth every 8 (eight) hours as needed for nausea or vomiting. 04/21/24  Yes Hilja Kintzel S, PA  Acetaminophen  Extra Strength 500 MG TABS TAKE 2 TABLETS (1,000 MG TOTAL) BY MOUTH 2 (TWO) TIMES DAILY AS NEEDED. 12/14/21   [name], Dinah C, NP  amLODipine  (NORVASC ) 5 MG tablet TAKE 1 TABLET (5 MG TOTAL) BY MOUTH DAILY. 03/21/22   [name], Dinah C, NP  aspirin  81 MG tablet Take 81 mg by mouth daily.    [provider]  CALCIUM CITRATE PO Take 1 tablet (1000 mg ) by mouth daily    [provider]  Cholecalciferol 4000 units TABS Take 1 tablet by mouth daily    [provider]  Cod Liver Oil CAPS Take 1 tablet by mouth daily    [provider]  fluticasone  (FLONASE ) 50 MCG/ACT nasal spray SPRAY 2 SPRAYS INTO EACH NOSTRIL EVERY DAY 12/14/21   [name], Dinah C, NP  ibandronate  (BONIVA ) 150 MG tablet TAKE 1 EVERY 30 DAYS IN THE AM WITH FULL GLASS OF WATER ON EMPTY STOMACH. DO NOT LIE DOWN X30 MINUTE 05/16/22   [name], Dinah C, NP  irbesartan  (AVAPRO ) 150 MG tablet TAKE 1 TABLET BY MOUTH TWICE A DAY 09/10/21   [name], Dinah C, NP  Naphazoline HCl (CLEAR EYES OP) Apply to eye as needed.     [provider]  pantoprazole  (PROTONIX ) 40 MG tablet APPOINTMENT OVERDUE take 1 tablet by mouth every day 09/11/22   [name], Dinah C, NP  pravastatin  (PRAVACHOL ) 40 MG tablet TAKE 1 TABLET BY MOUTH EVERY DAY 12/12/22   [name], Dinah C, NP  saccharomyces boulardii (FLORASTOR) 250 MG capsule Take 1 capsule (250 mg total) by mouth 2 (two) times daily. Take along with Cipro  500mg  to prevent any diarrhea associated with antibiotic. 08/03/21   [name], Dinah C, NP    Allergies: Naproxen, Nitrofuran derivatives, Propoxyphene, and  Azithromycin    Review of Systems  Neurological:  Positive for dizziness.    Updated Vital Signs BP 122/73   Pulse 60   Temp 98.9 F (37.2 C) (Oral)   Resp 15   SpO2 98%   Physical Exam Vitals and nursing note reviewed.  Constitutional:      General: She is not in acute distress. HENT:     Head: Normocephalic and atraumatic.     Nose: Nose normal.     Mouth/Throat:     Mouth: Mucous membranes are dry.  Eyes:     General: No scleral icterus. Cardiovascular:     Rate and Rhythm: Normal rate and regular rhythm.     Pulses: Normal pulses.     Heart sounds: Normal heart sounds.  Pulmonary:      Effort: Pulmonary effort is normal. No respiratory distress.     Breath sounds: No wheezing.  Abdominal:     Palpations: Abdomen is soft.     Tenderness: There is no abdominal tenderness. There is no guarding or rebound.  Musculoskeletal:     Cervical back: Normal range of motion.     Right lower leg: No edema.     Left lower leg: No edema.  Skin:    General: Skin is warm and dry.     Capillary Refill: Capillary refill takes less than 2 seconds.  Neurological:     Mental Status: She is alert. Mental status is at baseline.     Comments: Alert and oriented to self, place, time and event.   Speech is fluent, clear without dysarthria or dysphasia.   Strength 5/5 in upper/lower extremities   Sensation intact in upper/lower extremities   CN I not tested  CN II grossly intact visual fields bilaterally. Did not visualize posterior eye.  CN III, IV, VI PERRLA and EOMs intact bilaterally  CN V Intact sensation to sharp and light touch to the face  CN VII facial movements symmetric  CN VIII not tested  CN IX, X no uvula deviation, symmetric rise of soft palate  CN XI 5/5 SCM and trapezius strength bilaterally  CN XII Midline tongue protrusion, symmetric L/R movements   Psychiatric:        Mood and Affect: Mood normal.        Behavior: Behavior normal.     (all labs ordered are listed, but only abnormal results are displayed) Labs Reviewed  CBC - Abnormal; Notable for the following components:      Result Value   RBC 5.60 (*)    MCV 70.5 (*)    MCH 22.0 (*)    RDW 16.5 (*)    Platelets 408 (*)    All other components within normal limits  URINALYSIS, ROUTINE W REFLEX MICROSCOPIC - Abnormal; Notable for the following components:   APPearance CLOUDY (*)    Ketones, ur 15 (*)    Nitrite POSITIVE (*)    Leukocytes,Ua SMALL (*)    All other components within normal limits  COMPREHENSIVE METABOLIC PANEL WITH GFR - Abnormal; Notable for the following components:   Potassium 3.1 (*)     All other components within normal limits  URINALYSIS, MICROSCOPIC (REFLEX) - Abnormal; Notable for the following components:   Bacteria, UA MANY (*)    All other components within normal limits  RESP PANEL BY RT-PCR (RSV, FLU A&B, COVID)  RVPGX2  URINE CULTURE  TROPONIN T, HIGH SENSITIVITY    EKG: EKG Interpretation Date/Time:  Wednesday April 21 2024  12:50:27 EST Ventricular Rate:  70 PR Interval:  165 QRS Duration:  94 QT Interval:  465 QTC Calculation: 502 R Axis:   -35  Text Interpretation: Sinus rhythm Left axis deviation Probable anterior infarct, age indeterminate new Prolonged QT interval Artifact in lead(s) I II III aVR aVL aVF Confirmed by Doretha Folks ([id number]) on 04/21/2024 1:29:23 PM  Radiology: [facility] Chest 2 View Result Date: 04/21/2024 EXAM: 2 VIEW(S) XRAY OF THE CHEST 04/21/2024 02:00:00 PM COMPARISON: Chest radiograph dated 09/13/2021. CLINICAL HISTORY: fatigue fatigue FINDINGS: LUNGS AND PLEURA: No focal pulmonary opacity. No pleural effusion. No pneumothorax. HEART AND MEDIASTINUM: No acute abnormality of the cardiac and mediastinal silhouettes. BONES AND SOFT TISSUES: Degenerative changes of the spine. Atherosclerotic calcification of the uterus. IMPRESSION: 1. No acute findings. Electronically signed by: Vanetta Chou MD 04/21/2024 02:30 PM EST RP Workstation: [id number]   CT HEAD WO CONTRAST Result Date: 04/19/2024 EXAM: CT HEAD WITHOUT CONTRAST 04/19/2024 07:44:05 PM TECHNIQUE: CT of the head was performed without the administration of intravenous contrast. Automated exposure control, iterative reconstruction, and/or weight based adjustment of the mA/kV was utilized to reduce the radiation dose to as low as reasonably achievable. COMPARISON: None available. CLINICAL HISTORY: Vertigo, central. FINDINGS: BRAIN AND VENTRICLES: No acute hemorrhage. No evidence of acute infarct. No hydrocephalus. No extra-axial collection. No mass effect or midline shift.  Scattered periventricular and subcortical white matter low-density changes compatible with chronic microvascular ischemic change. Mild diffuse cerebral volume loss. ORBITS: No acute abnormality. SINUSES: No acute abnormality. SOFT TISSUES AND SKULL: No acute soft tissue abnormality. No skull fracture. Atherosclerotic calcifications in skull base large vessels. IMPRESSION: 1. No acute intracranial abnormality. Electronically signed by: Franky Stanford MD 04/19/2024 08:41 PM EST RP Workstation: [id number]     Procedures   Medications Ordered in the ED  acetaminophen  (TYLENOL ) tablet 1,000 mg (1,000 mg Oral Given 04/21/24 1314)  lactated ringers bolus 500 mL ( Intravenous Stopped 04/21/24 1440)  meclizine (ANTIVERT) tablet 25 mg (25 mg Oral Given 04/21/24 1654)  potassium chloride  SA (KLOR-CON  M) CR tablet 40 mEq (40 mEq Oral Given 04/21/24 1740)  ondansetron (ZOFRAN) injection 4 mg (4 mg Intravenous Given 04/21/24 1806)  cephALEXin  (KEFLEX ) capsule 500 mg (500 mg Oral Given 04/21/24 1807)    Clinical Course as of 04/21/24 1848  Wed Apr 21, 2024  1310 3 days of fatigue, deceased intake, weak [WF]    Clinical Course User Index [WF] Neldon Hamp RAMAN, [place]                                 Medical Decision Making Amount and/or Complexity of Data Reviewed Labs: ordered. Radiology: ordered.  Risk OTC drugs. Prescription drug management.   This patient presents to the ED for concern of dizziness, this involves a number of treatment options, and is a complaint that carries with it a moderate risk of complications and morbidity. A differential diagnosis was considered for the patient's symptoms which is discussed below:   The differential diagnosis of weakness/dizziness includes but is not limited to neurologic causes (GBS, myasthenia gravis, CVA, MS, ALS, transverse myelitis, spinal cord injury, CVA, botulism, ) and other causes: ACS, Arrhythmia, syncope, orthostatic hypotension, sepsis,  hypoglycemia, electrolyte disturbance, hypothyroidism, respiratory failure, symptomatic anemia, dehydration, heat injury, polypharmacy, malignancy.    Co morbidities: Discussed in HPI   Brief History:  Patient is an 83 year old female with a past medical history significant for HTN, reflux, BPPV, peripheral  vascular disease  Patient presents emergency room today with complaint of some dizziness, fatigue, vomiting since 12/29 she actually came to the emergency room that day but left without being seen.  She denies any chest pain or difficulty breathing headaches fevers or shortness of breath.  She indicates that yesterday she had a moment of chest pain that resolved outside without any medication.  She denies any other symptoms.    EMR reviewed including pt PMHx, past surgical history and past visits to ER.   See HPI for more details   Lab Tests:   I ordered and independently interpreted labs. Labs notable for Troponin undetectable, CMP with mild hypokalemia repleted here p.o., CBC without leukocytosis or anemia urinalysis with many bacteria positive for nitrates and leukocytes concerning for UTI.  Imaging Studies:  NAD. I personally reviewed all imaging studies and no acute abnormality found. I agree with radiology interpretation. Chest x-ray from today and CT head from 2 days ago unremarkable   Cardiac Monitoring:  The patient was maintained on a cardiac monitor.  I personally viewed and interpreted the cardiac monitored which showed an underlying rhythm of: NSR EKG non-ischemic   Medicines ordered:  I ordered medication including Tylenol , lactated Ringer's, Keflex , meclizine, Zofran, potassium for hydration, dizziness, antibiosis Reevaluation of the patient after these medicines showed that the patient improved I have reviewed the patients home medicines and have made adjustments as needed   Critical Interventions:     Consults/Attending Physician   I  discussed this case with my attending physician who cosigned this note including patient's presenting symptoms, physical exam, and planned diagnostics and interventions. Attending physician stated agreement with plan or made changes to plan which were implemented.   Reevaluation:  After the interventions noted above I re-evaluated patient and found that they have : Improved   Social Determinants of Health:      Problem List / ED Course:  Patient is an 83 year old female with a UTI and some vertiginous symptoms concerning for peripheral vertigo.  Seems that she has a history of similar.  Provide her of some medications here and she feels much improved especially after some fluids.  Will treat for UTI given UA and her generalized weakness.  She will follow-up with primary care regarding this.  Return precautions emergency room provided.   Dispostion:  After consideration of the diagnostic results and the patients response to treatment, I feel that the patent would benefit from close outpatient follow-up.    Final diagnoses:  Dehydration  Hypokalemia  Acute cystitis without hematuria    ED Discharge Orders          Ordered    ondansetron (ZOFRAN-ODT) 4 MG disintegrating tablet  Every 8 hours PRN        04/21/24 1759    cephALEXin  (KEFLEX ) 500 MG capsule  4 times daily        04/21/24 1759    meclizine (ANTIVERT) 25 MG tablet  3 times daily PRN        04/21/24 1800               Neldon Hamp RAMAN, [place] 04/21/24 1849  "

## 2024-04-21 NOTE — Discharge Instructions (Addendum)
 Drink plenty of water, take Tylenol  1000 mg every 6 hours as needed for body aches, pain.  Follow-up with your primary care doctor.  Take the antibiotics as prescribed.  Take for the entire course even if your symptoms improved/resolved.

## 2024-04-21 NOTE — ED Triage Notes (Addendum)
 Reports dizziness, fatigue, emesis since 12/29. Seen in ED on 12/29  Also reports mid chest pain and L arm tingling yesterday   Denies headaches, fevers, SHOB

## 2024-04-24 LAB — URINE CULTURE: Culture: >=100000 — AB

## 2024-04-25 ENCOUNTER — Telehealth (HOSPITAL_BASED_OUTPATIENT_CLINIC_OR_DEPARTMENT_OTHER): Payer: Self-pay | Admitting: *Deleted

## 2024-04-25 NOTE — Telephone Encounter (Signed)
 Post ED Visit - Positive Culture Follow-up  Culture report reviewed by antimicrobial stewardship pharmacist: [facility] []  Rankin Dee, Pharm.D. []  Venetia Gully, Pharm.D., BCPS AQ-ID []  Garrel Crews, Pharm.D., BCPS []  Almarie Lunger, Pharm.D., BCPS []  [place], [address].D., BCPS, AAHIVP []  Rosaline Bihari, Pharm.D., BCPS, AAHIVP []  Vernell Meier, PharmD, BCPS []  Latanya Hint, PharmD, BCPS []  Donald Medley, PharmD, BCPS []  Rocky Bold, PharmD []  Dorothyann Alert, PharmD, BCPS [x]  Dorn Buttner, PharmD  [facility] []  Rosaline Edison, PharmD []  Romona Bliss, PharmD []  Dolphus Roller, PharmD []  Veva Seip, Rph []  Vernell Daunt) Leonce, PharmD []  Eva Allis, PharmD []  Rosaline Millet, PharmD []  Iantha Batch, PharmD []  Arvin Gauss, PharmD []  Wanda Hasting, PharmD []  Ronal Rav, PharmD []  Rocky Slade, PharmD []  Bard Jeans, PharmD   Positive urine culture Treated with Cephalexin , organism sensitive to the same and no further patient follow-up is required at this time.  Crystal Preston 04/25/2024, 1:59 PM
